# Patient Record
Sex: Female | Born: 1965 | Race: Black or African American | Hispanic: No | Marital: Single | Smoking: Never smoker
Health system: Southern US, Community
[De-identification: ages and names within clinical notes are randomized; demographics above are authoritative.]

## PROBLEM LIST (undated history)

## (undated) DIAGNOSIS — IMO0001 Reserved for inherently not codable concepts without codable children: Secondary | ICD-10-CM

## (undated) DIAGNOSIS — G9332 Myalgic encephalomyelitis/chronic fatigue syndrome: Secondary | ICD-10-CM

## (undated) DIAGNOSIS — H539 Unspecified visual disturbance: Secondary | ICD-10-CM

## (undated) DIAGNOSIS — M7989 Other specified soft tissue disorders: Secondary | ICD-10-CM

## (undated) DIAGNOSIS — M2550 Pain in unspecified joint: Secondary | ICD-10-CM

## (undated) DIAGNOSIS — M79606 Pain in leg, unspecified: Secondary | ICD-10-CM

## (undated) DIAGNOSIS — M549 Dorsalgia, unspecified: Secondary | ICD-10-CM

## (undated) DIAGNOSIS — I10 Essential (primary) hypertension: Secondary | ICD-10-CM

## (undated) DIAGNOSIS — E7800 Pure hypercholesterolemia, unspecified: Secondary | ICD-10-CM

## (undated) DIAGNOSIS — K769 Liver disease, unspecified: Secondary | ICD-10-CM

## (undated) DIAGNOSIS — F32A Depression, unspecified: Secondary | ICD-10-CM

## (undated) DIAGNOSIS — G4730 Sleep apnea, unspecified: Secondary | ICD-10-CM

## (undated) HISTORY — DX: Sleep apnea, unspecified: G47.30

## (undated) HISTORY — PX: KNEE SURGERY: SHX244

## (undated) HISTORY — DX: Myalgic encephalomyelitis/chronic fatigue syndrome: G93.32

## (undated) HISTORY — DX: Unspecified visual disturbance: H53.9

## (undated) HISTORY — DX: Other specified soft tissue disorders: M79.89

## (undated) HISTORY — DX: Depression, unspecified: F32.A

## (undated) HISTORY — DX: Dorsalgia, unspecified: M54.9

## (undated) HISTORY — DX: Pain in unspecified joint: M25.50

## (undated) HISTORY — DX: Pure hypercholesterolemia, unspecified: E78.00

## (undated) HISTORY — PX: BREAST SURGERY: SHX581

## (undated) HISTORY — DX: Liver disease, unspecified: K76.9

## (undated) HISTORY — PX: INCONTINENCE SURGERY: SHX676

---

## 2009-09-08 ENCOUNTER — Emergency Department (HOSPITAL_BASED_OUTPATIENT_CLINIC_OR_DEPARTMENT_OTHER): Admission: EM | Admit: 2009-09-08 | Discharge: 2009-09-08 | Payer: Self-pay | Admitting: Emergency Medicine

## 2009-09-09 ENCOUNTER — Inpatient Hospital Stay (HOSPITAL_COMMUNITY): Admission: AD | Admit: 2009-09-09 | Discharge: 2009-09-09 | Payer: Self-pay | Admitting: Obstetrics & Gynecology

## 2009-09-09 ENCOUNTER — Ambulatory Visit: Payer: Self-pay | Admitting: Physician Assistant

## 2009-12-06 ENCOUNTER — Emergency Department (HOSPITAL_BASED_OUTPATIENT_CLINIC_OR_DEPARTMENT_OTHER): Admission: EM | Admit: 2009-12-06 | Discharge: 2009-12-06 | Payer: Self-pay | Admitting: Emergency Medicine

## 2009-12-06 ENCOUNTER — Ambulatory Visit: Payer: Self-pay | Admitting: Diagnostic Radiology

## 2010-07-05 LAB — BASIC METABOLIC PANEL
BUN: 15 mg/dL (ref 6–23)
Calcium: 8.7 mg/dL (ref 8.4–10.5)
Chloride: 107 mEq/L (ref 96–112)
Creatinine, Ser: 1.4 mg/dL — ABNORMAL HIGH (ref 0.4–1.2)
Sodium: 141 mEq/L (ref 135–145)

## 2010-07-08 LAB — URINALYSIS, ROUTINE W REFLEX MICROSCOPIC
Glucose, UA: NEGATIVE mg/dL
Ketones, ur: NEGATIVE mg/dL
Protein, ur: NEGATIVE mg/dL
Urobilinogen, UA: 0.2 mg/dL (ref 0.0–1.0)

## 2010-07-08 LAB — URINE MICROSCOPIC-ADD ON

## 2010-07-08 LAB — WET PREP, GENITAL: Yeast Wet Prep HPF POC: NONE SEEN

## 2010-07-08 LAB — PREGNANCY, URINE: Preg Test, Ur: NEGATIVE

## 2010-07-08 LAB — GC/CHLAMYDIA PROBE AMP, GENITAL: Chlamydia, DNA Probe: NEGATIVE

## 2011-07-31 ENCOUNTER — Other Ambulatory Visit: Payer: Self-pay | Admitting: Family Medicine

## 2011-07-31 DIAGNOSIS — Z1231 Encounter for screening mammogram for malignant neoplasm of breast: Secondary | ICD-10-CM

## 2011-08-19 ENCOUNTER — Ambulatory Visit
Admission: RE | Admit: 2011-08-19 | Discharge: 2011-08-19 | Disposition: A | Discharge status: Home / Self Care | Payer: PRIVATE HEALTH INSURANCE | Source: Ambulatory Visit | Attending: Family Medicine | Admitting: Family Medicine

## 2011-08-19 DIAGNOSIS — Z1231 Encounter for screening mammogram for malignant neoplasm of breast: Secondary | ICD-10-CM

## 2011-10-11 ENCOUNTER — Encounter (HOSPITAL_BASED_OUTPATIENT_CLINIC_OR_DEPARTMENT_OTHER): Payer: Self-pay | Admitting: *Deleted

## 2011-10-11 ENCOUNTER — Emergency Department (HOSPITAL_BASED_OUTPATIENT_CLINIC_OR_DEPARTMENT_OTHER)
Admission: EM | Admit: 2011-10-11 | Discharge: 2011-10-11 | Disposition: A | Discharge status: Home / Self Care | Payer: Worker's Compensation | Attending: Emergency Medicine | Admitting: Emergency Medicine

## 2011-10-11 ENCOUNTER — Emergency Department (HOSPITAL_BASED_OUTPATIENT_CLINIC_OR_DEPARTMENT_OTHER): Payer: Worker's Compensation

## 2011-10-11 DIAGNOSIS — IMO0002 Reserved for concepts with insufficient information to code with codable children: Secondary | ICD-10-CM | POA: Insufficient documentation

## 2011-10-11 DIAGNOSIS — W108XXA Fall (on) (from) other stairs and steps, initial encounter: Secondary | ICD-10-CM | POA: Insufficient documentation

## 2011-10-11 DIAGNOSIS — S8390XA Sprain of unspecified site of unspecified knee, initial encounter: Secondary | ICD-10-CM

## 2011-10-11 DIAGNOSIS — I10 Essential (primary) hypertension: Secondary | ICD-10-CM | POA: Insufficient documentation

## 2011-10-11 DIAGNOSIS — Y990 Civilian activity done for income or pay: Secondary | ICD-10-CM | POA: Insufficient documentation

## 2011-10-11 HISTORY — DX: Essential (primary) hypertension: I10

## 2011-10-11 HISTORY — DX: Reserved for inherently not codable concepts without codable children: IMO0001

## 2011-10-11 HISTORY — DX: Pain in leg, unspecified: M79.606

## 2011-10-11 MED ORDER — OXYCODONE-ACETAMINOPHEN 5-325 MG PO TABS
1.0000 | ORAL_TABLET | Freq: Once | ORAL | Status: AC
  Administered 2011-10-11: 1 via ORAL
  Filled 2011-10-11: qty 1

## 2011-10-11 MED ORDER — IBUPROFEN 800 MG PO TABS: 800.0000 mg | ORAL_TABLET | Freq: Three times a day (TID) | ORAL | Status: AC | PRN

## 2011-10-11 MED ORDER — IBUPROFEN 800 MG PO TABS
800.0000 mg | ORAL_TABLET | Freq: Once | ORAL | Status: AC
  Administered 2011-10-11: 800 mg via ORAL
  Filled 2011-10-11: qty 1

## 2011-10-11 MED ORDER — OXYCODONE-ACETAMINOPHEN 5-325 MG PO TABS: 1.0000 | ORAL_TABLET | Freq: Once | ORAL | Status: DC

## 2011-10-11 MED ORDER — IBUPROFEN 800 MG PO TABS: 800.0000 mg | ORAL_TABLET | Freq: Once | ORAL | Status: DC

## 2011-10-11 MED ORDER — OXYCODONE-ACETAMINOPHEN 5-325 MG PO TABS: 1.0000 | ORAL_TABLET | Freq: Four times a day (QID) | ORAL | Status: AC | PRN

## 2011-10-11 NOTE — Discharge Instructions (Signed)
Knee Sprain  You have a knee sprain. Sprains are painful injuries to the joints. A sprain is a partial or complete tearing of ligaments. Ligaments are tough, fibrous tissues that hold bones together at the joints. A strain (sprain) has occurred when a ligament is stretched or damaged. This injury may take several weeks to heal. This is often the same length of time as a bone fracture (break in bone) takes to heal. Even though a fracture (bone break) may not have occurred, the recovery times may be similar.  HOME CARE INSTRUCTIONS   · Rest the injured area for as long as directed by your caregiver. Then slowly start using the joint as directed by your caregiver and as the pain allows. Use crutches as directed. If the knee was splinted or casted, continue use and care as directed. If an ace bandage has been applied today, it should be removed and reapplied every 3 to 4 hours. It should not be applied tightly, but firmly enough to keep swelling down. Watch toes and feet for swelling, bluish discoloration, coldness, numbness or excessive pain. If any of these symptoms occur, remove the ace bandage and reapply more loosely.If these symptoms persist, seek medical attention.  · For the first 24 hours, lie down. Keep the injured extremity elevated on two pillows.  · Apply ice to the injured area for 15 to 20 minutes every couple hours. Repeat this 3 to 4 times per day for the first 48 hours. Put the ice in a plastic bag and place a towel between the bag of ice and your skin.  · Wear any splinting, casting, or elastic bandage applications as instructed.  · Only take over-the-counter or prescription medicines for pain, discomfort, or fever as directed by your caregiver. Do not use aspirin immediately after the injury unless instructed by your caregiver. Aspirin can cause increased bleeding and bruising of the tissues.  · If you were given crutches, continue to use them as instructed. Do not resume weight bearing on the  affected extremity until instructed.  Persistent pain and inability to use the injured area as directed for more than 2 to 3 days are warning signs. If this happens you should see a caregiver for a follow-up visit as soon as possible. Initially, a hairline fracture (this is the same as a broken bone) may not be evident on x-rays. Persistent pain and swelling indicate that further evaluation, non-weight bearing (use of crutches as instructed), and/or further x-rays are indicated. X-rays may sometimes not show a small fracture until a week or ten days later. Make a follow-up appointment with your own caregiver or one to whom we have referred you. A radiologist (specialist in reading x-rays) may re-read your X-rays. Make sure you know how you are to get your x-ray results. Do not assume everything is normal if you do not hear from us.  SEEK MEDICAL CARE IF:   · Bruising, swelling, or pain increases.  · You have cold or numb toes  · You have continuing difficulty or pain with walking.  SEEK IMMEDIATE MEDICAL CARE IF:   · Your toes are cold, numb or blue.  · The pain is not responding to medications and continues to stay the same or get worse.  MAKE SURE YOU:   · Understand these instructions.  · Will watch your condition.  · Will get help right away if you are not doing well or get worse.  Document Released: 04/07/2005 Document Revised: 03/27/2011 Document Reviewed: 03/22/2007    [employer]® Patient Information ©2012 [employer].

## 2011-10-11 NOTE — ED Provider Notes (Signed)
History     CSN: [id number]  Arrival date & time 10/11/11  1318   First MD Initiated Contact with Patient 10/11/11 1341      Chief Complaint  Patient presents with  . Fall    (Consider location/radiation/quality/duration/timing/severity/associated sxs/prior treatment) HPI Pt reports she fell at work early this morning, slipped on some water and fell down 3 steps. She denies head injury or LOC, but states she injured her L arm and leg. She went to sleep when she got home from work and woke up with moderate aching pain in L knee and L arm, worse with movement. No neck or back pain.   Past Medical History  Diagnosis Date  . Hypertension   . Leg pain   . Nerves     Past Surgical History  Procedure Date  . Breast surgery   . Knee surgery     History reviewed. No pertinent family history.  History  Substance Use Topics  . Smoking status: Never Smoker   . Smokeless tobacco: Not on file  . Alcohol Use: No    OB History    Grav Para Term Preterm Abortions TAB SAB Ect Mult Living                  Review of Systems All other systems reviewed and are negative except as noted in HPI.   Allergies  Review of patient's allergies indicates no known allergies.  Home Medications   Current Outpatient Rx  Name Route Sig Dispense Refill  . GABAPENTIN 300 MG PO CAPS Oral Take 300 mg by mouth 3 (three) times daily.    Marland Kitchen LISINOPRIL 10 MG PO TABS Oral Take 10 mg by mouth daily.      BP 120/70  Pulse 68  Temp 98.1 F (36.7 C) (Oral)  Resp 20  Ht 5\' 6"  (1.676 m)  Wt 283 lb (128.368 kg)  BMI 45.68 kg/m2  SpO2 100%  LMP 10/08/2011  Physical Exam  Nursing note and vitals reviewed. Constitutional: She is oriented to person, place, and time. She appears well-developed and well-nourished.  HENT:  Head: Normocephalic and atraumatic.  Eyes: EOM are normal. Pupils are equal, round, and reactive to light.  Neck: Normal range of motion. Neck supple.       nontender to midline    Cardiovascular: Normal rate, normal heart sounds and intact distal pulses.   Pulmonary/Chest: Effort normal and breath sounds normal.  Abdominal: Bowel sounds are normal. She exhibits no distension. There is no tenderness.  Musculoskeletal: She exhibits tenderness. She exhibits no edema.       LUE with no evidence of bony injury, FROM, no swelling or bruising; L knee is tender to palpation with decreased ROM.   Neurological: She is alert and oriented to person, place, and time. She has normal strength. No cranial nerve deficit or sensory deficit.  Skin: Skin is warm and dry. No rash noted.  Psychiatric: She has a normal mood and affect.    ED Course  Procedures (including critical care time)  Labs Reviewed - No data to display Dg Knee Complete 4 Views Left  10/11/2011  *RADIOLOGY REPORT*  Clinical Data: Fall with left knee pain.  LEFT KNEE - COMPLETE 4+ VIEW  Comparison: None  Findings: No evidence of acute fracture, subluxation or dislocation identified.  No joint effusion noted.  No radio-opaque foreign bodies are present.  No focal bony lesions are noted.  The joint spaces are unremarkable.  IMPRESSION: No acute abnormality.  Original Report Authenticated By: Rosendo Gros, M.D.     No diagnosis found.    MDM  Xray as above, neg for fracture. Will attempt to fit knee immobilizer and if not will ACE wrap. Advised pain meds, rest PCP followup.         Maelyn Berrey B. Bernette Mayers, MD 10/11/11 1459

## 2011-10-11 NOTE — ED Notes (Signed)
Pt states she fell down 3 steps at work. C/O pain on left side.

## 2011-11-07 ENCOUNTER — Encounter (HOSPITAL_BASED_OUTPATIENT_CLINIC_OR_DEPARTMENT_OTHER): Payer: Self-pay | Admitting: *Deleted

## 2011-11-07 ENCOUNTER — Emergency Department (HOSPITAL_BASED_OUTPATIENT_CLINIC_OR_DEPARTMENT_OTHER): Payer: Worker's Compensation

## 2011-11-07 ENCOUNTER — Emergency Department (HOSPITAL_BASED_OUTPATIENT_CLINIC_OR_DEPARTMENT_OTHER)
Admission: EM | Admit: 2011-11-07 | Discharge: 2011-11-07 | Disposition: A | Discharge status: Home / Self Care | Payer: Worker's Compensation | Attending: Emergency Medicine | Admitting: Emergency Medicine

## 2011-11-07 DIAGNOSIS — I10 Essential (primary) hypertension: Secondary | ICD-10-CM | POA: Insufficient documentation

## 2011-11-07 DIAGNOSIS — M25559 Pain in unspecified hip: Secondary | ICD-10-CM | POA: Insufficient documentation

## 2011-11-07 MED ORDER — IBUPROFEN 800 MG PO TABS
800.0000 mg | ORAL_TABLET | Freq: Once | ORAL | Status: AC
  Administered 2011-11-07: 800 mg via ORAL
  Filled 2011-11-07: qty 1

## 2011-11-07 NOTE — ED Notes (Signed)
Left hip pain and swelling in her left foot.

## 2011-11-07 NOTE — ED Provider Notes (Signed)
History     CSN: [id number]  Arrival date & time 11/07/11  1706   First MD Initiated Contact with Patient 11/07/11 1816      Chief Complaint  Patient presents with  . Hip Pain    (Consider location/radiation/quality/duration/timing/severity/associated sxs/prior treatment) HPI Patient with left hip pain. She has had noted left hip pain after an injury to her left knee several weeks ago. She had an immobilizer on her knee and has been walking with a limp and has now noted left hip pain over the past week and a half. It occurs daily. It is painful with certain movements. It is not swollen and there is no erythema or other injury. She states that she has had some swelling of her left foot over the past 24 hours per Past Medical History  Diagnosis Date  . Hypertension   . Leg pain   . Nerves     Past Surgical History  Procedure Date  . Breast surgery   . Knee surgery     No family history on file.  History  Substance Use Topics  . Smoking status: Never Smoker   . Smokeless tobacco: Not on file  . Alcohol Use: No    OB History    Grav Para Term Preterm Abortions TAB SAB Ect Mult Living                  Review of Systems  All other systems reviewed and are negative.    Allergies  Review of patient's allergies indicates no known allergies.  Home Medications   Current Outpatient Rx  Name Route Sig Dispense Refill  . ASCORBIC ACID 500 MG PO TABS Oral Take 500 mg by mouth daily.    Marland Kitchen BIOTIN 10 MG PO CAPS Oral Take 1 tablet by mouth daily.    Marland Kitchen GABAPENTIN 300 MG PO CAPS Oral Take 300 mg by mouth 3 (three) times daily.    . IBUPROFEN 200 MG PO TABS Oral Take 400 mg by mouth every 6 (six) hours as needed. For pain.    Marland Kitchen LISINOPRIL 10 MG PO TABS Oral Take 10 mg by mouth daily.      BP 135/79  Pulse 78  Temp 98.4 F (36.9 C) (Oral)  Resp 20  SpO2 100%  LMP 10/31/2011  Physical Exam  Nursing note and vitals reviewed. Constitutional: She appears well-developed  and well-nourished.  HENT:  Head: Normocephalic and atraumatic.  Eyes: Conjunctivae and EOM are normal. Pupils are equal, round, and reactive to light.  Neck: Normal range of motion. Neck supple.  Cardiovascular: Normal rate, regular rhythm, normal heart sounds and intact distal pulses.   Pulmonary/Chest: Effort normal and breath sounds normal.  Abdominal: Soft. Bowel sounds are normal.  Musculoskeletal: Normal range of motion. She exhibits no edema.       Some tenderness over left hip with palpation but full active range of motion of left hip joint. There is no erythema or induration noted. Left lower extremity and foot without any signs of edema equal to the right lower extremity. There is no tenderness or cords medially in the lower extremity posterior to the knee or in the medial aspect of the left side.  Neurological: She is alert.  Skin: Skin is warm and dry.  Psychiatric: She has a normal mood and affect. Thought content normal.    ED Course  Procedures (including critical care time)  Labs Reviewed - No data to display Dg Hip Complete Left  11/07/2011  *  RADIOLOGY REPORT*  Clinical Data: Left hip pain since a fall 1 month ago.  LEFT HIP - COMPLETE 2+ VIEW  Comparison: None.  Findings: Osseous structures of the left hip are normal.  No joint space narrowing or osteophyte formation.  The patient has sclerosis at the sacroiliac joints and symphysis pubis, probably related childbirth.  IMPRESSION: Normal left hip.  Original Report Authenticated By: Gwynn Burly, M.D.     No diagnosis found.    MDM   Left hip pain appears musculoskeletal in nature. There is no history of direct trauma to this area. There is no apparent swelling of the left foot no clinical findings consistent with DVT. Patient is advised anti-inflammatory medicine and is given referral to follow with Dr. Pearletha Forge.        Hilario Quarry, MD 11/07/11 2020

## 2011-11-14 ENCOUNTER — Ambulatory Visit (INDEPENDENT_AMBULATORY_CARE_PROVIDER_SITE_OTHER): Payer: Worker's Compensation | Admitting: Family Medicine

## 2011-11-14 VITALS — BP 112/71 | HR 68 | Temp 97.9°F | Ht 66.0 in | Wt 283.0 lb

## 2011-11-14 DIAGNOSIS — M545 Low back pain: Secondary | ICD-10-CM

## 2011-11-14 DIAGNOSIS — M79609 Pain in unspecified limb: Secondary | ICD-10-CM

## 2011-11-14 DIAGNOSIS — M79605 Pain in left leg: Secondary | ICD-10-CM

## 2011-11-14 MED ORDER — CYCLOBENZAPRINE HCL 5 MG PO TABS: 5.0000 mg | ORAL_TABLET | Freq: Three times a day (TID) | ORAL | Status: AC | PRN

## 2011-11-14 MED ORDER — MELOXICAM 15 MG PO TABS: 15.0000 mg | ORAL_TABLET | Freq: Every day | ORAL | Status: DC

## 2011-11-14 MED ORDER — PREDNISONE (PAK) 10 MG PO TABS: ORAL_TABLET | ORAL | Status: DC

## 2011-11-14 NOTE — Patient Instructions (Addendum)
You have lumbar radiculopathy (a pinched nerve in your low back). A prednisone dose pack is the best option for immediate relief and may be prescribed with transition to an anti-inflammatory like meloxicam (if you do not have stomach or kidney issues). Start meloxicam day AFTER finishing prednisone. Vicodin as needed for severe pain (no driving on this medicine). Flexeril as needed for muscle spasms (no driving on this medicine if it makes you sleepy). Continue neurontin as you have been - there's room to go up on this medicine also. Stay as active as possible. Start home stretches (pick 3-5 to do every day for next 6 weeks). Physical therapy has been shown to be helpful as well. Strengthening of low back muscles, abdominal musculature are key for long term pain relief. If not improving, will consider further imaging (x-rays and MRI). Follow up with me in 1 month.

## 2011-11-17 ENCOUNTER — Encounter: Payer: Self-pay | Admitting: Family Medicine

## 2011-11-17 DIAGNOSIS — M79605 Pain in left leg: Secondary | ICD-10-CM | POA: Insufficient documentation

## 2011-11-17 NOTE — Progress Notes (Signed)
Subjective:    Patient ID: Kristen Woods, female    DOB: 23-Mar-1966, 46 y.o.   MRN: [id number]  PCP: [facility]  HPI 46 yo F here for left leg pain.  Patient reports she has a history of left side problems in the past. Then on October 11, 2011 she fell down stairs at work and landed on her left side. This worsened pain in left side, leg, knee. Pain radiates from foot throughout leg mostly posteriorly Associated with some left sided low back pain. No bowel/bladder dysfunction. Does have numbness/tingling with this. Had MRI about 6 years ago of left knee - we do not have access to these results however. Left leg gives out at times. Taking neurontin 300 tid. Feels like left hip laterally and posteriorly 'locks up.' No MRI or injections of back - did have injection of left knee though.  Past Medical History  Diagnosis Date  . Hypertension   . Leg pain   . Nerves     Current Outpatient Prescriptions on File Prior to Visit  Medication Sig Dispense Refill  . ascorbic acid (VITAMIN C) 500 MG tablet Take 500 mg by mouth daily.      . Biotin 10 MG CAPS Take 1 tablet by mouth daily.      Marland Kitchen gabapentin (NEURONTIN) 300 MG capsule Take 300 mg by mouth 3 (three) times daily.      Marland Kitchen lisinopril (PRINIVIL,ZESTRIL) 10 MG tablet Take 10 mg by mouth daily.        Past Surgical History  Procedure Date  . Breast surgery   . Knee surgery     No Known Allergies  History   Social History  . Marital Status: Single    Spouse Name: N/A    Number of Children: N/A  . Years of Education: N/A   Occupational History  . Not on file.   Social History Main Topics  . Smoking status: Never Smoker   . Smokeless tobacco: Not on file  . Alcohol Use: No  . Drug Use: No  . Sexually Active:    Other Topics Concern  . Not on file   Social History Narrative  . No narrative on file    Family History  Problem Relation Age of Onset  . Hypertension Mother   . Diabetes Father   . Heart  attack Neg Hx   . Sudden death Neg Hx   . Hyperlipidemia Other     BP 112/71  Pulse 68  Temp 97.9 F (36.6 C) (Oral)  Ht 5\' 6"  (1.676 m)  Wt 283 lb (128.368 kg)  BMI 45.68 kg/m2  LMP 10/31/2011  Review of Systems See HPI above.    Objective:   Physical Exam Gen: NAD  Back: No gross deformity, scoliosis. TTP mild left paraspinal > right paraspinal lumbar region and left buttock.  No midline or bony TTP.  Some pain with light touch LLE. FROM with pain on flexion and extension (mild). Strength LLE 4/5 with hip flexion, knee flexion/extension, ankle dorsiflexion and plantarflexion.  5/5 all muscle groups RLE.   2+ MSRs in patellar and achilles tendons, equal bilaterally. Negative SLRs - more stretch on left side. Sensation diminished to light touch on left compared to right. Negative logroll bilateral hips Negative fabers and piriformis stretches.  L knee: No gross deformity, ecchymoses, effusion. Diffuse TTP about knee, lower leg. FROM. Negative ant/post drawers. Negative valgus/varus testing. Negative lachmanns. Negative mcmurrays, apleys (both cause pain anterior knee but no click), patellar apprehension.  NV intact distally.     Assessment & Plan:  1. Left leg pain - History and exam most consistent with acute on chronic lumbar radiculopathy.  Distribution of pain throughout entire left lower extremity and pain with light touch may indicate some non-anatomic pain.  Will start with prednisone dose pack and transition to meloxicam. Vicodin as needed severe pain (#60 prescribed q6h prn), flexeril as needed for spasms.  Continue neurontin - keep f/u with PCP for this to continue titrating.  Consider further imaging, PT in future.  Start HEP.  F/u in 1 month for reevaluation.

## 2011-11-17 NOTE — Assessment & Plan Note (Signed)
History and exam most consistent with acute on chronic lumbar radiculopathy.  Distribution of pain throughout entire left lower extremity and pain with light touch may indicate some non-anatomic pain.  Will start with prednisone dose pack and transition to meloxicam. Vicodin as needed severe pain (#60 prescribed q6h prn), flexeril as needed for spasms.  Continue neurontin - keep f/u with PCP for this to continue titrating.  Consider further imaging, PT in future.  Start HEP.  F/u in 1 month for reevaluation.

## 2011-12-15 ENCOUNTER — Ambulatory Visit: Payer: Self-pay | Admitting: Family Medicine

## 2011-12-19 ENCOUNTER — Ambulatory Visit: Payer: Self-pay | Admitting: Family Medicine

## 2012-02-05 ENCOUNTER — Encounter: Payer: Self-pay | Admitting: Family Medicine

## 2012-02-05 ENCOUNTER — Ambulatory Visit (INDEPENDENT_AMBULATORY_CARE_PROVIDER_SITE_OTHER): Payer: Worker's Compensation | Admitting: Family Medicine

## 2012-02-05 VITALS — BP 103/73 | HR 75 | Ht 66.0 in | Wt 280.0 lb

## 2012-02-05 DIAGNOSIS — M25539 Pain in unspecified wrist: Secondary | ICD-10-CM

## 2012-02-05 DIAGNOSIS — M79605 Pain in left leg: Secondary | ICD-10-CM

## 2012-02-05 DIAGNOSIS — M25531 Pain in right wrist: Secondary | ICD-10-CM

## 2012-02-05 DIAGNOSIS — M79609 Pain in unspecified limb: Secondary | ICD-10-CM

## 2012-02-05 NOTE — Patient Instructions (Addendum)
You have lumbar radiculopathy (a pinched nerve in your low back). Meloxicam 15mg  daily with food for pain and inflammation. Percocet as needed for severe pain - we will taper down from this if you need pain medicine when this runs out. Flexeril as needed for muscle spasms. Start formal physical therapy for this and your knee - we will have to run this through your worker's comp. Consider wrist brace for right wrist - wear at nighttime and as much as possible during the day. Follow up with me in 1 month to 6 weeks for reevaluation.

## 2012-02-09 ENCOUNTER — Encounter: Payer: Self-pay | Admitting: Family Medicine

## 2012-02-09 DIAGNOSIS — M25531 Pain in right wrist: Secondary | ICD-10-CM | POA: Insufficient documentation

## 2012-02-09 NOTE — Assessment & Plan Note (Signed)
History and exam most consistent with acute on chronic lumbar radiculopathy.  Distribution of pain throughout entire left lower extremity and pain with light touch (some abnormal withdrawal) may indicate some non-anatomic pain as well.  Not much help with prednisone - will not repeat dose pack.  Continue meloxicam, percocet (#60 prescribed, no refills - will have to taper from this if refill needed), flexeril for spasms.  Start formal physical therapy as this is most likely to help her - wrist for radiculopathy and left knee pain though feel left knee pain is most likely accounted for by radiculopathy.  Continue neurontin - keep f/u with PCP for this to continue titrating.  F/u in 1 month to 6 weeks.  IF not improving consider Lumbar spine MRI.

## 2012-02-09 NOTE — Progress Notes (Addendum)
Subjective:    Patient ID: Kristen Woods, female    DOB: Jul 04, 1965, 46 y.o.   MRN: [id number]  PCP: [facility]  Knee Pain    46 yo F here for f/u left leg pain.  7/26: Patient reports she has a history of left side problems in the past. Then on October 11, 2011 she fell down stairs at work and landed on her left side. This worsened pain in left side, leg, knee. Pain radiates from foot throughout leg mostly posteriorly Associated with some left sided low back pain. No bowel/bladder dysfunction. Does have numbness/tingling with this. Had MRI about 6 years ago of left knee - we do not have access to these results however. Left leg gives out at times. Taking neurontin 300 tid. Feels like left hip laterally and posteriorly 'locks up.' No MRI or injections of back - did have injection of left knee though.  10/17: Patient returns stating about the same but past 4 days really bad muscle spasms in back and left leg. Left knee feels tight as well. No new injuries since the one in June noted above. 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 States is taking gabapentin, mobic.  Did not notice much benefit with prednisone. A pins sensation occasionally in left leg. Also reports past week started having numbness and pain in fingers of right hand not related to work.  Past Medical History  Diagnosis Date  . Hypertension   . Leg pain   . Nerves     Current Outpatient Prescriptions on File Prior to Visit  Medication Sig Dispense Refill  . ascorbic acid (VITAMIN C) 500 MG tablet Take 500 mg by mouth daily.      . Biotin 10 MG CAPS Take 1 tablet by mouth daily.      Marland Kitchen gabapentin (NEURONTIN) 300 MG capsule Take 300 mg by mouth 3 (three) times daily.      Marland Kitchen lisinopril (PRINIVIL,ZESTRIL) 10 MG tablet Take 10 mg by mouth daily.      . meloxicam (MOBIC) 15 MG tablet Take 1 tablet (15 mg total) by mouth daily. With food.  Start AFTER finishing prednisone.  30 tablet  1  .  predniSONE (STERAPRED UNI-PAK) 10 MG tablet 6 tabs po day 1, 5 tabs po day 2, 4 tabs po day 3, 3 tabs po day 4, 2 tabs po day 5, 1 tab po day 6  21 tablet  0    Past Surgical History  Procedure Date  . Breast surgery   . Knee surgery     No Known Allergies  History   Social History  . Marital Status: Single    Spouse Name: N/A    Number of Children: N/A  . Years of Education: N/A   Occupational History  . Not on file.   Social History Main Topics  . Smoking status: Never Smoker   . Smokeless tobacco: Not on file  . Alcohol Use: No  . Drug Use: No  . Sexually Active: Not on file   Other Topics Concern  . Not on file   Social History Narrative  . No narrative on file    Family History  Problem Relation Age of Onset  . Hypertension Mother   . Diabetes Father   . Heart attack Neg Hx   . Sudden death Neg Hx   . Hyperlipidemia Other     BP 103/73  Pulse 75  Ht 5\' 6"  (1.676 m)  Wt 280 lb (127.007 kg)  BMI 45.19 kg/m2  Review of Systems  See HPI above.    Objective:   Physical Exam  Gen: NAD  Back: No gross deformity, scoliosis. TTP mild left paraspinal > right paraspinal lumbar region and left buttock.  No midline or bony TTP.  Some pain with light touch LLE - some abnormal withdrawal. FROM with pain on flexion and extension (mild). Strength LLE 5/5 all muscle groups, improved compared to last visit.  5/5 all muscle groups RLE.   2+ MSRs in patellar and achilles tendons, equal bilaterally. Negative SLRs - more stretch on left side. Sensation normal bilaterally. Negative logroll bilateral hips Negative fabers and piriformis stretches.  L knee: No gross deformity, ecchymoses, effusion. Diffuse TTP about knee, lower leg. FROM. Negative ant/post drawers. Negative valgus/varus testing. Negative lachmanns. Negative mcmurrays, apleys (both cause pain anterior knee but no click), patellar apprehension. NV intact distally.  R hand/wrist: No gross  deformity, swelling, bruising, atrophy. No focal TTP. FROM all digits, wrist. Decreased sensation throughout hand and fingers. Negative tinels at carpal tunnel and guyons canal.    Assessment & Plan:  1. Left leg pain - History and exam most consistent with acute on chronic lumbar radiculopathy.  Distribution of pain throughout entire left lower extremity and pain with light touch (some abnormal withdrawal) may indicate some non-anatomic pain as well.  Not much help with prednisone - will not repeat dose pack.  Continue meloxicam, percocet (#60 prescribed, no refills - will have to taper from this if refill needed), flexeril for spasms.  Start formal physical therapy as this is most likely to help her - wrist for radiculopathy and left knee pain though feel left knee pain is most likely accounted for by radiculopathy.  Continue neurontin - keep f/u with PCP for this to continue titrating.  F/u in 1 month to 6 weeks.  IF not improving consider Lumbar spine MRI.  2. Right wrist/hand pain - does not fit with a specific nerve distribution.  Will treat conservatively with cockup wrist splint as would treat carpal tunnel or guyon canal compression.  Addendum 11/18:  Patient called requesting pain medication - sent in one time script of tramadol.  She is also due for follow-up appointment.

## 2012-02-09 NOTE — Assessment & Plan Note (Signed)
does not fit with a specific nerve distribution.  Will treat conservatively with cockup wrist splint as would treat carpal tunnel or guyon canal compression.

## 2012-03-08 MED ORDER — TRAMADOL HCL 50 MG PO TABS: 50.0000 mg | ORAL_TABLET | Freq: Three times a day (TID) | ORAL | Status: DC | PRN

## 2012-03-08 NOTE — Addendum Note (Signed)
Addended by: Lenda Kelp on: 03/08/2012 03:50 PM   Modules accepted: Orders

## 2012-03-12 ENCOUNTER — Ambulatory Visit (INDEPENDENT_AMBULATORY_CARE_PROVIDER_SITE_OTHER): Payer: Worker's Compensation | Admitting: Family Medicine

## 2012-03-12 VITALS — BP 135/84 | Ht 66.0 in | Wt 280.0 lb

## 2012-03-12 DIAGNOSIS — M79609 Pain in unspecified limb: Secondary | ICD-10-CM

## 2012-03-12 DIAGNOSIS — M545 Low back pain: Secondary | ICD-10-CM

## 2012-03-12 DIAGNOSIS — M79605 Pain in left leg: Secondary | ICD-10-CM

## 2012-03-12 MED ORDER — GABAPENTIN 100 MG PO CAPS: 300.0000 mg | ORAL_CAPSULE | Freq: Three times a day (TID) | ORAL | Status: DC

## 2012-03-12 NOTE — Patient Instructions (Addendum)
We will increase your gabapentin to 300mg  three times a day - start by taking 300mg  twice a day for a few days then increase to 3x/day. Take tramadol as needed for pain, flexeril as needed for spasms. If they call you for physical therapy you can start this. We will order an MRI on your lumbar spine but this also has to go through worker's comp. If you don't hear from me within a couple days of you having your MRI, call me - sometimes I won't get the report when it's done this way and we can then track it down.

## 2012-03-22 ENCOUNTER — Encounter: Payer: Self-pay | Admitting: Family Medicine

## 2012-03-22 NOTE — Progress Notes (Addendum)
Subjective:    Patient ID: Kristen Woods, female    DOB: [birth year], 46 y.o.   MRN: [id number]  PCP: [facility]  Leg Pain   Knee Pain    46 yo F here for f/u left leg pain.  7/26: Patient reports she has a history of left side problems in the past. Then on October 11, 2011 she fell down stairs at work and landed on her left side. This worsened pain in left side, leg, knee. Pain radiates from foot throughout leg mostly posteriorly Associated with some left sided low back pain. No bowel/bladder dysfunction. Does have numbness/tingling with this. Had MRI about 6 years ago of left knee - we do not have access to these results however. Left leg gives out at times. Taking neurontin 300 tid. Feels like left hip laterally and posteriorly 'locks up.' No MRI or injections of back - did have injection of left knee though.  10/17: Patient returns stating about the same but past 4 days really bad muscle spasms in back and left leg. Left knee feels tight as well. No new injuries since the one in June noted above. 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 States is taking gabapentin, mobic.  Did not notice much benefit with prednisone. A pins sensation occasionally in left leg. Also reports past week started having numbness and pain in fingers of right hand not related to work.  11/22: Patient currently is taking gabapentin 300mg  a day, mobic as needed. As noted previously, not much benefit with prednisone. Has still not heard from workers comp on physical therapy. Taking tramadol as needed. Severe spasms left side of low back down left leg. Feels like low back locking up at times. Pins sensation noted previously also.  Past Medical History  Diagnosis Date  . Hypertension   . Leg pain   . Nerves     Current Outpatient Prescriptions on File Prior to Visit  Medication Sig Dispense Refill  . ascorbic acid (VITAMIN C) 500 MG tablet Take 500 mg by mouth daily.       . Biotin 10 MG CAPS Take 1 tablet by mouth daily.      Marland Kitchen gabapentin (NEURONTIN) 100 MG capsule Take 3 capsules (300 mg total) by mouth 3 (three) times daily.  810 capsule  1  . lisinopril (PRINIVIL,ZESTRIL) 10 MG tablet Take 10 mg by mouth daily.      . meloxicam (MOBIC) 15 MG tablet Take 1 tablet (15 mg total) by mouth daily. With food.  Start AFTER finishing prednisone.  30 tablet  1  . predniSONE (STERAPRED UNI-PAK) 10 MG tablet 6 tabs po day 1, 5 tabs po day 2, 4 tabs po day 3, 3 tabs po day 4, 2 tabs po day 5, 1 tab po day 6  21 tablet  0  . traMADol (ULTRAM) 50 MG tablet Take 1 tablet (50 mg total) by mouth every 8 (eight) hours as needed for pain.  90 tablet  0    Past Surgical History  Procedure Date  . Breast surgery   . Knee surgery     No Known Allergies  History   Social History  . Marital Status: Single    Spouse Name: N/A    Number of Children: N/A  . Years of Education: N/A   Occupational History  . Not on file.   Social History Main Topics  . Smoking status: Never Smoker   . Smokeless tobacco: Not  on file  . Alcohol Use: No  . Drug Use: No  . Sexually Active: Not on file   Other Topics Concern  . Not on file   Social History Narrative  . No narrative on file    Family History  Problem Relation Age of Onset  . Hypertension Mother   . Diabetes Father   . Heart attack Neg Hx   . Sudden death Neg Hx   . Hyperlipidemia Other     BP 135/84  Ht 5\' 6"  (1.676 m)  Wt 280 lb (127.007 kg)  BMI 45.19 kg/m2  Review of Systems  See HPI above.    Objective:   Physical Exam  Gen: NAD  Back: No gross deformity, scoliosis. TTP mild left paraspinal > right paraspinal lumbar region and left buttock.  No midline or bony TTP.   FROM with pain on flexion and extension (mild). Strength LLE 5/5 all muscle groups.  5/5 all muscle groups RLE.   2+ MSRs in patellar and achilles tendons, equal bilaterally. Mild positive SLR on left, negative  right. Sensation normal bilaterally. Negative logroll bilateral hips Negative fabers and piriformis stretches.  L knee: No gross deformity, ecchymoses, effusion. Diffuse TTP about knee, lower leg. FROM. Negative ant/post drawers. Negative valgus/varus testing. Negative lachmanns. Negative mcmurrays, apleys, patellar apprehension. NV intact distally.     Assessment & Plan:  1. Back/Left leg pain - Still believe history and exam most consistent with acute on chronic lumbar radiculopathy.  Has persisted despite HEP, prednisone, meloxicam, pain medication, flexeril.  Worsening recently as well.  Advised we go ahead with MRI lumbar spine to further assess, consider ESIs, PT, surgical intervention depending on results.  Increase neurontin to 300 tid.  Addendum 1/9:  Spoke with patient to see how she's doing.  She reports some improvement since our last discussion.  MRI was normal of her lumbar spine suggesting more piriformis, sciatica as cause of her pain.  Has not heard yet about physical therapy from worker's comp so will check on this.  Also advised to continue working with her PCP on titrating up neurontin.

## 2012-03-22 NOTE — Assessment & Plan Note (Signed)
Still believe history and exam most consistent with acute on chronic lumbar radiculopathy.  Has persisted despite HEP, prednisone, meloxicam, pain medication, flexeril.  Worsening recently as well.  Advised we go ahead with MRI lumbar spine to further assess, consider ESIs, PT, surgical intervention depending on results.  Increase neurontin to 300 tid.

## 2012-03-23 ENCOUNTER — Encounter: Payer: Self-pay | Admitting: *Deleted

## 2012-03-23 NOTE — Patient Instructions (Signed)
MRI REQUEST FOR WORKERS COMP WAS FAXED TO [phone]. PHONE NUMBER [phone]. THEY WILL CONTACT PT WITH APPT TIME AND DATE

## 2012-04-03 ENCOUNTER — Ambulatory Visit (HOSPITAL_BASED_OUTPATIENT_CLINIC_OR_DEPARTMENT_OTHER)
Admission: RE | Admit: 2012-04-03 | Discharge: 2012-04-03 | Disposition: A | Discharge status: Home / Self Care | Payer: Worker's Compensation | Source: Ambulatory Visit | Attending: Family Medicine | Admitting: Family Medicine

## 2012-04-03 DIAGNOSIS — M79609 Pain in unspecified limb: Secondary | ICD-10-CM | POA: Insufficient documentation

## 2012-04-03 DIAGNOSIS — R209 Unspecified disturbances of skin sensation: Secondary | ICD-10-CM | POA: Insufficient documentation

## 2012-04-03 DIAGNOSIS — M545 Low back pain, unspecified: Secondary | ICD-10-CM | POA: Insufficient documentation

## 2012-04-03 DIAGNOSIS — M25559 Pain in unspecified hip: Secondary | ICD-10-CM | POA: Insufficient documentation

## 2012-04-03 DIAGNOSIS — M79605 Pain in left leg: Secondary | ICD-10-CM

## 2012-04-19 ENCOUNTER — Encounter (HOSPITAL_BASED_OUTPATIENT_CLINIC_OR_DEPARTMENT_OTHER): Payer: Self-pay

## 2012-04-19 ENCOUNTER — Emergency Department (HOSPITAL_BASED_OUTPATIENT_CLINIC_OR_DEPARTMENT_OTHER)
Admission: EM | Admit: 2012-04-19 | Discharge: 2012-04-19 | Disposition: A | Discharge status: Home / Self Care | Payer: Self-pay | Attending: Emergency Medicine | Admitting: Emergency Medicine

## 2012-04-19 DIAGNOSIS — I10 Essential (primary) hypertension: Secondary | ICD-10-CM | POA: Insufficient documentation

## 2012-04-19 DIAGNOSIS — Z79899 Other long term (current) drug therapy: Secondary | ICD-10-CM | POA: Insufficient documentation

## 2012-04-19 DIAGNOSIS — L539 Erythematous condition, unspecified: Secondary | ICD-10-CM | POA: Insufficient documentation

## 2012-04-19 DIAGNOSIS — E669 Obesity, unspecified: Secondary | ICD-10-CM | POA: Insufficient documentation

## 2012-04-19 DIAGNOSIS — N61 Mastitis without abscess: Secondary | ICD-10-CM | POA: Insufficient documentation

## 2012-04-19 DIAGNOSIS — N611 Abscess of the breast and nipple: Secondary | ICD-10-CM

## 2012-04-19 DIAGNOSIS — Z8669 Personal history of other diseases of the nervous system and sense organs: Secondary | ICD-10-CM | POA: Insufficient documentation

## 2012-04-19 DIAGNOSIS — Z8739 Personal history of other diseases of the musculoskeletal system and connective tissue: Secondary | ICD-10-CM | POA: Insufficient documentation

## 2012-04-19 MED ORDER — LIDOCAINE 4 % EX CREA
TOPICAL_CREAM | CUTANEOUS | Status: AC
  Administered 2012-04-19: 1
  Filled 2012-04-19: qty 5

## 2012-04-19 MED ORDER — MORPHINE SULFATE 4 MG/ML IJ SOLN
4.0000 mg | Freq: Once | INTRAMUSCULAR | Status: AC
  Administered 2012-04-19: 4 mg via INTRAVENOUS
  Filled 2012-04-19: qty 1

## 2012-04-19 MED ORDER — OXYCODONE-ACETAMINOPHEN 5-325 MG PO TABS: 1.0000 | ORAL_TABLET | Freq: Four times a day (QID) | ORAL | Status: DC | PRN

## 2012-04-19 MED ORDER — LIDOCAINE-PRILOCAINE 2.5-2.5 % EX CREA
TOPICAL_CREAM | Freq: Once | CUTANEOUS | Status: AC
  Administered 2012-04-19: 21:00:00 via TOPICAL
  Filled 2012-04-19: qty 5

## 2012-04-19 MED ORDER — LIDOCAINE HCL 2 % IJ SOLN
INTRAMUSCULAR | Status: AC
  Administered 2012-04-19: 400 mg
  Filled 2012-04-19: qty 20

## 2012-04-19 MED ORDER — SULFAMETHOXAZOLE-TRIMETHOPRIM 800-160 MG PO TABS: 1.0000 | ORAL_TABLET | Freq: Two times a day (BID) | ORAL | Status: AC

## 2012-04-19 MED ORDER — LIDOCAINE-EPINEPHRINE 2 %-1:100000 IJ SOLN
20.0000 mL | Freq: Once | INTRAMUSCULAR | Status: AC
  Administered 2012-04-19: 20 mL via INTRADERMAL

## 2012-04-19 NOTE — ED Notes (Signed)
Swelling, erythema and pain noted to lateral aspect of left breast x 4 days.

## 2012-04-19 NOTE — ED Notes (Signed)
Left axilla abscess x 4 days

## 2012-04-19 NOTE — ED Notes (Signed)
MD at bedside for I and D

## 2012-04-19 NOTE — ED Provider Notes (Signed)
History     CSN: [id number]  Arrival date & time 04/19/12  1658   First MD Initiated Contact with Patient 04/19/12 1826      Chief Complaint  Patient presents with  . Abscess    (Consider location/radiation/quality/duration/timing/severity/associated sxs/prior treatment) HPI Comments: Pt presents with 4 days of painful left breast redness and swelling.  Patient is a 46 y.o. female presenting with abscess. The history is provided by the patient. No language interpreter was used.  Abscess  This is a new problem. The current episode started less than one week ago (4 days). The onset was gradual. The problem occurs continuously. The problem has been gradually worsening. The abscess is present on the torso (left breast). The problem is severe. The abscess is characterized by painfulness, redness and swelling. The abscess first occurred at home. Pertinent negatives include no anorexia, no decrease in physical activity, not sleeping less, not drinking less, no fever, no diarrhea, no congestion, no rhinorrhea, no decreased responsiveness and no cough. Her past medical history does not include skin abscesses in family. There were no sick contacts.    Past Medical History  Diagnosis Date  . Hypertension   . Leg pain   . Nerves     Past Surgical History  Procedure Date  . Breast surgery   . Knee surgery     Family History  Problem Relation Age of Onset  . Hypertension Mother   . Diabetes Father   . Heart attack Neg Hx   . Sudden death Neg Hx   . Hyperlipidemia Other     History  Substance Use Topics  . Smoking status: Never Smoker   . Smokeless tobacco: Not on file  . Alcohol Use: No    OB History    Grav Para Term Preterm Abortions TAB SAB Ect Mult Living                  Review of Systems  Constitutional: Negative for fever and decreased responsiveness.  HENT: Negative for congestion and rhinorrhea.   Respiratory: Negative for cough.   Gastrointestinal: Negative  for diarrhea and anorexia.  All other systems reviewed and are negative.    Allergies  Review of patient's allergies indicates no known allergies.  Home Medications   Current Outpatient Rx  Name  Route  Sig  Dispense  Refill  . ASCORBIC ACID 500 MG PO TABS   Oral   Take 500 mg by mouth daily.         Marland Kitchen BIOTIN 10 MG PO CAPS   Oral   Take 1 tablet by mouth daily.         Marland Kitchen GABAPENTIN 100 MG PO CAPS   Oral   Take 3 capsules (300 mg total) by mouth 3 (three) times daily.   810 capsule   1     If cost is the same, ok to substitute 300mg  capsul ...   . LISINOPRIL 10 MG PO TABS   Oral   Take 10 mg by mouth daily.         . MELOXICAM 15 MG PO TABS   Oral   Take 1 tablet (15 mg total) by mouth daily. With food.  Start AFTER finishing prednisone.   30 tablet   1   . PREDNISONE (PAK) 10 MG PO TABS      6 tabs po day 1, 5 tabs po day 2, 4 tabs po day 3, 3 tabs po day 4, 2 tabs po day  5, 1 tab po day 6   21 tablet   0   . TRAMADOL HCL 50 MG PO TABS   Oral   Take 1 tablet (50 mg total) by mouth every 8 (eight) hours as needed for pain.   90 tablet   0     BP 150/88  Pulse 91  Temp 98.4 F (36.9 C) (Oral)  Resp 16  Ht 5\' 6"  (1.676 m)  Wt 276 lb (125.193 kg)  BMI 44.55 kg/m2  SpO2 100%  LMP 04/09/2012  Physical Exam  Vitals reviewed. Constitutional: She is oriented to person, place, and time. She appears well-developed and well-nourished. No distress.       Pt is obese   HENT:  Head: Normocephalic and atraumatic.  Right Ear: External ear normal.  Left Ear: External ear normal.  Nose: Nose normal.  Mouth/Throat: Oropharynx is clear and moist. No oropharyngeal exudate.  Eyes: Conjunctivae normal are normal. Pupils are equal, round, and reactive to light. Right eye exhibits no discharge. Left eye exhibits no discharge. No scleral icterus.  Neck: Normal range of motion. Neck supple. No JVD present. No tracheal deviation present.  Cardiovascular: Normal  rate, regular rhythm, normal heart sounds and intact distal pulses.  Exam reveals no gallop and no friction rub.   No murmur heard. Pulmonary/Chest: Effort normal and breath sounds normal. No stridor. No respiratory distress. She has no wheezes. She has no rales. She exhibits no tenderness.  Abdominal: Soft. Bowel sounds are normal. There is no tenderness. There is no guarding.  Genitourinary: There is breast swelling and tenderness. No breast discharge or bleeding.       Note and area of erythema along the lateral aspect of left breast with central pointing and fluctuance.  There is no axillary adenopathy.  No nipple discharge.  Musculoskeletal: Normal range of motion. She exhibits no edema.  Lymphadenopathy:    She has no cervical adenopathy.  Neurological: She is alert and oriented to person, place, and time.  Skin: Skin is warm and dry. Lesion noted. No rash noted. She is not diaphoretic. There is erythema. No cyanosis. No pallor. Nails show no clubbing.          See breast exam  Psychiatric: She has a normal mood and affect. Her behavior is normal.    ED Course  INCISION AND DRAINAGE Date/Time: 04/19/2012 8:49 PM Performed by: Dana Allan T Authorized by: Dana Allan T Consent: Verbal consent obtained. Risks and benefits: risks, benefits and alternatives were discussed Consent given by: patient Patient understanding: patient states understanding of the procedure being performed Relevant documents: relevant documents present and verified Site marked: the operative site was marked Required items: required blood products, implants, devices, and special equipment available Patient identity confirmed: verbally with patient Time out: Immediately prior to procedure a "time out" was called to verify the correct patient, procedure, equipment, support staff and site/side marked as required. Type: abscess Body area: trunk Location details: left breast Anesthesia: local infiltration  (field block.) Local anesthetic: lidocaine 1% without epinephrine Anesthetic total: 6 ml Patient sedated: no Scalpel size: 11 Needle gauge: 22 Incision type: elliptical Drainage: purulent and bloody Drainage amount: copious Packing material: 1/4 in iodoform gauze Patient tolerance: Patient tolerated the procedure well with no immediate complications. Comments: After incising abscess, removed lage amount of purulent, foul-smelling material, probed abscess cavity and took down loculations.   (including critical care time)  Labs Reviewed - No data to display No results found.   No  diagnosis found.    MDM  Pt presents for evaluation of a tender swollen area on her left lateral breast consistent with an abscess with surrounding cellulitis.  Will apply EMLA over the ara of induration and erythema and return to perform an I&D.  Note stable VS, NAD  2045.  Pt tolerated procedure without significant discomfort or distress.  Secondary to location and size of the abscess cavity, packing material was placed.  She will return to the ER for removal in 48 hours.  Will start a course of bactrim also and refer for long term follow-up with a local surgeon.       Tobin Chad, MD 04/19/12 2056

## 2012-04-21 ENCOUNTER — Encounter (HOSPITAL_BASED_OUTPATIENT_CLINIC_OR_DEPARTMENT_OTHER): Payer: Self-pay | Admitting: *Deleted

## 2012-04-21 ENCOUNTER — Emergency Department (HOSPITAL_BASED_OUTPATIENT_CLINIC_OR_DEPARTMENT_OTHER)
Admission: EM | Admit: 2012-04-21 | Discharge: 2012-04-21 | Disposition: A | Discharge status: Home / Self Care | Payer: PRIVATE HEALTH INSURANCE | Attending: Emergency Medicine | Admitting: Emergency Medicine

## 2012-04-21 DIAGNOSIS — L089 Local infection of the skin and subcutaneous tissue, unspecified: Secondary | ICD-10-CM | POA: Insufficient documentation

## 2012-04-21 DIAGNOSIS — I10 Essential (primary) hypertension: Secondary | ICD-10-CM | POA: Insufficient documentation

## 2012-04-21 DIAGNOSIS — Z8739 Personal history of other diseases of the musculoskeletal system and connective tissue: Secondary | ICD-10-CM | POA: Insufficient documentation

## 2012-04-21 DIAGNOSIS — Z79899 Other long term (current) drug therapy: Secondary | ICD-10-CM | POA: Insufficient documentation

## 2012-04-21 NOTE — ED Notes (Signed)
Pt seen here Monday and area to left breast was packed. Here to have packing removed. Still having pain

## 2012-04-21 NOTE — ED Provider Notes (Signed)
History/physical exam/procedure(s) were performed by non-physician practitioner and as supervising physician I was immediately available for consultation/collaboration. I have reviewed all notes and am in agreement with care and plan.   Hilario Quarry, MD 04/21/12 1600

## 2012-04-21 NOTE — ED Provider Notes (Signed)
History     CSN: [id number]  Arrival date & time 04/21/12  1459   First MD Initiated Contact with Patient 04/21/12 1514      Chief Complaint  Patient presents with  . Follow-up    (Consider location/radiation/quality/duration/timing/severity/associated sxs/prior treatment) Patient is a 47 y.o. female presenting with rash. The history is provided by the patient. No language interpreter was used.  Rash  This is a new problem.  Pt is here for recheck of abscess.   Pt reports area is still sore    Past Medical History  Diagnosis Date  . Hypertension   . Leg pain   . Nerves     Past Surgical History  Procedure Date  . Breast surgery   . Knee surgery     Family History  Problem Relation Age of Onset  . Hypertension Mother   . Diabetes Father   . Heart attack Neg Hx   . Sudden death Neg Hx   . Hyperlipidemia Other     History  Substance Use Topics  . Smoking status: Never Smoker   . Smokeless tobacco: Not on file  . Alcohol Use: No    OB History    Grav Para Term Preterm Abortions TAB SAB Ect Mult Living                  Review of Systems  Skin: Positive for wound.  All other systems reviewed and are negative.    Allergies  Review of patient's allergies indicates no known allergies.  Home Medications   Current Outpatient Rx  Name  Route  Sig  Dispense  Refill  . ASCORBIC ACID 500 MG PO TABS   Oral   Take 500 mg by mouth daily.         Marland Kitchen BIOTIN 10 MG PO CAPS   Oral   Take 1 tablet by mouth daily.         Marland Kitchen GABAPENTIN 100 MG PO CAPS   Oral   Take 3 capsules (300 mg total) by mouth 3 (three) times daily.   810 capsule   1     If cost is the same, ok to substitute 300mg  capsul ...   . LISINOPRIL 10 MG PO TABS   Oral   Take 10 mg by mouth daily.         . MELOXICAM 15 MG PO TABS   Oral   Take 1 tablet (15 mg total) by mouth daily. With food.  Start AFTER finishing prednisone.   30 tablet   1   . OXYCODONE-ACETAMINOPHEN 5-325 MG PO  TABS   Oral   Take 1 tablet by mouth every 6 (six) hours as needed for pain.   16 tablet   0   . PREDNISONE (PAK) 10 MG PO TABS      6 tabs po day 1, 5 tabs po day 2, 4 tabs po day 3, 3 tabs po day 4, 2 tabs po day 5, 1 tab po day 6   21 tablet   0   . SULFAMETHOXAZOLE-TRIMETHOPRIM 800-160 MG PO TABS   Oral   Take 1 tablet by mouth 2 (two) times daily.   14 tablet   0   . TRAMADOL HCL 50 MG PO TABS   Oral   Take 1 tablet (50 mg total) by mouth every 8 (eight) hours as needed for pain.   90 tablet   0     BP 152/97  Pulse 94  Temp 98.6 F (37 C) (Oral)  Resp 20  Ht 5\' 6"  (1.676 m)  Wt 276 lb (125.193 kg)  BMI 44.55 kg/m2  SpO2 100%  LMP 04/09/2012  Physical Exam  Vitals reviewed. Constitutional: She is oriented to person, place, and time. She appears well-developed and well-nourished.  HENT:  Head: Normocephalic and atraumatic.  Musculoskeletal: She exhibits tenderness.  Neurological: She is alert and oriented to person, place, and time. She has normal reflexes.  Skin: There is erythema.  Psychiatric: She has a normal mood and affect.    ED Course  Procedures (including critical care time)  Labs Reviewed - No data to display No results found.   1. Skin infection       MDM  Pt advised to follow up with general surgeon as advised.        Lonia Skinner [place], [place] 04/21/12 1528  Lonia Skinner [place], [place] 04/21/12 [phone]

## 2012-04-28 ENCOUNTER — Ambulatory Visit (INDEPENDENT_AMBULATORY_CARE_PROVIDER_SITE_OTHER): Payer: Self-pay | Admitting: Surgery

## 2012-05-06 ENCOUNTER — Encounter (INDEPENDENT_AMBULATORY_CARE_PROVIDER_SITE_OTHER): Payer: Self-pay | Admitting: General Surgery

## 2012-05-06 ENCOUNTER — Ambulatory Visit (INDEPENDENT_AMBULATORY_CARE_PROVIDER_SITE_OTHER): Payer: PRIVATE HEALTH INSURANCE | Admitting: General Surgery

## 2012-05-06 VITALS — BP 124/68 | HR 68 | Temp 97.5°F | Resp 18 | Ht 66.0 in | Wt 281.0 lb

## 2012-05-06 DIAGNOSIS — N6089 Other benign mammary dysplasias of unspecified breast: Secondary | ICD-10-CM

## 2012-05-06 NOTE — Patient Instructions (Addendum)
If you decide you would like to have the cyst removed please call the office and we will get this scheduled for you

## 2012-05-06 NOTE — Progress Notes (Signed)
Chief complaint: Cyst, infection left breast  History: Patient is a pleasant 47 year old female who approximately 3 weeks ago developed a painful area of redness and swelling in her lateral left breast. She had noticed a small "white bump" on the skin in that area for some time prior to this. This required incision and drainage in the emergency room. She was placed on antibiotics. She is now back to baseline with just a small skin abnormality. She was referred by Dr. Rosalia Hammers at the emergency room for consideration for excision. She has a history of bilateral breast reduction surgery.  Past Medical History  Diagnosis Date  . Hypertension   . Leg pain   . Nerves    Past Surgical History  Procedure Date  . Breast surgery   . Knee surgery   . Incontinence surgery    Current Outpatient Prescriptions  Medication Sig Dispense Refill  . Biotin 10 MG CAPS Take 1 tablet by mouth daily.      Marland Kitchen gabapentin (NEURONTIN) 100 MG capsule Take 3 capsules (300 mg total) by mouth 3 (three) times daily.  810 capsule  1  . lisinopril (PRINIVIL,ZESTRIL) 10 MG tablet Take 10 mg by mouth daily.      . traMADol (ULTRAM) 50 MG tablet Take 1 tablet (50 mg total) by mouth every 8 (eight) hours as needed for pain.  90 tablet  0  . ascorbic acid (VITAMIN C) 500 MG tablet Take 500 mg by mouth daily.      . meloxicam (MOBIC) 15 MG tablet Take 1 tablet (15 mg total) by mouth daily. With food.  Start AFTER finishing prednisone.  30 tablet  1  . oxyCODONE-acetaminophen (PERCOCET) 5-325 MG per tablet Take 1 tablet by mouth every 6 (six) hours as needed for pain.  16 tablet  0  . predniSONE (STERAPRED UNI-PAK) 10 MG tablet 6 tabs po day 1, 5 tabs po day 2, 4 tabs po day 3, 3 tabs po day 4, 2 tabs po day 5, 1 tab po day 6  21 tablet  0   No Known Allergies Exam: BP 124/68  Pulse 68  Temp 97.5 F (36.4 C)  Resp 18  Ht 5\' 6"  (1.676 m)  Wt 281 lb (127.461 kg)  BMI 45.35 kg/m2  LMP 04/09/2012 General: Obese African American  female alert and comfortable Breasts: Pertinent exam limited to skin of the left breast. She is status post reduction. In the lateral left breast near the axilla is an approximately 1 cm area of subcutaneous skin thickening and an overlying small skin opening without purulent drainage or erythema. This would be consistent with a sebaceous cyst or area of hidradenitis. It is away from her previous incisions.  Assessment and plan: Probable sebaceous cyst versus hidradenitis left breast with recent abscess. I discussed with her options of observation  only in which case she would be at some risk for recurrent abscess versus excision of the area to try to prevent future infections.  She would like to think this over but is leaning toward having area excised. We could do this under local anesthesia only as an outpatient. I discussed risks of bleeding and infection and wound healing problems. She will call us for scheduling if she decides to proceed

## 2012-07-29 ENCOUNTER — Encounter: Payer: Self-pay | Admitting: Family Medicine

## 2012-07-29 ENCOUNTER — Ambulatory Visit (INDEPENDENT_AMBULATORY_CARE_PROVIDER_SITE_OTHER): Payer: Worker's Compensation | Admitting: Family Medicine

## 2012-07-29 VITALS — BP 113/63 | HR 85 | Ht 66.0 in | Wt 263.0 lb

## 2012-07-29 DIAGNOSIS — M545 Low back pain, unspecified: Secondary | ICD-10-CM

## 2012-07-29 DIAGNOSIS — M25569 Pain in unspecified knee: Secondary | ICD-10-CM

## 2012-07-29 DIAGNOSIS — M25562 Pain in left knee: Secondary | ICD-10-CM

## 2012-07-29 MED ORDER — METHYLPREDNISOLONE ACETATE 40 MG/ML IJ SUSP: 40.0000 mg | Freq: Once | INTRAMUSCULAR | Status: DC

## 2012-07-29 MED ORDER — GABAPENTIN 300 MG PO CAPS: 600.0000 mg | ORAL_CAPSULE | Freq: Three times a day (TID) | ORAL | Status: DC

## 2012-07-29 NOTE — Patient Instructions (Addendum)
Your history and exam are consistent with a medial meniscus tear. Trial a cortisone injection today. Do straight leg raises, straight leg raises with foot turned outward, and knee extensions - 3 sets of 10, add ankle weight. Ice 15 minutes at a time 3-4 times a day. Follow up with me in 1 month for reevaluation. Will consider MRI if not improving as expected. Increase your neurontin to 600 mg three times a day - start by increasing the nighttime dose only for 3 days; then increase the midday dose in addition to this for 3 days then finally take 600 mg three times a day.

## 2012-08-02 ENCOUNTER — Encounter: Payer: Self-pay | Admitting: Family Medicine

## 2012-08-02 DIAGNOSIS — M25562 Pain in left knee: Secondary | ICD-10-CM | POA: Insufficient documentation

## 2012-08-02 NOTE — Progress Notes (Signed)
  Subjective:    Patient ID: Kristen Woods, female    DOB: 01-11-1966, 47 y.o.   MRN: [id number]  PCP: [facility]  HPI 47 yo F here for left knee pain.  Patient reports several months of pain on inside of left knee. Knee bothers her more when sitting for prolonged time. Feels like it locks when she goes to stand up. If walking a lot leg feels like it goes outward. Some swelling. Has been icing, taking advil or aleve. More bruising but not around knee joint. No new injuries. Would like to go up on her neurontin (see prior visits). Had x-rays in June 2013 that showed no evidence of arthritis.  Past Medical History  Diagnosis Date  . Hypertension   . Leg pain   . Nerves     Current Outpatient Prescriptions on File Prior to Visit  Medication Sig Dispense Refill  . ascorbic acid (VITAMIN C) 500 MG tablet Take 500 mg by mouth daily.      . Biotin 10 MG CAPS Take 1 tablet by mouth daily.      Marland Kitchen lisinopril (PRINIVIL,ZESTRIL) 10 MG tablet Take 10 mg by mouth daily.       No current facility-administered medications on file prior to visit.    Past Surgical History  Procedure Laterality Date  . Breast surgery    . Knee surgery    . Incontinence surgery      No Known Allergies  History   Social History  . Marital Status: Single    Spouse Name: N/A    Number of Children: N/A  . Years of Education: N/A   Occupational History  . Not on file.   Social History Main Topics  . Smoking status: Never Smoker   . Smokeless tobacco: Not on file  . Alcohol Use: No  . Drug Use: No  . Sexually Active: Not on file   Other Topics Concern  . Not on file   Social History Narrative  . No narrative on file    Family History  Problem Relation Age of Onset  . Hypertension Mother   . Diabetes Father   . Heart attack Neg Hx   . Sudden death Neg Hx   . Hyperlipidemia Other     BP 113/63  Pulse 85  Ht 5\' 6"  (1.676 m)  Wt 263 lb (119.296 kg)  BMI 42.47 kg/m2  Review  of Systems See HPI above.    Objective:   Physical Exam Gen: NAD  L knee: No gross deformity, ecchymoses, effusion. TTP medial joint line.  No post patellar facet, lateral joint line TTP. FROM. Negative ant/post drawers. Negative valgus/varus testing. Negative lachmanns. Pain medially with mcmurrays, apleys.  Negative patellar apprehension, clarkes. NV intact distally.    Assessment & Plan:  1. Left knee pain - normal x-rays a year ago.  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  Trial cortisone shot.  Shown home exercise program.  Icing, tylenol, nsaids as needed.  Consider MRI if not improving.  We will increase neurontin to 600mg  three times a day from 300mg  three times a day.  After informed written consent, patient was seated on exam table. Left knee was prepped with alcohol swab and utilizing anterolateral approach, patient's left knee was injected intraarticularly with 3:1 marcaine: depomedrol. Patient tolerated the procedure well without immediate complications.

## 2012-08-02 NOTE — Assessment & Plan Note (Signed)
normal x-rays a year ago.  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  Trial cortisone shot.  Shown home exercise program.  Icing, tylenol, nsaids as needed.  Consider MRI if not improving.  We will increase neurontin to 600mg  three times a day from 300mg  three times a day.  After informed written consent, patient was seated on exam table. Left knee was prepped with alcohol swab and utilizing anterolateral approach, patient's left knee was injected intraarticularly with 3:1 marcaine: depomedrol. Patient tolerated the procedure well without immediate complications.

## 2012-08-26 ENCOUNTER — Ambulatory Visit (INDEPENDENT_AMBULATORY_CARE_PROVIDER_SITE_OTHER): Payer: Worker's Compensation | Admitting: Family Medicine

## 2012-08-26 ENCOUNTER — Encounter: Payer: Self-pay | Admitting: Family Medicine

## 2012-08-26 VITALS — BP 131/90 | HR 76 | Ht 66.0 in | Wt 284.2 lb

## 2012-08-26 DIAGNOSIS — M25562 Pain in left knee: Secondary | ICD-10-CM

## 2012-08-26 DIAGNOSIS — M25569 Pain in unspecified knee: Secondary | ICD-10-CM

## 2012-08-27 ENCOUNTER — Encounter: Payer: Self-pay | Admitting: Family Medicine

## 2012-08-27 NOTE — Assessment & Plan Note (Signed)
normal x-rays a year ago.  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  Injection did not help give lasting relief though some improvement over a few days confirming intraarticular pathology.  Concerned she may need arthroscopy for a large meniscal tear.  Will move forward with MRI to further assess. Icing, tylenol, etodolac as needed.

## 2012-08-27 NOTE — Patient Instructions (Addendum)
We will move ahead with an mri and call you with results and how to proceed.

## 2012-08-27 NOTE — Progress Notes (Addendum)
Subjective:    Patient ID: Kristen Woods, female    DOB: Dec 25, 1965, 47 y.o.   MRN: [id number]  PCP: [facility]  Knee Pain    47 yo F here for f/u left knee pain.  4/10: Patient reports several months of pain on inside of left knee. Knee bothers her more when sitting for prolonged time. Feels like it locks when she goes to stand up. If walking a lot leg feels like it goes outward. Some swelling. Has been icing, taking advil or aleve. More bruising but not around knee joint. No new injuries. Would like to go up on her neurontin (see prior visits). Had x-rays in June 2013 that showed no evidence of arthritis.  5/8: Patient reports she only had a few days relief with intraarticular left knee injection. Almost gave out once. No catching or locking. Some swelling. Taking etodolac 500mg  daily. No issues with back now.  Past Medical History  Diagnosis Date  . Hypertension   . Leg pain   . Nerves     Current Outpatient Prescriptions on File Prior to Visit  Medication Sig Dispense Refill  . ascorbic acid (VITAMIN C) 500 MG tablet Take 500 mg by mouth daily.      . Biotin 10 MG CAPS Take 1 tablet by mouth daily.      Marland Kitchen gabapentin (NEURONTIN) 300 MG capsule Take 2 capsules (600 mg total) by mouth 3 (three) times daily.  180 capsule  3  . lisinopril (PRINIVIL,ZESTRIL) 10 MG tablet Take 10 mg by mouth daily.      Marland Kitchen lisinopril-hydrochlorothiazide (PRINZIDE,ZESTORETIC) 20-25 MG per tablet       . valACYclovir (VALTREX) 500 MG tablet        No current facility-administered medications on file prior to visit.    Past Surgical History  Procedure Laterality Date  . Breast surgery    . Knee surgery    . Incontinence surgery      No Known Allergies  History   Social History  . Marital Status: Single    Spouse Name: N/A    Number of Children: N/A  . Years of Education: N/A   Occupational History  . Not on file.   Social History Main Topics  . Smoking status:  Never Smoker   . Smokeless tobacco: Not on file  . Alcohol Use: No  . Drug Use: No  . Sexually Active: Not on file   Other Topics Concern  . Not on file   Social History Narrative  . No narrative on file    Family History  Problem Relation Age of Onset  . Hypertension Mother   . Diabetes Father   . Heart attack Neg Hx   . Sudden death Neg Hx   . Hyperlipidemia Other     BP 131/90  Pulse 76  Ht 5\' 6"  (1.676 m)  Wt 284 lb 3.2 oz (128.912 kg)  BMI 45.89 kg/m2  Review of Systems  See HPI above.    Objective:   Physical Exam  Gen: NAD  L knee: No gross deformity, ecchymoses, effusion. TTP medial joint line, less lateral joint line.  No post patellar facet TTP. FROM. Negative ant/post drawers. Negative valgus/varus testing. Negative lachmanns. Pain medially with mcmurrays, apleys.  Negative patellar apprehension, clarkes. NV intact distally.    Assessment & Plan:  1. Left knee pain - normal x-rays a year ago.  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  Injection did not help give lasting  relief though some improvement over a few days confirming intraarticular pathology.  Concerned she may need arthroscopy for a large meniscal tear.  Will move forward with MRI to further assess. Icing, tylenol, etodolac as needed.  Addendum 7/16:  Patient's MRI reviewed and discussed with patient.  As expected she has a large complex medial meniscal tear within left knee.  Also has a small tear lateral meniscus.  She has failed conservative management and at this point will need to move forward with probable arthroscopy and meniscal debridement.  As noted previously I do believe this is related to the fall she had at work in June 2013.

## 2012-10-20 ENCOUNTER — Ambulatory Visit: Payer: Worker's Compensation | Admitting: Family Medicine

## 2012-10-26 ENCOUNTER — Ambulatory Visit: Payer: Self-pay | Admitting: Family Medicine

## 2012-11-03 NOTE — Addendum Note (Signed)
Addended by: Kathi Simpers F on: 11/03/2012 04:32 PM   Modules accepted: Orders

## 2013-10-31 ENCOUNTER — Emergency Department (HOSPITAL_BASED_OUTPATIENT_CLINIC_OR_DEPARTMENT_OTHER)
Admission: EM | Admit: 2013-10-31 | Discharge: 2013-10-31 | Disposition: A | Discharge status: Home / Self Care | Payer: 59 | Attending: Emergency Medicine | Admitting: Emergency Medicine

## 2013-10-31 ENCOUNTER — Emergency Department (HOSPITAL_BASED_OUTPATIENT_CLINIC_OR_DEPARTMENT_OTHER): Payer: 59

## 2013-10-31 ENCOUNTER — Encounter (HOSPITAL_BASED_OUTPATIENT_CLINIC_OR_DEPARTMENT_OTHER): Payer: Self-pay | Admitting: Emergency Medicine

## 2013-10-31 DIAGNOSIS — Z79899 Other long term (current) drug therapy: Secondary | ICD-10-CM | POA: Diagnosis not present

## 2013-10-31 DIAGNOSIS — A599 Trichomoniasis, unspecified: Secondary | ICD-10-CM

## 2013-10-31 DIAGNOSIS — R1032 Left lower quadrant pain: Secondary | ICD-10-CM | POA: Diagnosis present

## 2013-10-31 DIAGNOSIS — A5901 Trichomonal vulvovaginitis: Secondary | ICD-10-CM | POA: Insufficient documentation

## 2013-10-31 DIAGNOSIS — Z8669 Personal history of other diseases of the nervous system and sense organs: Secondary | ICD-10-CM | POA: Insufficient documentation

## 2013-10-31 DIAGNOSIS — I10 Essential (primary) hypertension: Secondary | ICD-10-CM | POA: Insufficient documentation

## 2013-10-31 DIAGNOSIS — Z3202 Encounter for pregnancy test, result negative: Secondary | ICD-10-CM | POA: Insufficient documentation

## 2013-10-31 DIAGNOSIS — D259 Leiomyoma of uterus, unspecified: Secondary | ICD-10-CM | POA: Diagnosis not present

## 2013-10-31 DIAGNOSIS — N390 Urinary tract infection, site not specified: Secondary | ICD-10-CM

## 2013-10-31 DIAGNOSIS — D250 Submucous leiomyoma of uterus: Secondary | ICD-10-CM

## 2013-10-31 LAB — COMPREHENSIVE METABOLIC PANEL
ALBUMIN: 3.5 g/dL (ref 3.5–5.2)
ALT: 8 U/L (ref 0–35)
ANION GAP: 13 (ref 5–15)
AST: 11 U/L (ref 0–37)
Alkaline Phosphatase: 84 U/L (ref 39–117)
BILIRUBIN TOTAL: 0.4 mg/dL (ref 0.3–1.2)
BUN: 18 mg/dL (ref 6–23)
CALCIUM: 9.7 mg/dL (ref 8.4–10.5)
CHLORIDE: 100 meq/L (ref 96–112)
CO2: 25 mEq/L (ref 19–32)
CREATININE: 1.2 mg/dL — AB (ref 0.50–1.10)
GFR calc Af Amer: 61 mL/min — ABNORMAL LOW (ref >90)
GFR calc non Af Amer: 53 mL/min — ABNORMAL LOW (ref >90)
Glucose, Bld: 100 mg/dL — ABNORMAL HIGH (ref 70–99)
Potassium: 3.8 mEq/L (ref 3.7–5.3)
Sodium: 138 mEq/L (ref 137–147)
TOTAL PROTEIN: 7.2 g/dL (ref 6.0–8.3)

## 2013-10-31 LAB — URINALYSIS, ROUTINE W REFLEX MICROSCOPIC
Bilirubin Urine: NEGATIVE
GLUCOSE, UA: NEGATIVE mg/dL
HGB URINE DIPSTICK: NEGATIVE
KETONES UR: NEGATIVE mg/dL
Nitrite: NEGATIVE
PROTEIN: NEGATIVE mg/dL
Specific Gravity, Urine: 1.012 (ref 1.005–1.030)
UROBILINOGEN UA: 0.2 mg/dL (ref 0.0–1.0)
pH: 5.5 (ref 5.0–8.0)

## 2013-10-31 LAB — CBC WITH DIFFERENTIAL/PLATELET
BASOS ABS: 0 10*3/uL (ref 0.0–0.1)
BASOS PCT: 0 % (ref 0–1)
EOS ABS: 0.4 10*3/uL (ref 0.0–0.7)
EOS PCT: 4 % (ref 0–5)
HEMATOCRIT: 32.7 % — AB (ref 36.0–46.0)
HEMOGLOBIN: 10.7 g/dL — AB (ref 12.0–15.0)
Lymphocytes Relative: 19 % (ref 12–46)
Lymphs Abs: 2 10*3/uL (ref 0.7–4.0)
MCH: 28 pg (ref 26.0–34.0)
MCHC: 32.7 g/dL (ref 30.0–36.0)
MCV: 85.6 fL (ref 78.0–100.0)
MONO ABS: 1.3 10*3/uL — AB (ref 0.1–1.0)
MONOS PCT: 13 % — AB (ref 3–12)
Neutro Abs: 6.8 10*3/uL (ref 1.7–7.7)
Neutrophils Relative %: 64 % (ref 43–77)
Platelets: 418 10*3/uL — ABNORMAL HIGH (ref 150–400)
RBC: 3.82 MIL/uL — ABNORMAL LOW (ref 3.87–5.11)
RDW: 14 % (ref 11.5–15.5)
WBC: 10.6 10*3/uL — ABNORMAL HIGH (ref 4.0–10.5)

## 2013-10-31 LAB — URINE MICROSCOPIC-ADD ON

## 2013-10-31 LAB — WET PREP, GENITAL: YEAST WET PREP: NONE SEEN

## 2013-10-31 LAB — PREGNANCY, URINE: Preg Test, Ur: NEGATIVE

## 2013-10-31 LAB — LIPASE, BLOOD: LIPASE: 34 U/L (ref 11–59)

## 2013-10-31 MED ORDER — HYDROCODONE-ACETAMINOPHEN 5-325 MG PO TABS: 1.0000 | ORAL_TABLET | Freq: Four times a day (QID) | ORAL | Status: DC | PRN

## 2013-10-31 MED ORDER — MORPHINE SULFATE 4 MG/ML IJ SOLN
4.0000 mg | Freq: Once | INTRAMUSCULAR | Status: AC
  Administered 2013-10-31: 4 mg via INTRAVENOUS
  Filled 2013-10-31: qty 1

## 2013-10-31 MED ORDER — METRONIDAZOLE 500 MG PO TABS: 500.0000 mg | ORAL_TABLET | Freq: Two times a day (BID) | ORAL | Status: DC

## 2013-10-31 MED ORDER — CEPHALEXIN 500 MG PO CAPS: 500.0000 mg | ORAL_CAPSULE | Freq: Two times a day (BID) | ORAL | Status: DC

## 2013-10-31 MED ORDER — ONDANSETRON HCL 4 MG/2ML IJ SOLN
4.0000 mg | Freq: Once | INTRAMUSCULAR | Status: AC
  Administered 2013-10-31: 4 mg via INTRAVENOUS
  Filled 2013-10-31: qty 2

## 2013-10-31 NOTE — ED Notes (Signed)
Pt reports severe pain to left flank and lower abd, denies urinary symptoms, bm wnl, denies n/v or diarrhea

## 2013-10-31 NOTE — ED Provider Notes (Signed)
CSN: [id number]     Arrival date & time 10/31/13  1912 History   First MD Initiated Contact with Patient 10/31/13 2029     Chief Complaint  Patient presents with  . Abdominal Pain     (Consider location/radiation/quality/duration/timing/severity/associated sxs/prior Treatment) Patient is a 48 y.o. female presenting with abdominal pain. The history is provided by the patient. No language interpreter was used.  Abdominal Pain Pain location:  LLQ Pain quality: aching   Pain radiates to:  L flank Pain severity:  Moderate Onset quality:  Gradual Timing:  Constant Progression:  Unchanged Chronicity:  New Context: not alcohol use and not retching   Relieved by:  Nothing Worsened by:  Nothing tried Ineffective treatments:  None tried Associated symptoms: vaginal discharge   Associated symptoms: no fever and no nausea     Past Medical History  Diagnosis Date  . Hypertension   . Leg pain   . Nerves    Past Surgical History  Procedure Laterality Date  . Breast surgery    . Knee surgery    . Incontinence surgery     Family History  Problem Relation Age of Onset  . Hypertension Mother   . Diabetes Father   . Heart attack Neg Hx   . Sudden death Neg Hx   . Hyperlipidemia Other    History  Substance Use Topics  . Smoking status: Never Smoker   . Smokeless tobacco: Not on file  . Alcohol Use: No   OB History   Grav Para Term Preterm Abortions TAB SAB Ect Mult Living                 Review of Systems  Constitutional: Negative for fever.  Respiratory: Negative.   Cardiovascular: Negative.   Gastrointestinal: Positive for abdominal pain. Negative for nausea.  Genitourinary: Positive for vaginal discharge.      Allergies  Review of patient's allergies indicates no known allergies.  Home Medications   Prior to Admission medications   Medication Sig Start Date End Date Taking? Authorizing Provider  ascorbic acid (VITAMIN C) 500 MG tablet Take 500 mg by mouth  daily.    Historical Provider, MD  Biotin 10 MG CAPS Take 1 tablet by mouth daily.    Historical Provider, MD  gabapentin (NEURONTIN) 300 MG capsule Take 2 capsules (600 mg total) by mouth 3 (three) times daily. 07/29/12   Dene Gentry, MD  lisinopril (PRINIVIL,ZESTRIL) 10 MG tablet Take 10 mg by mouth daily.    Historical Provider, MD  lisinopril-hydrochlorothiazide (PRINZIDE,ZESTORETIC) 20-25 MG per tablet  07/09/12   Historical Provider, MD  valACYclovir (VALTREX) 500 MG tablet  07/19/12   Historical Provider, MD   BP 123/80  Pulse 116  Temp(Src) 99.5 F (37.5 C) (Oral)  Resp 20  Ht 5\' 6"  (1.676 m)  Wt 267 lb (121.11 kg)  BMI 43.12 kg/m2  SpO2 100%  LMP 10/16/2013 Physical Exam  Nursing note and vitals reviewed. Constitutional: She is oriented to person, place, and time. She appears well-developed and well-nourished.  Cardiovascular: Normal rate and regular rhythm.   Pulmonary/Chest: Effort normal and breath sounds normal.  Abdominal: Soft. Bowel sounds are normal. There is no tenderness.  Genitourinary: Vaginal discharge found.  Yellow discharge.-cmt. Left adnexal tenderness  Musculoskeletal: Normal range of motion.  Neurological: She is alert and oriented to person, place, and time.  Skin: Skin is warm and dry.  Psychiatric: She has a normal mood and affect.    ED Course  Procedures (  including critical care time) Labs Review Labs Reviewed  WET PREP, GENITAL - Abnormal; Notable for the following:    Trich, Wet Prep MANY (*)    Clue Cells Wet Prep HPF POC FEW (*)    WBC, Wet Prep HPF POC MODERATE (*)    All other components within normal limits  URINALYSIS, ROUTINE W REFLEX MICROSCOPIC - Abnormal; Notable for the following:    APPearance CLOUDY (*)    Leukocytes, UA MODERATE (*)    All other components within normal limits  CBC WITH DIFFERENTIAL - Abnormal; Notable for the following:    WBC 10.6 (*)    RBC 3.82 (*)    Hemoglobin 10.7 (*)    HCT 32.7 (*)     Platelets 418 (*)    Monocytes Relative 13 (*)    Monocytes Absolute 1.3 (*)    All other components within normal limits  COMPREHENSIVE METABOLIC PANEL - Abnormal; Notable for the following:    Glucose, Bld 100 (*)    Creatinine, Ser 1.20 (*)    GFR calc non Af Amer 53 (*)    GFR calc Af Amer 61 (*)    All other components within normal limits  URINE MICROSCOPIC-ADD ON - Abnormal; Notable for the following:    Squamous Epithelial / LPF FEW (*)    Bacteria, UA FEW (*)    All other components within normal limits  GC/CHLAMYDIA PROBE AMP  PREGNANCY, URINE  LIPASE, BLOOD    Imaging Review US Transvaginal Non-ob  10/31/2013   CLINICAL DATA:  Left lower quadrant pain for 3 days  EXAM: TRANSABDOMINAL AND TRANSVAGINAL ULTRASOUND OF PELVIS  TECHNIQUE: Both transabdominal and transvaginal ultrasound examinations of the pelvis were performed. Transabdominal technique was performed for global imaging of the pelvis including uterus, ovaries, adnexal regions, and pelvic cul-de-sac. It was necessary to proceed with endovaginal exam following the transabdominal exam to visualize the adnexa and endometrium the.  COMPARISON:  None  FINDINGS: Uterus  Measurements: 10 x 5 x 6 cm. There is a indistinct masslike thickening in the anterior intramural uterine body measuring 3 cm in diameter. There is deformation at the anterior endometrium by a 1 cm hypoechoic lesion, suggesting submucosal fibroid.  Endometrium  Thickness: 8 mm.  No focal abnormality visualized.  Right ovary  Not confidently identified.  No adnexal mass.  Left ovary  Not confidently identified.  No adnexal mass.  Other findings  Small volume, simple appearing free pelvic fluid.  IMPRESSION: 1. The ovaries were not confidently identified.  No adnexal mass. 2. Two uterine fibroids, measuring up to 3 cm.   Electronically Signed   By: Jorje Guild M.D.   On: 10/31/2013 22:25   US Pelvis Complete  10/31/2013   CLINICAL DATA:  Left lower quadrant pain  for 3 days  EXAM: TRANSABDOMINAL AND TRANSVAGINAL ULTRASOUND OF PELVIS  TECHNIQUE: Both transabdominal and transvaginal ultrasound examinations of the pelvis were performed. Transabdominal technique was performed for global imaging of the pelvis including uterus, ovaries, adnexal regions, and pelvic cul-de-sac. It was necessary to proceed with endovaginal exam following the transabdominal exam to visualize the adnexa and endometrium the.  COMPARISON:  None  FINDINGS: Uterus  Measurements: 10 x 5 x 6 cm. There is a indistinct masslike thickening in the anterior intramural uterine body measuring 3 cm in diameter. There is deformation at the anterior endometrium by a 1 cm hypoechoic lesion, suggesting submucosal fibroid.  Endometrium  Thickness: 8 mm.  No focal abnormality visualized.  Right  ovary  Not confidently identified.  No adnexal mass.  Left ovary  Not confidently identified.  No adnexal mass.  Other findings  Small volume, simple appearing free pelvic fluid.  IMPRESSION: 1. The ovaries were not confidently identified.  No adnexal mass. 2. Two uterine fibroids, measuring up to 3 cm.   Electronically Signed   By: Jorje Guild M.D.   On: 10/31/2013 22:25     EKG Interpretation None      MDM   Final diagnoses:  UTI (lower urinary tract infection)  Trichimoniasis  Submucous uterine fibroid    Pt is treated for trich with flagyl. Pain is likely related to fibroids and std. Will treat for uti as swell. Pt educated on std prevention    Glendell Docker, NP 10/31/13 2236

## 2013-10-31 NOTE — ED Provider Notes (Signed)
Medical screening examination/treatment/procedure(s) were performed by non-physician practitioner and as supervising physician I was immediately available for consultation/collaboration.  Neta Ehlers, MD 10/31/13 [phone]

## 2013-10-31 NOTE — Discharge Instructions (Signed)
Fibroids Fibroids are lumps (tumors) that can occur any place in a woman's body. These lumps are not cancerous. Fibroids vary in size, weight, and where they grow. HOME CARE  Do not take aspirin.  Write down the number of pads or tampons you use during your period. Tell your doctor. This can help determine the best treatment for you. GET HELP RIGHT AWAY IF:  You have pain in your lower belly (abdomen) that is not helped with medicine.  You have cramps that are not helped with medicine.  You have more bleeding between or during your period.  You feel lightheaded or pass out (faint).  Your lower belly pain gets worse. MAKE SURE YOU:  Understand these instructions.  Will watch your condition.  Will get help right away if you are not doing well or get worse. Document Released: 05/10/2010 Document Revised: 06/30/2011 Document Reviewed: 05/10/2010 [facility] Patient Information 2015 [place], [place]. This information is not intended to replace advice given to you by your health care provider. Make sure you discuss any questions you have with your health care provider.  Urinary Tract Infection Urinary tract infections (UTIs) can develop anywhere along your urinary tract. Your urinary tract is your body's drainage system for removing wastes and extra water. Your urinary tract includes two kidneys, two ureters, a bladder, and a urethra. Your kidneys are a pair of bean-shaped organs. Each kidney is about the size of your fist. They are located below your ribs, one on each side of your spine. CAUSES Infections are caused by microbes, which are microscopic organisms, including fungi, viruses, and bacteria. These organisms are so small that they can only be seen through a microscope. Bacteria are the microbes that most commonly cause UTIs. SYMPTOMS  Symptoms of UTIs may vary by age and gender of the patient and by the location of the infection. Symptoms in young women typically include a frequent  and intense urge to urinate and a painful, burning feeling in the bladder or urethra during urination. Older women and men are more likely to be tired, shaky, and weak and have muscle aches and abdominal pain. A fever may mean the infection is in your kidneys. Other symptoms of a kidney infection include pain in your back or sides below the ribs, nausea, and vomiting. DIAGNOSIS To diagnose a UTI, your caregiver will ask you about your symptoms. Your caregiver also will ask to provide a urine sample. The urine sample will be tested for bacteria and white blood cells. White blood cells are made by your body to help fight infection. TREATMENT  Typically, UTIs can be treated with medication. Because most UTIs are caused by a bacterial infection, they usually can be treated with the use of antibiotics. The choice of antibiotic and length of treatment depend on your symptoms and the type of bacteria causing your infection. HOME CARE INSTRUCTIONS  If you were prescribed antibiotics, take them exactly as your caregiver instructs you. Finish the medication even if you feel better after you have only taken some of the medication.  Drink enough water and fluids to keep your urine clear or pale yellow.  Avoid caffeine, tea, and carbonated beverages. They tend to irritate your bladder.  Empty your bladder often. Avoid holding urine for long periods of time.  Empty your bladder before and after sexual intercourse.  After a bowel movement, women should cleanse from front to back. Use each tissue only once. SEEK MEDICAL CARE IF:   You have back pain.  You  develop a fever.  Your symptoms do not begin to resolve within 3 days. SEEK IMMEDIATE MEDICAL CARE IF:   You have severe back pain or lower abdominal pain.  You develop chills.  You have nausea or vomiting.  You have continued burning or discomfort with urination. MAKE SURE YOU:   Understand these instructions.  Will watch your  condition.  Will get help right away if you are not doing well or get worse. Document Released: 01/15/2005 Document Revised: 10/07/2011 Document Reviewed: 05/16/2011 [facility] Patient Information 2015 [place], [place]. This information is not intended to replace advice given to you by your health care provider. Make sure you discuss any questions you have with your health care provider.

## 2013-11-01 LAB — GC/CHLAMYDIA PROBE AMP
CT PROBE, AMP APTIMA: NEGATIVE
GC Probe RNA: NEGATIVE

## 2014-04-16 ENCOUNTER — Encounter (HOSPITAL_BASED_OUTPATIENT_CLINIC_OR_DEPARTMENT_OTHER): Payer: Self-pay

## 2014-04-16 ENCOUNTER — Emergency Department (HOSPITAL_BASED_OUTPATIENT_CLINIC_OR_DEPARTMENT_OTHER)
Admission: EM | Admit: 2014-04-16 | Discharge: 2014-04-16 | Disposition: A | Discharge status: Home / Self Care | Payer: 59 | Attending: Emergency Medicine | Admitting: Emergency Medicine

## 2014-04-16 DIAGNOSIS — Z79899 Other long term (current) drug therapy: Secondary | ICD-10-CM | POA: Insufficient documentation

## 2014-04-16 DIAGNOSIS — Z791 Long term (current) use of non-steroidal anti-inflammatories (NSAID): Secondary | ICD-10-CM | POA: Diagnosis not present

## 2014-04-16 DIAGNOSIS — M25512 Pain in left shoulder: Secondary | ICD-10-CM | POA: Insufficient documentation

## 2014-04-16 DIAGNOSIS — Z87828 Personal history of other (healed) physical injury and trauma: Secondary | ICD-10-CM | POA: Insufficient documentation

## 2014-04-16 DIAGNOSIS — I10 Essential (primary) hypertension: Secondary | ICD-10-CM | POA: Diagnosis not present

## 2014-04-16 MED ORDER — DEXAMETHASONE SODIUM PHOSPHATE 10 MG/ML IJ SOLN
10.0000 mg | Freq: Once | INTRAMUSCULAR | Status: AC
  Administered 2014-04-16: 10 mg via INTRAMUSCULAR
  Filled 2014-04-16: qty 1

## 2014-04-16 MED ORDER — KETOROLAC TROMETHAMINE 60 MG/2ML IM SOLN
60.0000 mg | Freq: Once | INTRAMUSCULAR | Status: AC
  Administered 2014-04-16: 60 mg via INTRAMUSCULAR
  Filled 2014-04-16: qty 2

## 2014-04-16 NOTE — Discharge Instructions (Signed)
Rotator Cuff Injury °Rotator cuff injury is any type of injury to the set of muscles and tendons that make up the stabilizing unit of your shoulder. This unit holds the ball of your upper arm bone (humerus) in the socket of your shoulder blade (scapula).  °CAUSES °Injuries to your rotator cuff most commonly come from sports or activities that cause your arm to be moved repeatedly over your head. Examples of this include throwing, weight lifting, swimming, or racquet sports. Long lasting (chronic) irritation of your rotator cuff can cause soreness and swelling (inflammation), bursitis, and eventual damage to your tendons, such as a tear (rupture). °SIGNS AND SYMPTOMS °Acute rotator cuff tear: °· Sudden tearing sensation followed by severe pain shooting from your upper shoulder down your arm toward your elbow. °· Decreased range of motion of your shoulder because of pain and muscle spasm. °· Severe pain. °· Inability to raise your arm out to the side because of pain and loss of muscle power (large tears). °Chronic rotator cuff tear: °· Pain that usually is worse at night and may interfere with sleep. °· Gradual weakness and decreased shoulder motion as the pain worsens. °· Decreased range of motion. °Rotator cuff tendinitis:  °· Deep ache in your shoulder and the outside upper arm over your shoulder. °· Pain that comes on gradually and becomes worse when lifting your arm to the side or turning it inward. °DIAGNOSIS °Rotator cuff injury is diagnosed through a medical history, physical exam, and imaging exam. The medical history helps determine the type of rotator cuff injury. Your health care provider will look at your injured shoulder, feel the injured area, and ask you to move your shoulder in different positions. X-ray exams typically are done to rule out other causes of shoulder pain, such as fractures. MRI is the exam of choice for the most severe shoulder injuries because the images show muscles and tendons.    °TREATMENT  °Chronic tear: °· Medicine for pain, such as acetaminophen or ibuprofen. °· Physical therapy and range-of-motion exercises may be helpful in maintaining shoulder function and strength. °· Steroid injections into your shoulder joint. °· Surgical repair of the rotator cuff if the injury does not heal with noninvasive treatment. °Acute tear: °· Anti-inflammatory medicines such as ibuprofen and naproxen to help reduce pain and swelling. °· A sling to help support your arm and rest your rotator cuff muscles. Long-term use of a sling is not advised. It may cause significant stiffening of the shoulder joint. °· Surgery may be considered within a few weeks, especially in younger, active people, to return the shoulder to full function. °· Indications for surgical treatment include the following: °¨ Age younger than 60 years. °¨ Rotator cuff tears that are complete. °¨ Physical therapy, rest, and anti-inflammatory medicines have been used for 6-8 weeks, with no improvement. °¨ Employment or sporting activity that requires constant shoulder use. °Tendinitis: °· Anti-inflammatory medicines such as ibuprofen and naproxen to help reduce pain and swelling. °· A sling to help support your arm and rest your rotator cuff muscles. Long-term use of a sling is not advised. It may cause significant stiffening of the shoulder joint. °· Severe tendinitis may require: °¨ Steroid injections into your shoulder joint. °¨ Physical therapy. °¨ Surgery. °HOME CARE INSTRUCTIONS  °· Apply ice to your injury: °¨ Put ice in a plastic bag. °¨ Place a towel between your skin and the bag. °¨ Leave the ice on for 20 minutes, 2-3 times a day. °· If you   have a shoulder immobilizer (sling and straps), wear it until told otherwise by your health care provider.  You may want to sleep on several pillows or in a recliner at night to lessen swelling and pain.  Only take over-the-counter or prescription medicines for pain, discomfort, or fever as  directed by your health care provider.  Do simple hand squeezing exercises with a soft rubber ball to decrease hand swelling. SEEK MEDICAL CARE IF:   Your shoulder pain increases, or new pain or numbness develops in your arm, hand, or fingers.  Your hand or fingers are colder than your other hand. SEEK IMMEDIATE MEDICAL CARE IF:   Your arm, hand, or fingers are numb or tingling.  Your arm, hand, or fingers are increasingly swollen and painful, or they turn white or blue. MAKE SURE YOU:  Understand these instructions.  Will watch your condition.  Will get help right away if you are not doing well or get worse. Document Released: 04/04/2000 Document Revised: 04/12/2013 Document Reviewed: 11/17/2012 [facility] Patient Information 2015 [place], [place]. This information is not intended to replace advice given to you by your health care provider. Make sure you discuss any questions you have with your health care provider. Sling Use After Injury or Surgery You have been put in a sling today because of an injury or following surgery. If you have a tendon or bone injury it may take up to 6 weeks to heal. Use the sling as directed until your caregiver says it is no longer needed. The sling protects and keeps you from using the injured part. Hanging your arm in a sling will give rest and support to the injured part. This also helps with comfort and healing. Slings are used for injuries made worse or more painful by movement. Examples include:  Broken arms.  Broken collarbones.  Shoulder injuries.  Following surgery. The sling should fit comfortably, with your elbow at one end of the sling and your hand at the other end. Your elbow is bent 90 degrees lying across your waist and rests in the sling with your thumb pointing up. Make sure that the hand of the injured arm does not droop down. That could stretch some nerves in the wrist. Your hand should be slightly higher than your elbow. You may also  pad the sling behind your neck with some cloth or foam rubber.  A swathe may also be used if it is necessary to keep you from lifting your injured arm. A swathe is a wrap or ace bandage that goes around your chest over your injured arm.  To take the weight off your neck, some slings have a strap that goes around your neck and down your back. One strap is connected to the closed elbow side of the sling with the other end of the strap attached to the wrist side. With a sling like this, your injured shoulder, arm, wrist, or hand is in the sling, the weight is more on your shoulder and back. This is different from the illustration where the sling is supported only by the neck.  In an emergency, a sling can be as simple as a belt or towel tied around your neck to hold your forearm.  HOME CARE INSTRUCTIONS   Do not use your shoulder until instructed to by your caregiver.  If you have been prescribed physical therapy, keep appointments as directed.  For the first couple days following your injury and during times when you are sore, you may use  ice on the injured area for 15-20 minutes 03-04 times per day while awake. Put the ice in a plastic bag and place a towel between the bag of ice and your skin. This will help keep the swelling down.  If there is numbness in the fifth finger and ring fingers you may need to pad the elbow to relieve pressure on the ulnar nerve (the crazy bone).  Keep your arm on your chest when lying down.  If a plaster splint was applied, wear the splint until you are seen for a follow-up examination. Rest it on nothing harder than a pillow the first 24 hours. Do not get it wet. You may take it off to take a shower or bath unless instructed otherwise by your caregiver.  You may have been given an elastic bandage to use with the plaster splint or alone. The splint is too tight if you have numbness, tingling, or if your hand becomes cold and blue. Adjust or reapply the bandage to make  it comfortable.  Only take over-the-counter or prescription medicines for pain, discomfort, or fever as directed by your caregiver.  If range of motion exercises are permitted by your caregiver, do not go over the limits suggested. If you have increased pain from doing gentle exercises, stop the exercises until you see your caregiver again.  The length of time needed for healing depends on what your injury or surgery was. SEEK IMMEDIATE MEDICAL CARE IF:   You have an increase in bruising, swelling or pain in the area of your injury or surgery.  You notice a blue color of or coldness in your fingers.  Pain relief is not obtained with medications or any of your problems are getting worse. Document Released: 11/20/2003 Document Revised: 03/24/2012 Document Reviewed: 02/20/2007 [facility] Patient Information 2015 [place], [place]. This information is not intended to replace advice given to you by your health care provider. Make sure you discuss any questions you have with your health care provider.

## 2014-04-16 NOTE — ED Provider Notes (Signed)
CSN: [id number]     Arrival date & time 04/16/14  7253 History   First MD Initiated Contact with Patient 04/16/14 [phone]     Chief Complaint  Patient presents with  . Shoulder Injury     (Consider location/radiation/quality/duration/timing/severity/associated sxs/prior Treatment) HPI Comments: Patient presents to the ER for evaluation of left shoulder pain. Patient reports that she has been having problems with her shoulders and she fell in 2013. Patient reports at that time she had a significant knee injury required surgeries. She was focusing more on the knee, and has not had the shoulder addressed. The last 3 days she has had increased pain in the left shoulder that has been constant and worsens with movement. No new injury.  Patient is a 48 y.o. female presenting with shoulder injury.  Shoulder Injury    Past Medical History  Diagnosis Date  . Hypertension   . Leg pain   . Nerves    Past Surgical History  Procedure Laterality Date  . Breast surgery    . Knee surgery    . Incontinence surgery     Family History  Problem Relation Age of Onset  . Hypertension Mother   . Diabetes Father   . Heart attack Neg Hx   . Sudden death Neg Hx   . Hyperlipidemia Other    History  Substance Use Topics  . Smoking status: Never Smoker   . Smokeless tobacco: Not on file  . Alcohol Use: No   OB History    No data available     Review of Systems  Musculoskeletal: Positive for arthralgias.  All other systems reviewed and are negative.     Allergies  Review of patient's allergies indicates no known allergies.  Home Medications   Prior to Admission medications   Medication Sig Start Date End Date Taking? Authorizing Provider  diclofenac (VOLTAREN) 75 MG EC tablet Take 75 mg by mouth 2 (two) times daily.   Yes Historical Provider, MD  nabumetone (RELAFEN) 750 MG tablet Take 750 mg by mouth 2 (two) times daily as needed.   Yes Historical Provider, MD  ascorbic acid (VITAMIN  C) 500 MG tablet Take 500 mg by mouth daily.    Historical Provider, MD  Biotin 10 MG CAPS Take 1 tablet by mouth daily.    Historical Provider, MD  lisinopril (PRINIVIL,ZESTRIL) 10 MG tablet Take 10 mg by mouth daily.    Historical Provider, MD  lisinopril-hydrochlorothiazide (PRINZIDE,ZESTORETIC) 20-25 MG per tablet  07/09/12   Historical Provider, MD  valACYclovir (VALTREX) 500 MG tablet  07/19/12   Historical Provider, MD   BP 120/93 mmHg  Pulse 78  Temp(Src) 98.5 F (36.9 C) (Oral)  Resp 18  Ht 5\' 6"  (1.676 m)  Wt 265 lb (120.203 kg)  BMI 42.79 kg/m2  SpO2 98% Physical Exam  Constitutional: She is oriented to person, place, and time. She appears well-developed and well-nourished. No distress.  HENT:  Head: Normocephalic and atraumatic.  Right Ear: Hearing normal.  Left Ear: Hearing normal.  Nose: Nose normal.  Mouth/Throat: Oropharynx is clear and moist and mucous membranes are normal.  Eyes: Conjunctivae and EOM are normal. Pupils are equal, round, and reactive to light.  Neck: Normal range of motion. Neck supple.  Cardiovascular: Regular rhythm, S1 normal and S2 normal.  Exam reveals no gallop and no friction rub.   No murmur heard. Pulmonary/Chest: Effort normal and breath sounds normal. No respiratory distress. She exhibits no tenderness.  Abdominal: Soft. Normal appearance  and bowel sounds are normal. There is no hepatosplenomegaly. There is no tenderness. There is no rebound, no guarding, no tenderness at McBurney's point and negative Murphy's sign. No hernia.  Musculoskeletal:       Left shoulder: She exhibits decreased range of motion (Painful inhibition) and tenderness (diffuse, but mainly posterior). She exhibits no deformity.  Neurological: She is alert and oriented to person, place, and time. She has normal strength. No cranial nerve deficit or sensory deficit. Coordination normal. GCS eye subscore is 4. GCS verbal subscore is 5. GCS motor subscore is 6.  Skin: Skin is  warm, dry and intact. No rash noted. No cyanosis.  Psychiatric: She has a normal mood and affect. Her speech is normal and behavior is normal. Thought content normal.  Nursing note and vitals reviewed.   ED Course  Procedures (including critical care time) Labs Review Labs Reviewed - No data to display  Imaging Review No results found.   EKG Interpretation None      MDM   Final diagnoses:  None   left shoulder pain, possible rotator cuff injury  Presents to the ER for evaluation of worsening left shoulder pain has been somewhat chronic in nature after a fall. I do suspect possible rotator cuff injury. She did not have significant tenderness in the subacromial or biceps tendon area. I cannot, however, rule out tendinitis and bursitis based on examination. Patient does not want any new pain medications. She was therefore admitted to Toradol and Decadron. She will follow-up with her orthopedic doctor or Dr. Barbaraann Barthel, who she has seen before as well. She was given a sling for comfort, instructed that she cannot wear it continuously.    Orpah Greek, MD 04/16/14 [phone]

## 2014-04-16 NOTE — ED Notes (Signed)
Patient reports intermittent left shoulder pain after fall in 2013, has never had xray on shoulder. Denies any new injury

## 2014-05-15 ENCOUNTER — Emergency Department (HOSPITAL_BASED_OUTPATIENT_CLINIC_OR_DEPARTMENT_OTHER): Payer: Worker's Compensation

## 2014-05-15 ENCOUNTER — Emergency Department (HOSPITAL_BASED_OUTPATIENT_CLINIC_OR_DEPARTMENT_OTHER)
Admission: EM | Admit: 2014-05-15 | Discharge: 2014-05-15 | Disposition: A | Discharge status: Home / Self Care | Payer: Worker's Compensation | Attending: Emergency Medicine | Admitting: Emergency Medicine

## 2014-05-15 ENCOUNTER — Encounter (HOSPITAL_BASED_OUTPATIENT_CLINIC_OR_DEPARTMENT_OTHER): Payer: Self-pay | Admitting: *Deleted

## 2014-05-15 DIAGNOSIS — Z23 Encounter for immunization: Secondary | ICD-10-CM | POA: Insufficient documentation

## 2014-05-15 DIAGNOSIS — L03116 Cellulitis of left lower limb: Secondary | ICD-10-CM | POA: Diagnosis not present

## 2014-05-15 DIAGNOSIS — W1839XA Other fall on same level, initial encounter: Secondary | ICD-10-CM | POA: Diagnosis not present

## 2014-05-15 DIAGNOSIS — Y9289 Other specified places as the place of occurrence of the external cause: Secondary | ICD-10-CM | POA: Diagnosis not present

## 2014-05-15 DIAGNOSIS — I10 Essential (primary) hypertension: Secondary | ICD-10-CM | POA: Insufficient documentation

## 2014-05-15 DIAGNOSIS — Y998 Other external cause status: Secondary | ICD-10-CM | POA: Diagnosis not present

## 2014-05-15 DIAGNOSIS — Z791 Long term (current) use of non-steroidal anti-inflammatories (NSAID): Secondary | ICD-10-CM | POA: Insufficient documentation

## 2014-05-15 DIAGNOSIS — S99922A Unspecified injury of left foot, initial encounter: Secondary | ICD-10-CM | POA: Insufficient documentation

## 2014-05-15 DIAGNOSIS — Y9389 Activity, other specified: Secondary | ICD-10-CM | POA: Insufficient documentation

## 2014-05-15 DIAGNOSIS — L03032 Cellulitis of left toe: Secondary | ICD-10-CM

## 2014-05-15 MED ORDER — AMOXICILLIN-POT CLAVULANATE 875-125 MG PO TABS: 1.0000 | ORAL_TABLET | Freq: Two times a day (BID) | ORAL | Status: DC

## 2014-05-15 MED ORDER — HYDROCODONE-ACETAMINOPHEN 5-325 MG PO TABS: 1.0000 | ORAL_TABLET | Freq: Four times a day (QID) | ORAL | Status: DC | PRN

## 2014-05-15 MED ORDER — TETANUS-DIPHTH-ACELL PERTUSSIS 5-2.5-18.5 LF-MCG/0.5 IM SUSP
0.5000 mL | Freq: Once | INTRAMUSCULAR | Status: AC
  Administered 2014-05-15: 0.5 mL via INTRAMUSCULAR
  Filled 2014-05-15: qty 0.5

## 2014-05-15 NOTE — ED Notes (Signed)
Rx x 1 given for norco-

## 2014-05-15 NOTE — ED Notes (Signed)
Pt c/o left 2nd toe injury x 3 days ago

## 2014-05-15 NOTE — ED Provider Notes (Addendum)
CSN: [id number]     Arrival date & time 05/15/14  1831 History  This chart was scribed for Wandra Arthurs, MD by Stephania Fragmin, ED Scribe. This patient was seen in room MH04/MH04 and the patient's care was started at 7:01 PM.    Chief Complaint  Patient presents with  . Toe Injury   The history is provided by the patient. A language interpreter was used.     HPI Comments: Kristen Woods is a 49 y.o. female who presents to the Emergency Department complaining of a left second toe injury that occurred 3 days ago, when she slipped in the bathtub and got her second toe caught in the drain. Patient denies a history of DM. She last had a physical one year ago.   Past Medical History  Diagnosis Date  . Hypertension   . Leg pain   . Nerves    Past Surgical History  Procedure Laterality Date  . Breast surgery    . Knee surgery    . Incontinence surgery     Family History  Problem Relation Age of Onset  . Hypertension Mother   . Diabetes Father   . Heart attack Neg Hx   . Sudden death Neg Hx   . Hyperlipidemia Other    History  Substance Use Topics  . Smoking status: Never Smoker   . Smokeless tobacco: Not on file  . Alcohol Use: No   OB History    No data available     Review of Systems  Skin: Positive for wound.  All other systems reviewed and are negative.  Allergies  Review of patient's allergies indicates no known allergies.  Home Medications   Prior to Admission medications   Medication Sig Start Date End Date Taking? Authorizing Provider  ascorbic acid (VITAMIN C) 500 MG tablet Take 500 mg by mouth daily.    Historical Provider, MD  Biotin 10 MG CAPS Take 1 tablet by mouth daily.    Historical Provider, MD  diclofenac (VOLTAREN) 75 MG EC tablet Take 75 mg by mouth 2 (two) times daily.    Historical Provider, MD  lisinopril (PRINIVIL,ZESTRIL) 10 MG tablet Take 10 mg by mouth daily.    Historical Provider, MD  lisinopril-hydrochlorothiazide (PRINZIDE,ZESTORETIC) 20-25  MG per tablet  07/09/12   Historical Provider, MD  nabumetone (RELAFEN) 750 MG tablet Take 750 mg by mouth 2 (two) times daily as needed.    Historical Provider, MD  valACYclovir (VALTREX) 500 MG tablet  07/19/12   Historical Provider, MD   BP 111/70 mmHg  Pulse 85  Temp(Src) 98.8 F (37.1 C)  Resp 16  Ht 5\' 6"  (1.676 m)  Wt 280 lb (127.007 kg)  BMI 45.21 kg/m2  SpO2 97%  LMP 04/10/2014 Physical Exam  Constitutional: She is oriented to person, place, and time. She appears well-developed and well-nourished. No distress.  HENT:  Head: Normocephalic and atraumatic.  Eyes: Conjunctivae and EOM are normal.  Neck: Neck supple. No tracheal deviation present.  Cardiovascular: Normal rate.   2+ pedal pulses.  Pulmonary/Chest: Effort normal. No respiratory distress.  Musculoskeletal: Normal range of motion.  Neurological: She is alert and oriented to person, place, and time.  Skin: Skin is warm and dry.  Left second toe with a 2 cm laceration with surrounding erythema, but no active bleeding or purulent discharge.  Psychiatric: She has a normal mood and affect. Her behavior is normal.  Nursing note and vitals reviewed.   ED Course  Procedures (including  critical care time)  DIAGNOSTIC STUDIES: Oxygen Saturation is 97% on room air, normal by my interpretation.    COORDINATION OF CARE: 7:00 PM - Discussed treatment plan with pt at bedside which includes left foot XR, and antibiotic and pain medication, and pt agreed to plan.  Imaging Review Dg Toe 2nd Left  05/15/2014   CLINICAL DATA:  LEFT second toe injury 3 days ago. Initial encounter.  EXAM: LEFT SECOND TOE  COMPARISON:  None.  FINDINGS: There is no evidence of fracture or dislocation. There is no evidence of arthropathy or other focal bone abnormality. Soft tissues are unremarkable.  IMPRESSION: Negative.   Electronically Signed   By: Dereck Ligas M.D.   On: 05/15/2014 19:38     MDM   Final diagnoses:  None   Kristen Woods is a 49 y.o. female here with R second toe infection after injury. Has laceration but its been 9 days old and no bleeding. I don't think she will benefit for delayed closure. There is evidence of cellulitis. Xray showed no osteo. She is not diabetic. Vitals stable, not septic appearing. Tdap updated in the ED. Will dc home with augmentin, prn vicodin.   I personally performed the services described in this documentation, which was scribed in my presence. The recorded information has been reviewed and is accurate.   Wandra Arthurs, MD 05/15/14 [place], MD 05/15/14 [phone]

## 2014-05-15 NOTE — ED Notes (Signed)
MD at bedside. 

## 2014-05-15 NOTE — Discharge Instructions (Signed)
Take motrin for pain.   Take vicodin for severe pain. Do NOT drive with it.   Take augmentin for a week.   Follow up with your doctor.   Return to ER if you have fever, worse redness, purulent drainage from the wound.

## 2014-05-31 ENCOUNTER — Emergency Department (HOSPITAL_BASED_OUTPATIENT_CLINIC_OR_DEPARTMENT_OTHER)
Admission: EM | Admit: 2014-05-31 | Discharge: 2014-06-01 | Disposition: A | Discharge status: Home / Self Care | Payer: 59 | Attending: Emergency Medicine | Admitting: Emergency Medicine

## 2014-05-31 ENCOUNTER — Emergency Department (HOSPITAL_BASED_OUTPATIENT_CLINIC_OR_DEPARTMENT_OTHER): Payer: 59

## 2014-05-31 ENCOUNTER — Encounter (HOSPITAL_BASED_OUTPATIENT_CLINIC_OR_DEPARTMENT_OTHER): Payer: Self-pay

## 2014-05-31 ENCOUNTER — Emergency Department (HOSPITAL_COMMUNITY): Payer: 59

## 2014-05-31 DIAGNOSIS — M25552 Pain in left hip: Secondary | ICD-10-CM | POA: Diagnosis present

## 2014-05-31 DIAGNOSIS — M461 Sacroiliitis, not elsewhere classified: Secondary | ICD-10-CM

## 2014-05-31 DIAGNOSIS — R29898 Other symptoms and signs involving the musculoskeletal system: Secondary | ICD-10-CM

## 2014-05-31 DIAGNOSIS — I10 Essential (primary) hypertension: Secondary | ICD-10-CM | POA: Insufficient documentation

## 2014-05-31 MED ORDER — SODIUM CHLORIDE 0.9 % IV BOLUS (SEPSIS)
1000.0000 mL | Freq: Once | INTRAVENOUS | Status: AC
  Administered 2014-05-31: 1000 mL via INTRAVENOUS

## 2014-05-31 MED ORDER — DICLOFENAC SODIUM 75 MG PO TBEC: 75.0000 mg | DELAYED_RELEASE_TABLET | Freq: Two times a day (BID) | ORAL | Status: DC | PRN

## 2014-05-31 MED ORDER — PREDNISONE 20 MG PO TABS: ORAL_TABLET | ORAL | Status: DC

## 2014-05-31 MED ORDER — MORPHINE SULFATE 4 MG/ML IJ SOLN
4.0000 mg | Freq: Once | INTRAMUSCULAR | Status: AC
  Administered 2014-05-31: 4 mg via INTRAVENOUS
  Filled 2014-05-31: qty 1

## 2014-05-31 MED ORDER — ONDANSETRON 8 MG PO TBDP
8.0000 mg | ORAL_TABLET | Freq: Once | ORAL | Status: AC
  Administered 2014-05-31: 8 mg via ORAL
  Filled 2014-05-31: qty 1

## 2014-05-31 MED ORDER — DIAZEPAM 5 MG/ML IJ SOLN
5.0000 mg | Freq: Once | INTRAMUSCULAR | Status: AC
  Administered 2014-05-31: 5 mg via INTRAMUSCULAR
  Filled 2014-05-31: qty 2

## 2014-05-31 MED ORDER — HYDROCODONE-ACETAMINOPHEN 5-325 MG PO TABS: 1.0000 | ORAL_TABLET | Freq: Four times a day (QID) | ORAL | Status: DC | PRN

## 2014-05-31 MED ORDER — HYDROMORPHONE HCL 1 MG/ML IJ SOLN
1.0000 mg | Freq: Once | INTRAMUSCULAR | Status: AC
  Administered 2014-05-31: 1 mg via INTRAMUSCULAR
  Filled 2014-05-31: qty 1

## 2014-05-31 NOTE — ED Provider Notes (Signed)
CSN: [id number]     Arrival date & time 05/31/14  1626 History   First MD Initiated Contact with Patient 05/31/14 1640     Chief Complaint  Patient presents with  . Leg Pain     (Consider location/radiation/quality/duration/timing/severity/associated sxs/prior Treatment) The history is provided by the patient and medical records. No language interpreter was used.     Kristen Woods is a 49 y.o. female  with a hx of HTN, left knee pain (from fall in 2013, Followed by [facility] until Nov 2015 - Dr. Paulo Fruit) presents to the Emergency Department complaining of acute, persistent, left leg pain onset 3PM.  Pt reports that she was walking to the time clock at work today when she developed left hip and leg pain, worse in the left buttock. She reports associated subjective weakness of the left leg stating that he feels very heavy.  Pt denies low back pain.  Pt reports pain begins in the left groin, radiates to the lateral side of the hip and down the leg.  Pt reports pain extends below her knee.  Pt reports associated tingling of the leg and then it went "numb."  Pt reports taking diclofenac and hydrocodone without relief. Nothing seems to make her symptoms better or worse. Patient denies fever, chills, headache, neck pain, chest pain, shortness of breath, abdominal pain, nausea, vomiting, diarrhea, back pain, syncope, dysuria.  Denies loss of bowel or bladder control. Denies saddle anesthesia.   Past Medical History  Diagnosis Date  . Hypertension   . Leg pain   . Nerves    Past Surgical History  Procedure Laterality Date  . Breast surgery    . Knee surgery    . Incontinence surgery     Family History  Problem Relation Age of Onset  . Hypertension Mother   . Diabetes Father   . Heart attack Neg Hx   . Sudden death Neg Hx   . Hyperlipidemia Other    History  Substance Use Topics  . Smoking status: Never Smoker   . Smokeless tobacco: Not on file  . Alcohol Use: No   OB History    No  data available     Review of Systems  Constitutional: Negative for fever and fatigue.  Respiratory: Negative for chest tightness and shortness of breath.   Cardiovascular: Negative for chest pain.  Gastrointestinal: Negative for nausea, vomiting, abdominal pain and diarrhea.  Genitourinary: Negative for dysuria, urgency, frequency and hematuria.  Musculoskeletal: Positive for arthralgias and gait problem ( 2/2 pain). Negative for back pain, joint swelling, neck pain and neck stiffness.  Skin: Negative for rash.  Neurological: Negative for weakness, light-headedness, numbness and headaches.  All other systems reviewed and are negative.     Allergies  Review of patient's allergies indicates no known allergies.  Home Medications   Prior to Admission medications   Medication Sig Start Date End Date Taking? Authorizing Provider  amoxicillin-clavulanate (AUGMENTIN) 875-125 MG per tablet Take 1 tablet by mouth 2 (two) times daily. One po bid x 7 days 05/15/14   Wandra Arthurs, MD  ascorbic acid (VITAMIN C) 500 MG tablet Take 500 mg by mouth daily.    Historical Provider, MD  Biotin 10 MG CAPS Take 1 tablet by mouth daily.    Historical Provider, MD  diclofenac (VOLTAREN) 75 MG EC tablet Take 75 mg by mouth 2 (two) times daily.    Historical Provider, MD  HYDROcodone-acetaminophen (NORCO/VICODIN) 5-325 MG per tablet Take 1 tablet by  mouth every 6 (six) hours as needed for moderate pain or severe pain. 05/15/14   Wandra Arthurs, MD  lisinopril (PRINIVIL,ZESTRIL) 10 MG tablet Take 10 mg by mouth daily.    Historical Provider, MD  lisinopril-hydrochlorothiazide (PRINZIDE,ZESTORETIC) 20-25 MG per tablet  07/09/12   Historical Provider, MD  nabumetone (RELAFEN) 750 MG tablet Take 750 mg by mouth 2 (two) times daily as needed.    Historical Provider, MD  valACYclovir (VALTREX) 500 MG tablet  07/19/12   Historical Provider, MD   BP 131/79 mmHg  Pulse 74  Temp(Src) 99 F (37.2 C) (Oral)  Resp 16  Ht 5'  6" (1.676 m)  Wt 265 lb (120.203 kg)  BMI 42.79 kg/m2  SpO2 97%  LMP 03/27/2014 Physical Exam  Constitutional: She appears well-developed and well-nourished. No distress.  HENT:  Head: Normocephalic and atraumatic.  Mouth/Throat: Oropharynx is clear and moist. No oropharyngeal exudate.  Eyes: Conjunctivae are normal.  Neck: Normal range of motion. Neck supple.  Full ROM without pain  Cardiovascular: Normal rate, regular rhythm, normal heart sounds and intact distal pulses.   No murmur heard. Pulmonary/Chest: Effort normal and breath sounds normal. No respiratory distress. She has no wheezes.  Abdominal: Soft. She exhibits no distension. There is no tenderness.  Genitourinary:  No saddle anesthesia  Musculoskeletal:  Full range of motion of the T-spine and L-spine No tenderness to palpation of the spinous processes of the T-spine or L-spine No tenderness to palpation of the paraspinous muscles of the L-spine Significant tenderness with reproduction of symptoms to the left buttock  Lymphadenopathy:    She has no cervical adenopathy.  Neurological: She is alert. A sensory deficit (subjective decrease in the LLE, able to feel sharp sensation) is present. No cranial nerve deficit. GCS eye subscore is 4. GCS verbal subscore is 5. GCS motor subscore is 6.  Speech is clear and goal oriented, follows commands Normal 5/5 strength in bilateral upper and right lower extremities including dorsiflexion and plantar flexion, strong and equal grip strength Decreased 3/5 strength in left lower extremity including dorsiflexion and plantar flexion Sensation subjectively decreased in the left lower extremity Moves extremities without ataxia, coordination intact Gait testing deferred No Clonus Unable to elicit patellar DTRs bilaterally due to patient weight.     Skin: Skin is warm and dry. No rash noted. She is not diaphoretic. No erythema.  Psychiatric: She has a normal mood and affect. Her behavior  is normal.  Nursing note and vitals reviewed.   ED Course  Procedures (including critical care time) Labs Review Labs Reviewed - No data to display  Imaging Review Dg Lumbar Spine Complete  05/31/2014   CLINICAL DATA:  Left leg pain.  EXAM: LUMBAR SPINE - COMPLETE 4+ VIEW  COMPARISON:  None.  FINDINGS: There is no evidence of lumbar spine fracture. Alignment is normal. Intervertebral disc spaces are maintained.  IMPRESSION: Negative.   Electronically Signed   By: Lucienne Capers M.D.   On: 05/31/2014 18:12   Dg Hip Unilat With Pelvis 2-3 Views Left  05/31/2014   CLINICAL DATA:  Left hip pain  EXAM: LEFT HIP (WITH PELVIS) 2-3 VIEWS  COMPARISON:  None.  FINDINGS: Hips are located. No evidence of left hip fracture or dislocation. There is sclerosis of the pubic symphysis.  IMPRESSION: No fracture dislocation of left hip.  No acute osseous abnormality.   Electronically Signed   By: Suzy Bouchard M.D.   On: 05/31/2014 18:11     EKG Interpretation  None      MDM   Final diagnoses:  Left hip pain  Left leg weakness   Kristen Woods presents with physical exam consistent with sciatica. Pain and radiating paresthesias consistently reproducible with palpation to the left buttock.  Pt with weakness in the left leg with c/o significant pain, likely sciatica. Will treat pain and attempt to ambulate patient.  6:45PM X-rays without evidence of acute abnormality. No fractures or dislocations. Specifically no compression fractures. Patient reports pain is not significantly improved. Will attempt ambulation.  Patient ambulated in the room. She is able to weight-bear on the left leg with significant pain in the left buttock and radiating down the left leg. She however has altered gait, dragging the left leg.  Continue to be unable to elicit patellar reflexes due to patient's weight.  She will need an MRI to rule out cord compression.  7:00PM Patient now with nausea and vomiting.  Zofran given.   Pt is to be transferred to [facility] for MRI L-spine.  Discussed with Dr. Dorie Rank who will accept patient in the ED at [facility].  The patient was discussed with and seen by Dr. Regenia Skeeter who agrees with the treatment plan.   Kristen Soho Labarron Durnin, PA-C 05/31/14 1949  Ephraim Hamburger, MD 06/01/14 0002

## 2014-05-31 NOTE — ED Provider Notes (Signed)
Patient has been evaluated by Abigail Butts, PA-C and Sherwood Gambler, MD at [facility] today for new onset of left leg pain with radiculopathy. She was having difficulty supporting her weight on her left leg and complaining of subjective paresthesia. No associated back pain, bowel bladder incontinence or saddle anesthesia. No fever or chills, no history of IV drug use or active cancer. Patient was sent here to [facility] ER for L-spine MRI to rule out cord compression. Patient is afebrile, no source of infection noted.  On examination this is a moderately obese African American female appears to be in no acute distress, laying in the bed. She has no significant midline spine tenderness, does have tenderness to left sacroiliac region on palpation without any overlying skin changes. Negative straight leg raise. She has normal left hip flexion-extension abduction and abduction. Difficult to elicit patellar deep tendon reflex likely due to positional change and body habitus. No foot drops. No palpable cords, erythema, or edema. Normal dorsiflexion of the great toe with intact distal pulse and brisk cap refill, sensations intact, no foot drop, normal  Babinski sign. doubt septic joint.     11:23 PM MRI of L-spine shows no acute fractures or malalignment. Degenerative change of the lumbar spine is demonstrated without canal stenosis. Evidence of herniated disc without cord compression. Left greater than right sacroiliitis noted. These finding was discussed with patient. Patient will be treated symptomatically and will follow-up probably with her PCP for further management. Work note provided. She is able to ambulate. Return precautions discussed.  BP 108/72 mmHg  Pulse 77  Temp(Src) 98.5 F (36.9 C) (Oral)  Resp 16  Ht 5\' 6"  (1.676 m)  Wt 265 lb (120.203 kg)  BMI 42.79 kg/m2  SpO2 98%  LMP 03/27/2014  I have reviewed nursing notes and vital signs. I personally reviewed the imaging tests  through PACS system  I reviewed available ER/hospitalization records thought the EMR  Results for orders placed or performed during the hospital encounter of 10/31/13  GC/Chlamydia Probe Amp  Result Value Ref Range   CT Probe RNA NEGATIVE NEGATIVE   GC Probe RNA NEGATIVE NEGATIVE  Wet prep, genital  Result Value Ref Range   Yeast Wet Prep HPF POC NONE SEEN NONE SEEN   Trich, Wet Prep MANY (A) NONE SEEN   Clue Cells Wet Prep HPF POC FEW (A) NONE SEEN   WBC, Wet Prep HPF POC MODERATE (A) NONE SEEN  Urinalysis, Routine w reflex microscopic  Result Value Ref Range   Color, Urine YELLOW YELLOW   APPearance CLOUDY (A) CLEAR   Specific Gravity, Urine 1.012 1.005 - 1.030   pH 5.5 5.0 - 8.0   Glucose, UA NEGATIVE NEGATIVE mg/dL   Hgb urine dipstick NEGATIVE NEGATIVE   Bilirubin Urine NEGATIVE NEGATIVE   Ketones, ur NEGATIVE NEGATIVE mg/dL   Protein, ur NEGATIVE NEGATIVE mg/dL   Urobilinogen, UA 0.2 0.0 - 1.0 mg/dL   Nitrite NEGATIVE NEGATIVE   Leukocytes, UA MODERATE (A) NEGATIVE  Pregnancy, urine  Result Value Ref Range   Preg Test, Ur NEGATIVE NEGATIVE  CBC with Differential  Result Value Ref Range   WBC 10.6 (H) 4.0 - 10.5 K/uL   RBC 3.82 (L) 3.87 - 5.11 MIL/uL   Hemoglobin 10.7 (L) 12.0 - 15.0 g/dL   HCT 32.7 (L) 36.0 - 46.0 %   MCV 85.6 78.0 - 100.0 fL   MCH 28.0 26.0 - 34.0 pg   MCHC 32.7 30.0 - 36.0 g/dL  RDW 14.0 11.5 - 15.5 %   Platelets 418 (H) 150 - 400 K/uL   Neutrophils Relative % 64 43 - 77 %   Neutro Abs 6.8 1.7 - 7.7 K/uL   Lymphocytes Relative 19 12 - 46 %   Lymphs Abs 2.0 0.7 - 4.0 K/uL   Monocytes Relative 13 (H) 3 - 12 %   Monocytes Absolute 1.3 (H) 0.1 - 1.0 K/uL   Eosinophils Relative 4 0 - 5 %   Eosinophils Absolute 0.4 0.0 - 0.7 K/uL   Basophils Relative 0 0 - 1 %   Basophils Absolute 0.0 0.0 - 0.1 K/uL  Comprehensive metabolic panel  Result Value Ref Range   Sodium 138 137 - 147 mEq/L   Potassium 3.8 3.7 - 5.3 mEq/L   Chloride 100 96 - 112  mEq/L   CO2 25 19 - 32 mEq/L   Glucose, Bld 100 (H) 70 - 99 mg/dL   BUN 18 6 - 23 mg/dL   Creatinine, Ser 1.20 (H) 0.50 - 1.10 mg/dL   Calcium 9.7 8.4 - 10.5 mg/dL   Total Protein 7.2 6.0 - 8.3 g/dL   Albumin 3.5 3.5 - 5.2 g/dL   AST 11 0 - 37 U/L   ALT 8 0 - 35 U/L   Alkaline Phosphatase 84 39 - 117 U/L   Total Bilirubin 0.4 0.3 - 1.2 mg/dL   GFR calc non Af Amer 53 (L) >90 mL/min   GFR calc Af Amer 61 (L) >90 mL/min   Anion gap 13 5 - 15  Lipase, blood  Result Value Ref Range   Lipase 34 11 - 59 U/L  Urine microscopic-add on  Result Value Ref Range   Squamous Epithelial / LPF FEW (A) RARE   WBC, UA 7-10 <3 WBC/hpf   Bacteria, UA FEW (A) RARE   Dg Lumbar Spine Complete  05/31/2014   CLINICAL DATA:  Left leg pain.  EXAM: LUMBAR SPINE - COMPLETE 4+ VIEW  COMPARISON:  None.  FINDINGS: There is no evidence of lumbar spine fracture. Alignment is normal. Intervertebral disc spaces are maintained.  IMPRESSION: Negative.   Electronically Signed   By: Lucienne Capers M.D.   On: 05/31/2014 18:12   Mr Lumbar Spine Wo Contrast  05/31/2014   CLINICAL DATA:  Acute persistent LEFT leg pain beginning at 3 p.m. today. Acute onset while walking at work. Subjective LEFT leg weakness and heaviness, numbness. History of LEFT knee pain after fall 2013.  EXAM: MRI LUMBAR SPINE WITHOUT CONTRAST  TECHNIQUE: Multiplanar, multisequence MR imaging of the lumbar spine was performed. No intravenous contrast was administered.  COMPARISON:  Lumbar spine radiographs May 31, 2014 at 1731 hours  FINDINGS: Lumbar vertebral bodies and posterior elements appear intact and aligned with maintenance of lumbar lordosis. Intervertebral discs demonstrate normal morphology, slight desiccation the lower lumbar discs. Minimal subacute to chronic discogenic endplate change at K8-1, L5-S1 (using the reference level of the last well-formed intervertebral disc as L5-S1). No STIR signal abnormality to suggest acute osseous process.  Scattered hemangiomata. Partially imaged LEFT greater than RIGHT low T1 and low T2 signal about the sacroiliac joints.  Conus medullaris terminates at L1-2 and appears normal in morphology and signal characteristics. Cauda equina is unremarkable. Included prevertebral and paraspinal soft tissues are nonsuspicious.  Level by level evaluation:  L1-2, L2-3, L3-4: No disc bulge, canal stenosis nor neural foraminal narrowing.  L4-5: 2 mm broad-based disc bulge, mild facet arthropathy and ligamentum flavum redundancy without canal stenosis. Mild  bilateral neural foraminal narrowing.  L5-S1: 2 mm broad-based disc bulge. Moderate facet arthropathy without canal stenosis. Minimal bilateral neural foraminal narrowing.  IMPRESSION: No acute fracture or malalignment.  Mild degenerative change of lumbar spine without canal stenosis. Minimal to mild L4-5 and L5-S1 neural foraminal narrowing.  LEFT greater than RIGHT partially imaged low signal about this sacroiliac joints which could reflect degenerative change or chronic sacroiliitis.   Electronically Signed   By: Elon Alas   On: 05/31/2014 22:46   Dg Toe 2nd Left  05/15/2014   CLINICAL DATA:  LEFT second toe injury 3 days ago. Initial encounter.  EXAM: LEFT SECOND TOE  COMPARISON:  None.  FINDINGS: There is no evidence of fracture or dislocation. There is no evidence of arthropathy or other focal bone abnormality. Soft tissues are unremarkable.  IMPRESSION: Negative.   Electronically Signed   By: Dereck Ligas M.D.   On: 05/15/2014 19:38   Dg Hip Unilat With Pelvis 2-3 Views Left  05/31/2014   CLINICAL DATA:  Left hip pain  EXAM: LEFT HIP (WITH PELVIS) 2-3 VIEWS  COMPARISON:  None.  FINDINGS: Hips are located. No evidence of left hip fracture or dislocation. There is sclerosis of the pubic symphysis.  IMPRESSION: No fracture dislocation of left hip.  No acute osseous abnormality.   Electronically Signed   By: Suzy Bouchard M.D.   On: 05/31/2014 18:11       Domenic Moras, PA-C 05/31/14 [place], MD 06/01/14 [phone]

## 2014-05-31 NOTE — ED Notes (Signed)
Pt reports left hip pain radiating down left leg x 1 day. Sts "it doesn't work."

## 2014-05-31 NOTE — Discharge Instructions (Signed)
You have been evaluated for your left hip pain and left leg weakness.  There are evidence of herniated disc in your back and inflammation of your joint in your left hip.  Take medication as prescribed.  Follow up with your doctor for further care.  Return if your condition worsen or if you have other concerns.     Sacroiliac Joint Dysfunction The sacroiliac joint connects the lower part of the spine (the sacrum) with the bones of the pelvis. CAUSES  Sometimes, there is no obvious reason for sacroiliac joint dysfunction. Other times, it may occur   During pregnancy.  After injury, such as:  Car accidents.  Sport-related injuries.  Work-related injuries.  Due to one leg being shorter than the other.  Due to other conditions that affect the joints, such as:  Rheumatoid arthritis.  Gout.  Psoriasis.  Joint infection (septic arthritis). SYMPTOMS  Symptoms may include:  Pain in the:  Lower back.  Buttocks.  Groin.  Thighs and legs.  Difficult sitting, standing, walking, lying, bending or lifting. DIAGNOSIS  A number of tests may be used to help diagnose the cause of sacroiliac joint dysfunction, including:  Imaging tests to look for other causes of pain, including:  MRI.  CT scan.  Bone scan.  Diagnostic injection: During a special x-ray (called fluoroscopy), a needle is put into the sacroiliac joint. A numbing medicine is injected into the joint. If the pain is improved or stopped, the diagnosis of sacroiliac joint dysfunction is more likely. TREATMENT  There are a number of types of treatment used for sacroiliac joint dysfunction, including:  Only take over-the-counter or prescription medicines for pain, discomfort, or fever as directed by your caregiver.  Medications to relax muscles.  Rest. Decreasing activity can help cut down on painful muscle spasms and allow the back to heal.  Application of heat or ice to the lower back may improve muscle spasms and  soothe pain.  Brace. A special back brace, called a sacroiliac belt, can help support the joint while your back is healing.  Physical therapy can help teach comfortable positions and exercises to strengthen muscles that support the sacroiliac joint.  Cortisone injections. Injections of steroid medicine into the joint can help decrease swelling and improve pain.  Hyaluronic acid injections. This chemical improves lubrication within the sacroiliac joint, thereby decreasing pain.  Radiofrequency ablation. A special needle is placed into the joint, where it burns away nerves that are carrying pain messages from the joint.  Surgery. Because pain occurs during movement of the joint, screws and plates may be installed in order to limit or prevent joint motion. HOME CARE INSTRUCTIONS   Take all medications exactly as directed.  Follow instructions regarding both rest and physical activity, to avoid worsening the pain.  Do physical therapy exercises exactly as prescribed. SEEK IMMEDIATE MEDICAL CARE IF:  You experience increasingly severe pain.  You develop new symptoms, such as numbness or tingling in your legs or feet.  You lose bladder or bowel control. Document Released: 07/04/2008 Document Revised: 06/30/2011 Document Reviewed: 07/04/2008 [facility] Patient Information 2015 [place], [place]. This information is not intended to replace advice given to you by your health care provider. Make sure you discuss any questions you have with your health care provider.

## 2014-05-31 NOTE — ED Notes (Signed)
Pt ambulated in hallway with minimal assistance. Pt does complain of pain to left leg however no unsteady gait and patient denies any lightheadedness or dizziness.

## 2014-10-05 ENCOUNTER — Ambulatory Visit (INDEPENDENT_AMBULATORY_CARE_PROVIDER_SITE_OTHER): Payer: 59 | Admitting: Neurology

## 2014-10-05 ENCOUNTER — Encounter: Payer: Self-pay | Admitting: Neurology

## 2014-10-05 VITALS — BP 118/78 | HR 76 | Resp 16 | Ht 66.0 in | Wt 277.4 lb

## 2014-10-05 DIAGNOSIS — M25512 Pain in left shoulder: Secondary | ICD-10-CM

## 2014-10-05 DIAGNOSIS — M542 Cervicalgia: Secondary | ICD-10-CM

## 2014-10-05 DIAGNOSIS — M79602 Pain in left arm: Secondary | ICD-10-CM | POA: Diagnosis not present

## 2014-10-05 DIAGNOSIS — R208 Other disturbances of skin sensation: Secondary | ICD-10-CM

## 2014-10-05 MED ORDER — GABAPENTIN 300 MG PO CAPS: ORAL_CAPSULE | ORAL | Status: DC

## 2014-10-05 NOTE — Progress Notes (Signed)
[facility]  PATIENT: Kristen Woods DOB: 1965-11-10  REFERRING DOCTOR OR PCP:  Annetta Maw, MD Romelle Starcher, fax:  [phone]) SOURCE: patient and records form [facility]  _________________________________   HISTORICAL  CHIEF COMPLAINT:  Chief Complaint  Patient presents with  . Neck Pain    Sts. she has had neck pain for years.  Sts. onset onde month ago of numbness/tingling both hands, all fingers, left side of face.  One side is not worse than the other.  Sts. nothing improves or worsens sx.  Denies imaging studies of neck/back.  Sts. she believes sx. are related to a fall she sustained at work in 2013.  Sts. she fell down 4 steps onto a concrete floor.  Sts. that fall also resulted in left knee injuries that required surgery, and left shoulder injury./fim    HISTORY OF PRESENT ILLNESS:  I had the pleasure of seeing your patient, Kristen Woods, at [facility] for neurologic consultation regarding her neck pain and lower back pain.  In 2013, she reports falling down stairs and she states pain started at that time.   She notes she had left knee pain and left neck/shoulder pain.  She also states she has had left lateral thigh numbness since that time.    Current pain is radiating into the left arm into the fingers (all of them) and she also noted left facial tingling at times.    She notes mild weakness in the left arm.   She feels she needs to sometimes lift items with both arms that she used to be able to lift by the left arm alone.   She feels the entire left side (arm and leg) are more sensitive to pain and touch.     No cervical spine imaging is available  In February, the reports that the left leg gave out and seemed to stop working.  She reported numbness as well She went to [facility] ED and was evaluated.    She was given pain medications and had an MRI.   I personally reviewed the MRI images for the lumbar spine dated  05/31/2014 performed at [facility]. There are minimal degenerative changes at L4-L5 that did not lead to nerve root impingement.    Currently, she reports numbness in the hands (bilateral but left > right), lower left face, left knee pain, numbness/soreness in the outer left thigh, left shoulder pain and neck pain.    She reports that the shoulder pain increases with her arm over her head or externally rotated.     She notes bladder frequency and had a sling procedure in the past. She does not think the bladder symptoms have changed much this year.  In the past, she was on gabapentin with some benefit at first.   She has not received any benefit from NSAID.   She also states Percocet helped some at first but then stopped helping so she stopped.      REVIEW OF SYSTEMS: Constitutional: No fevers, chills, sweats, or change in appetite Eyes: No visual changes, double vision, eye pain Ear, nose and throat: No hearing loss, ear pain, nasal congestion, sore throat Cardiovascular: No chest pain, palpitations Respiratory: No shortness of breath at rest or with exertion.   No wheezes GastrointestinaI: No nausea, vomiting, diarrhea, abdominal pain, fecal incontinence Genitourinary: No dysuria, urinary retention.   Notes frequency. Musculoskeletal: as above Integumentary: No rash, pruritus, skin lesions Neurological: as above Psychiatric: No depression at this time.  No anxiety Endocrine: No palpitations, diaphoresis, change in appetite, change in weigh or increased thirst Hematologic/Lymphatic: No anemia, purpura, petechiae. Allergic/Immunologic: No itchy/runny eyes, nasal congestion, recent allergic reactions, rashes  ALLERGIES: No Known Allergies  HOME MEDICATIONS:  Current outpatient prescriptions:  .  ascorbic acid (VITAMIN C) 500 MG tablet, Take 500 mg by mouth daily., Disp: , Rfl:  .  atorvastatin (LIPITOR) 20 MG tablet, , Disp: , Rfl:  .  lisinopril-hydrochlorothiazide  (PRINZIDE,ZESTORETIC) 20-25 MG per tablet, Take 1 tablet by mouth daily. , Disp: , Rfl:  .  Multiple Vitamins-Minerals (ALIVE WOMENS 50+ PO), Take 1 tablet by mouth 2 (two) times daily., Disp: , Rfl:  .  Biotin 10 MG CAPS, Take 1 tablet by mouth daily., Disp: , Rfl:  .  diclofenac (VOLTAREN) 75 MG EC tablet, Take 1 tablet (75 mg total) by mouth 2 (two) times daily as needed for moderate pain. (Patient not taking: Reported on 10/05/2014), Disp: 30 tablet, Rfl: 0 .  HYDROcodone-acetaminophen (NORCO/VICODIN) 5-325 MG per tablet, Take 1 tablet by mouth every 6 (six) hours as needed for moderate pain or severe pain. (Patient not taking: Reported on 10/05/2014), Disp: 10 tablet, Rfl: 0  PAST MEDICAL HISTORY: Past Medical History  Diagnosis Date  . Hypertension   . Leg pain   . Nerves   . Liver disease   . Vision abnormalities     PAST SURGICAL HISTORY: Past Surgical History  Procedure Laterality Date  . Breast surgery    . Knee surgery    . Incontinence surgery      FAMILY HISTORY: Family History  Problem Relation Age of Onset  . Hypertension Mother   . Diabetes Father   . Heart attack Neg Hx   . Sudden death Neg Hx   . Hyperlipidemia Other     SOCIAL HISTORY:  History   Social History  . Marital Status: Single    Spouse Name: N/A  . Number of Children: N/A  . Years of Education: N/A   Occupational History  . Not on file.   Social History Main Topics  . Smoking status: Never Smoker   . Smokeless tobacco: Not on file  . Alcohol Use: No  . Drug Use: No  . Sexual Activity: Not on file   Other Topics Concern  . Not on file   Social History Narrative     PHYSICAL EXAM  Filed Vitals:   10/05/14 0837  BP: 118/78  Pulse: 76  Resp: 16  Height: 5\' 6"  (1.676 m)  Weight: 277 lb 6.4 oz (125.828 kg)    Body mass index is 44.79 kg/(m^2).   General: The patient is well-developed and well-nourished and in no acute distress  Eyes:  Funduscopic exam shows normal  optic discs and retinal vessels.  Neck: The neck is supple, no carotid bruits are noted.  The neck is nontender.  Cardiovascular: The heart has a regular rate and rhythm with a normal S1 and S2. There were no murmurs, gallops or rubs. Lungs are clear to auscultation.  Skin: Extremities are without significant edema.  Musculoskeletal:  Back is nontender  Neurologic Exam  Mental status: The patient is alert and oriented x 3 at the time of the examination. The patient has apparent normal recent and remote memory, with an apparently normal attention span and concentration ability.   Speech is normal.  Cranial nerves: Extraocular movements are full. Pupils are equal, round, and reactive to light and accomodation.  Visual fields are full.  Facial  symmetry is present. There is reduced facial sensation to temperature and touch over left V2 and V3 distribution.  She also reports present vibration over the right forehead and absent sensation over the left forehead.  .Facial strength is normal.  Trapezius and sternocleidomastoid strength is normal. No dysarthria is noted.  The tongue is midline, and the patient has symmetric elevation of the soft palate. No obvious hearing deficits are noted.  Motor:  Muscle bulk is normal.   Tone is normal. Strength is  5 / 5 in all 4 extremities.   Sensory: Sensory testing she reports that a cold tuning fork feels painful on the left arm.   Vibration feels stronger in left hand She repots absent touch sensation in both thighs (outer and inner).  She reports that touch is painful in left lower leg and that the left touch feel stronger and painful on the toes.   She felt vibratory sensation was much higher on the left side  Coordination: Cerebellar testing reveals good finger-nose-finger and heel-to-shin bilaterally.  Gait and station: Station is normal.   Gait is normal. Tandem gait is mildly wide. Romberg is negative.   Reflexes: Deep tendon reflexes are symmetric and  normal bilaterally.   Plantar responses are flexor.    DIAGNOSTIC DATA (LABS, IMAGING, TESTING) - I reviewed patient records, labs, notes, testing and imaging myself where available.  Lab Results  Component Value Date   WBC 10.6* 10/31/2013   HGB 10.7* 10/31/2013   HCT 32.7* 10/31/2013   MCV 85.6 10/31/2013   PLT 418* 10/31/2013      Component Value Date/Time   NA 138 10/31/2013 2045   K 3.8 10/31/2013 2045   CL 100 10/31/2013 2045   CO2 25 10/31/2013 2045   GLUCOSE 100* 10/31/2013 2045   BUN 18 10/31/2013 2045   CREATININE 1.20* 10/31/2013 2045   CALCIUM 9.7 10/31/2013 2045   PROT 7.2 10/31/2013 2045   ALBUMIN 3.5 10/31/2013 2045   AST 11 10/31/2013 2045   ALT 8 10/31/2013 2045   ALKPHOS 84 10/31/2013 2045   BILITOT 0.4 10/31/2013 2045   GFRNONAA 53* 10/31/2013 2045   GFRAA 61* 10/31/2013 2045       ASSESSMENT AND PLAN  Neck pain - Plan: MR Cervical Spine Wo Contrast  Left arm pain - Plan: MR Cervical Spine Wo Contrast  Dysesthesia - Plan: MR Cervical Spine Wo Contrast  Shoulder pain, left   In summary, Kristen Woods is a 49 year old woman who reports neck pain, left arm pain and left shoulder pain that she believes began after a fall in 2013. On examination, she had normal strength and reflexes but reported some asymmetry in sensation.  These changes are subjective and there could be some functional overlay based on some of the results (reporting asymmetry of vibration in the forehead).   As she reported symptoms in the left arm as well as the legs,  we need to get an MRI of the cervical spine to make sure that there is not a myelopathy or other cord process.  I started gabapentin 300 mg by mouth every morning, 300 mg by mouth every afternoon and 600 mg by mouth daily at bedtime. Hopefully, this will help some of the dysesthetic tingling. I let her know that it is impossible for me to know whether there is any relationship at all between her current symptoms and  events of 2013.    She will return to see me in 2 months or sooner if she  has new or worsening neurologic symptoms.   Laurey Salser A. Felecia Shelling, MD, PhD 3/87/5643, 3:29 AM Certified in Neurology, Clinical Neurophysiology, Sleep Medicine, Pain Medicine and Neuroimaging  [facility] [address], [place], [place] [address] [phone]

## 2014-10-11 ENCOUNTER — Ambulatory Visit (INDEPENDENT_AMBULATORY_CARE_PROVIDER_SITE_OTHER): Payer: 59

## 2014-10-11 DIAGNOSIS — M542 Cervicalgia: Secondary | ICD-10-CM

## 2014-10-11 DIAGNOSIS — M79602 Pain in left arm: Secondary | ICD-10-CM | POA: Diagnosis not present

## 2014-10-11 DIAGNOSIS — R208 Other disturbances of skin sensation: Secondary | ICD-10-CM | POA: Diagnosis not present

## 2014-10-13 ENCOUNTER — Telehealth: Payer: Self-pay | Admitting: *Deleted

## 2014-10-13 MED ORDER — METHYLPREDNISOLONE 4 MG PO TBPK: ORAL_TABLET | ORAL | Status: DC

## 2014-10-13 NOTE — Telephone Encounter (Signed)
I have spoken with Kristen Woods and per RAS, advised that mri shows degen changes, no def. nerve root compression, although there may be some compression that is not seen on mri.  She sts. she is still having arm numbness/pain--I have offered Medrol dose pk. and she is agreeable to trying this.  Rx. called to [facility] per her request/fim

## 2014-10-13 NOTE — Telephone Encounter (Signed)
-----   Message from Britt Bottom, MD sent at 10/13/2014 12:33 PM EDT ----- MRI shows degnerative changes more to the left --- no definite nerve root compression though one of the left nerve roots has less room than it should  If pain in arm not better, try steroid pack

## 2014-11-07 ENCOUNTER — Encounter (HOSPITAL_BASED_OUTPATIENT_CLINIC_OR_DEPARTMENT_OTHER): Payer: Self-pay | Admitting: Emergency Medicine

## 2014-11-07 ENCOUNTER — Emergency Department (HOSPITAL_BASED_OUTPATIENT_CLINIC_OR_DEPARTMENT_OTHER): Payer: 59

## 2014-11-07 ENCOUNTER — Emergency Department (HOSPITAL_BASED_OUTPATIENT_CLINIC_OR_DEPARTMENT_OTHER)
Admission: EM | Admit: 2014-11-07 | Discharge: 2014-11-07 | Disposition: A | Discharge status: Home / Self Care | Payer: 59 | Attending: Emergency Medicine | Admitting: Emergency Medicine

## 2014-11-07 DIAGNOSIS — Z7952 Long term (current) use of systemic steroids: Secondary | ICD-10-CM | POA: Insufficient documentation

## 2014-11-07 DIAGNOSIS — Z8669 Personal history of other diseases of the nervous system and sense organs: Secondary | ICD-10-CM | POA: Insufficient documentation

## 2014-11-07 DIAGNOSIS — E669 Obesity, unspecified: Secondary | ICD-10-CM | POA: Insufficient documentation

## 2014-11-07 DIAGNOSIS — R079 Chest pain, unspecified: Secondary | ICD-10-CM | POA: Insufficient documentation

## 2014-11-07 DIAGNOSIS — Z8719 Personal history of other diseases of the digestive system: Secondary | ICD-10-CM | POA: Insufficient documentation

## 2014-11-07 DIAGNOSIS — I10 Essential (primary) hypertension: Secondary | ICD-10-CM | POA: Diagnosis not present

## 2014-11-07 DIAGNOSIS — Z79899 Other long term (current) drug therapy: Secondary | ICD-10-CM | POA: Insufficient documentation

## 2014-11-07 LAB — BASIC METABOLIC PANEL
Anion gap: 7 (ref 5–15)
BUN: 16 mg/dL (ref 6–20)
CHLORIDE: 103 mmol/L (ref 101–111)
CO2: 28 mmol/L (ref 22–32)
CREATININE: 1.18 mg/dL — AB (ref 0.44–1.00)
Calcium: 9 mg/dL (ref 8.9–10.3)
GFR calc Af Amer: >60 mL/min (ref >60)
GFR, EST NON AFRICAN AMERICAN: 53 mL/min — AB (ref >60)
GLUCOSE: 97 mg/dL (ref 65–99)
POTASSIUM: 3.3 mmol/L — AB (ref 3.5–5.1)
Sodium: 138 mmol/L (ref 135–145)

## 2014-11-07 LAB — URINE MICROSCOPIC-ADD ON

## 2014-11-07 LAB — CBC WITH DIFFERENTIAL/PLATELET
BASOS ABS: 0 10*3/uL (ref 0.0–0.1)
Basophils Relative: 0 % (ref 0–1)
Eosinophils Absolute: 0.6 10*3/uL (ref 0.0–0.7)
Eosinophils Relative: 7 % — ABNORMAL HIGH (ref 0–5)
HCT: 36.5 % (ref 36.0–46.0)
Hemoglobin: 11.8 g/dL — ABNORMAL LOW (ref 12.0–15.0)
LYMPHS ABS: 3.2 10*3/uL (ref 0.7–4.0)
Lymphocytes Relative: 34 % (ref 12–46)
MCH: 28 pg (ref 26.0–34.0)
MCHC: 32.3 g/dL (ref 30.0–36.0)
MCV: 86.7 fL (ref 78.0–100.0)
MONOS PCT: 12 % (ref 3–12)
Monocytes Absolute: 1.1 10*3/uL — ABNORMAL HIGH (ref 0.1–1.0)
Neutro Abs: 4.3 10*3/uL (ref 1.7–7.7)
Neutrophils Relative %: 47 % (ref 43–77)
Platelets: 417 10*3/uL — ABNORMAL HIGH (ref 150–400)
RBC: 4.21 MIL/uL (ref 3.87–5.11)
RDW: 13.9 % (ref 11.5–15.5)
WBC: 9.2 10*3/uL (ref 4.0–10.5)

## 2014-11-07 LAB — URINALYSIS, ROUTINE W REFLEX MICROSCOPIC
Bilirubin Urine: NEGATIVE
Glucose, UA: NEGATIVE mg/dL
Hgb urine dipstick: NEGATIVE
Ketones, ur: NEGATIVE mg/dL
NITRITE: NEGATIVE
Protein, ur: NEGATIVE mg/dL
SPECIFIC GRAVITY, URINE: 1.019 (ref 1.005–1.030)
UROBILINOGEN UA: 0.2 mg/dL (ref 0.0–1.0)
pH: 6.5 (ref 5.0–8.0)

## 2014-11-07 LAB — TROPONIN I: Troponin I: <0.03 ng/mL (ref <0.031)

## 2014-11-07 NOTE — ED Notes (Signed)
Patient states that she started to have pain to her left chest with radiation to her left upper back. The patient denies any SOB but does report Nausea when she takes a deep breath. The patient denies any increased pain with movement reports that it "stayes the same"

## 2014-11-07 NOTE — ED Provider Notes (Signed)
CSN: [id number]     Arrival date & time 11/07/14  2032 History  This chart was scribed for Davonna Belling, MD by Irene Pap, ED Scribe. This patient was seen in room MH12/MH12 and patient care was started at 8:53 PM.   Chief Complaint  Patient presents with  . Chest Pain   The history is provided by the patient. No language interpreter was used.   HPI Comments: Kristen Woods is a 49 y.o. female who presents to the Emergency Department complaining of pressured constant left upper chest pain onset that intermittently radiates to back and down left leg onset 9 hours ago. States that she has been feeling very hot. States that nothing makes the pain better or worse but movement makes the chest pain feel more like a "weight is on my chest." Reports deep breathing causes her to be nauseous. Denies diaphoresis, SOB, lower leg swelling, recent long travel, or recent injuries. Denies intake of hormones or hx of smoking.    Past Medical History  Diagnosis Date  . Hypertension   . Leg pain   . Nerves   . Liver disease   . Vision abnormalities    Past Surgical History  Procedure Laterality Date  . Breast surgery    . Knee surgery    . Incontinence surgery     Family History  Problem Relation Age of Onset  . Hypertension Mother   . Diabetes Father   . Heart attack Neg Hx   . Sudden death Neg Hx   . Hyperlipidemia Other    History  Substance Use Topics  . Smoking status: Never Smoker   . Smokeless tobacco: Not on file  . Alcohol Use: No   OB History    No data available     Review of Systems  Constitutional: Negative for diaphoresis.  Respiratory: Negative for shortness of breath.   Cardiovascular: Positive for chest pain.  Musculoskeletal: Positive for back pain.  All other systems reviewed and are negative.  Allergies  Review of patient's allergies indicates no known allergies.  Home Medications   Prior to Admission medications   Medication Sig Start Date End Date  Taking? Authorizing Provider  ascorbic acid (VITAMIN C) 500 MG tablet Take 500 mg by mouth daily.    Historical Provider, MD  atorvastatin (LIPITOR) 20 MG tablet  08/23/14   Historical Provider, MD  Biotin 10 MG CAPS Take 1 tablet by mouth daily.    Historical Provider, MD  diclofenac (VOLTAREN) 75 MG EC tablet Take 1 tablet (75 mg total) by mouth 2 (two) times daily as needed for moderate pain. Patient not taking: Reported on 10/05/2014 05/31/14   Domenic Moras, PA-C  gabapentin (NEURONTIN) 300 MG capsule One in am, one in evening and two at bedtime po 10/05/14   Britt Bottom, MD  HYDROcodone-acetaminophen (NORCO/VICODIN) 5-325 MG per tablet Take 1 tablet by mouth every 6 (six) hours as needed for moderate pain or severe pain. Patient not taking: Reported on 10/05/2014 05/31/14   Domenic Moras, PA-C  lisinopril-hydrochlorothiazide (PRINZIDE,ZESTORETIC) 20-25 MG per tablet Take 1 tablet by mouth daily.  07/09/12   Historical Provider, MD  methylPREDNISolone (MEDROL DOSEPAK) 4 MG TBPK tablet Take 6 tablets on day one, then decrease by one tablet each day until gone. 10/13/14   Britt Bottom, MD  Multiple Vitamins-Minerals (ALIVE WOMENS 50+ PO) Take 1 tablet by mouth 2 (two) times daily.    Historical Provider, MD   BP 119/72 mmHg  Pulse 83  Temp(Src) 99.2 F (37.3 C) (Oral)  Resp 20  Ht 5\' 6"  (1.676 m)  Wt 281 lb (127.461 kg)  BMI 45.38 kg/m2  SpO2 100%  LMP 10/20/2014  Physical Exam  Constitutional: She is oriented to person, place, and time. She appears well-developed and well-nourished.  obese  HENT:  Head: Normocephalic and atraumatic.  Eyes: EOM are normal. Pupils are equal, round, and reactive to light.  Neck: Normal range of motion. Neck supple. No JVD present.  Cardiovascular: Normal rate and regular rhythm.   Pulmonary/Chest: Effort normal and breath sounds normal.  Tender to left upper chest that radiates to left flank/left lower chest area  Abdominal: Soft. There is no tenderness.   Musculoskeletal: Normal range of motion.  No peripheral edema  Neurological: She is alert and oriented to person, place, and time.  Skin: Skin is warm and dry.  Psychiatric: She has a normal mood and affect. Her behavior is normal.  Nursing note and vitals reviewed.   ED Course  Procedures (including critical care time) DIAGNOSTIC STUDIES: Oxygen Saturation is 100% on RA, normal by my interpretation.    COORDINATION OF CARE: 8:55 PM-Discussed treatment plan which includes EKG and labs with pt at bedside and pt agreed to plan.   Labs Review Labs Reviewed  CBC WITH DIFFERENTIAL/PLATELET - Abnormal; Notable for the following:    Hemoglobin 11.8 (*)    Platelets 417 (*)    Monocytes Absolute 1.1 (*)    Eosinophils Relative 7 (*)    All other components within normal limits  BASIC METABOLIC PANEL - Abnormal; Notable for the following:    Potassium 3.3 (*)    Creatinine, Ser 1.18 (*)    GFR calc non Af Amer 53 (*)    All other components within normal limits  URINALYSIS, ROUTINE W REFLEX MICROSCOPIC (NOT AT [facility]) - Abnormal; Notable for the following:    Leukocytes, UA TRACE (*)    All other components within normal limits  TROPONIN I  URINE MICROSCOPIC-ADD ON    Imaging Review Dg Chest 2 View  11/07/2014   CLINICAL DATA:  Initial evaluation for acute left-sided chest pain.  EXAM: CHEST  2 VIEW  COMPARISON:  None.  FINDINGS: Cardiac and mediastinal silhouettes are within normal limits.  Lungs are mildly hypoinflated. Patchy and linear opacities within the retrocardiac left lower lobe noted, most likely apple recess. No definite focal airspace disease identified. No pulmonary edema or pleural effusion. There is no pneumothorax.  No acute osseous abnormality.  IMPRESSION: 1. Mild left basilar atelectasis. 2. No other active cardiopulmonary disease.   Electronically Signed   By: Jeannine Boga M.D.   On: 11/07/2014 21:32     EKG Interpretation   Date/Time:  Tuesday November 07 2014 20:42:04 EDT Ventricular Rate:  86 PR Interval:  142 QRS Duration: 92 QT Interval:  400 QTC Calculation: 478 R Axis:   3 Text Interpretation:  Sinus rhythm with occasional Premature ventricular  complexes Incomplete right bundle branch block Nonspecific T wave  abnormality Prolonged QT Abnormal ECG Confirmed by Alvino Chapel  MD, Ovid Curd  [phone]) on 11/07/2014 11:18:12 PM      MDM   Final diagnoses:  Chest pain, unspecified chest pain type    Patient with chest pain. EKG reassuring and enzymes negative. X-ray also reassuring. Doubt cardiac cause or pulmonary embolism as the cause. No urine abnormalities also for left flank pain. Will discharge home. Will follow her PCP. I personally performed the services described in this documentation,  which was scribed in my presence. The recorded information has been reviewed and is accurate.     Davonna Belling, MD 11/07/14 [phone]

## 2014-11-07 NOTE — Discharge Instructions (Signed)

## 2014-11-07 NOTE — ED Notes (Signed)
D/c instructions reviewed w/ pt and family - pt and family deny any further questions or concerns at present. Pt ambulating independently w/ steady gait on d/c in no acute distress, A&Ox4.  

## 2014-12-05 ENCOUNTER — Ambulatory Visit: Payer: 59 | Admitting: Neurology

## 2014-12-14 ENCOUNTER — Encounter: Payer: Self-pay | Admitting: Neurology

## 2015-01-12 ENCOUNTER — Ambulatory Visit (INDEPENDENT_AMBULATORY_CARE_PROVIDER_SITE_OTHER): Payer: 59 | Admitting: Neurology

## 2015-01-12 ENCOUNTER — Encounter: Payer: Self-pay | Admitting: Neurology

## 2015-01-12 VITALS — BP 100/68 | HR 60 | Resp 18 | Ht 66.0 in | Wt 277.8 lb

## 2015-01-12 DIAGNOSIS — R208 Other disturbances of skin sensation: Secondary | ICD-10-CM | POA: Diagnosis not present

## 2015-01-12 DIAGNOSIS — M545 Low back pain, unspecified: Secondary | ICD-10-CM | POA: Insufficient documentation

## 2015-01-12 DIAGNOSIS — M5440 Lumbago with sciatica, unspecified side: Secondary | ICD-10-CM | POA: Diagnosis not present

## 2015-01-12 DIAGNOSIS — M79602 Pain in left arm: Secondary | ICD-10-CM

## 2015-01-12 DIAGNOSIS — M542 Cervicalgia: Secondary | ICD-10-CM | POA: Diagnosis not present

## 2015-01-12 MED ORDER — CYCLOBENZAPRINE HCL 10 MG PO TABS
10.0000 mg | ORAL_TABLET | Freq: Three times a day (TID) | ORAL | Status: DC | PRN
Start: 2015-01-12 — End: 2022-03-19

## 2015-01-12 NOTE — Progress Notes (Signed)
[facility]  PATIENT: Kristen Woods DOB: [birth year]  REFERRING DOCTOR OR PCP:  Annetta Maw, MD Kristen Woods, fax:  [phone]) SOURCE: patient and records form [facility]  _________________________________   HISTORICAL  CHIEF COMPLAINT:  Chief Complaint  Patient presents with  . Neck Pain    Last seen in June for neck, back pain onset yrs. ago and worse since a fall in 2013.  Dr. Felecia Shelling gave Gabapentin 300-300-600 but she sts. she doesn't feel this helps.  MRI of c-spine showed degenerative changes.  She tried a Medrol dose pk. and sts. this gave temporary relief.  Sts. today she is here more for lower back pain that she sts. radiates into bilat buttocks, hips,  down  anterior and lateral aspects of left leg to the toes.  She had an MRI L-spine in Feb. 2016 and sts. there has been no change   . Back Pain    in pain since that mri./fim    HISTORY OF PRESENT ILLNESS:  Kristen Woods is a 49 yo woman with neck and back pain.  At the last visit, we started gabapentin but she reports it has not helped.     Neck pain:   She reports stiffness in the neck in the mornings.     She is not having arm pain.    MRI showed DJD C4C5, C5C6 and C6C7 with some foraminal narrowing to the left at [facility] but no nerve root compression.   She feels strength is fine.   She notes numbness in her hands.          Back and leg pain:   Back and neck pain are similar but she repots leg pain is worse than arm pain.  She reports pain in her left leg since the fall in February 2013.   She states pain is in buttock and goes down the left leg into the entire leg and the entire foot.  She reports 2 episodes when the leg gave out,one in February and one in April 2016.   I personally reviewed the MRI images for the lumbar spine at the last visit dated 05/31/2014 performed at [facility]. MRI shows minimal degenerative changes at L4-L5 that did not lead to nerve root impingement.     Back/Neck History from prior visits:   In 2013, she reports falling down stairs and she states pain started at that time.   She notes she had left knee pain and left neck/shoulder pain.  She also states she has had left lateral thigh numbness since that time.    On 10/05/14, she reported pain was radiating into the left arm into the fingers (all of them) and she also noted left facial tingling at times.    She noted mild weakness in the left arm.   She feels she needs to sometimes lift items with both arms that she used to be able to lift by the left arm alone.   She felt the entire left side (arm and leg) are more sensitive to pain and touch.  She notes bladder frequency and had a sling procedure in the past. She reports she has occasional incontinence.  She reports difficulty with her sleep.   She needs to move the left leg to get comfortable.      In the past, she has not received any benefit from NSAID.   She also states Percocet helped some at first but then stopped helping so she stopped.   She reports  she has done PT 4 times in the past without any benefit  She reports having a NCV/EMG of the left arm a few years ago and Kristen Woods tells me it was very abnormal but she does not know the details   MRI cervical spine 10/13/2014 was reviewed and I concur with results: IMPRESSION: This is an abnormal MRI of the cervical spine showing the following: 1. At C4-C5, there are mild degenerative changes that do not lead to any nerve root impingement.  2. At C5-C6 there is a right paramedian disc protrusion effacing the right side of the thecal sac without causing spinal cord compression. There is left greater than right uncovertebral spurring. Both neural foramina are mildly narrowed but there does not appear to be any nerve root impingement. 3. At C6-C7, moderate left uncovertebral spurring and mild disc bulging causing moderate foraminal narrowing on the left. There is no definite nerve root  compression though there is some encroachment on the exiting left C7 nerve root.  REVIEW OF SYSTEMS: Constitutional: No fevers, chills, sweats, or change in appetite Eyes: No visual changes, double vision, eye pain Ear, nose and throat: No hearing loss, ear pain, nasal congestion, sore throat Cardiovascular: No chest pain, palpitations Respiratory: No shortness of breath at rest or with exertion.   No wheezes GastrointestinaI: No nausea, vomiting, diarrhea, abdominal pain, fecal incontinence Genitourinary: No dysuria, urinary retention.   Notes frequency. Musculoskeletal: as above Integumentary: No rash, pruritus, skin lesions Neurological: as above Psychiatric: No depression at this time.  No anxiety Endocrine: No palpitations, diaphoresis, change in appetite, change in weigh or increased thirst Hematologic/Lymphatic: No anemia, purpura, petechiae. Allergic/Immunologic: No itchy/runny eyes, nasal congestion, recent allergic reactions, rashes  ALLERGIES: No Known Allergies  HOME MEDICATIONS:  Current outpatient prescriptions:  .  ascorbic acid (VITAMIN C) 500 MG tablet, Take 500 mg by mouth daily., Disp: , Rfl:  .  atorvastatin (LIPITOR) 20 MG tablet, , Disp: , Rfl:  .  Biotin 10 MG CAPS, Take 1 tablet by mouth daily., Disp: , Rfl:  .  diclofenac (VOLTAREN) 75 MG EC tablet, Take 1 tablet (75 mg total) by mouth 2 (two) times daily as needed for moderate pain., Disp: 30 tablet, Rfl: 0 .  gabapentin (NEURONTIN) 300 MG capsule, One in am, one in evening and two at bedtime po, Disp: 120 capsule, Rfl: 11 .  HYDROcodone-acetaminophen (NORCO/VICODIN) 5-325 MG per tablet, Take 1 tablet by mouth every 6 (six) hours as needed for moderate pain or severe pain., Disp: 10 tablet, Rfl: 0 .  lisinopril-hydrochlorothiazide (PRINZIDE,ZESTORETIC) 20-25 MG per tablet, Take 1 tablet by mouth daily. , Disp: , Rfl:  .  Multiple Vitamins-Minerals (ALIVE WOMENS 50+ PO), Take 1 tablet by mouth 2 (two) times  daily., Disp: , Rfl:  .  oxybutynin (DITROPAN XL) 10 MG 24 hr tablet, Take 10 mg by mouth., Disp: , Rfl:  .  methylPREDNISolone (MEDROL DOSEPAK) 4 MG TBPK tablet, Take 6 tablets on day one, then decrease by one tablet each day until gone. (Patient not taking: Reported on 01/12/2015), Disp: 21 tablet, Rfl: 0  PAST MEDICAL HISTORY: Past Medical History  Diagnosis Date  . Hypertension   . Leg pain   . Nerves   . Liver disease   . Vision abnormalities     PAST SURGICAL HISTORY: Past Surgical History  Procedure Laterality Date  . Breast surgery    . Knee surgery    . Incontinence surgery      FAMILY HISTORY: Family  History  Problem Relation Age of Onset  . Hypertension Mother   . Diabetes Father   . Heart attack Neg Hx   . Sudden death Neg Hx   . Hyperlipidemia Other     SOCIAL HISTORY:  Social History   Social History  . Marital Status: Single    Spouse Name: N/A  . Number of Children: N/A  . Years of Education: N/A   Occupational History  . Not on file.   Social History Main Topics  . Smoking status: Never Smoker   . Smokeless tobacco: Not on file  . Alcohol Use: No  . Drug Use: No  . Sexual Activity: Not on file   Other Topics Concern  . Not on file   Social History Narrative     PHYSICAL EXAM  Filed Vitals:   01/12/15 1001  BP: 100/68  Pulse: 60  Resp: 18  Height: 5\' 6"  (1.676 m)  Weight: 277 lb 12.8 oz (126.009 kg)    Body mass index is 44.86 kg/(m^2).   General: The patient is well-developed and well-nourished and in no acute distress  Neck: The neck is supple, no carotid bruits are noted.  The neck is nontender.  Skin: Extremities are without significant edema.  Musculoskeletal:  Back is mildly tender  Neurologic Exam  Mental status: The patient is alert and oriented x 3 at the time of the examination. The patient has apparent normal recent and remote memory, with an apparently normal attention span and concentration ability.    Speech is normal.  Cranial nerves: Extraocular movements are full.  There is reduced facial sensation to temperature and touch over left V1, V2 and V3 distribution.  She also reports present vibration over the right forehead and absent sensation over the left forehead (nonphysiologic)  .Facial strength is normal.  Trapezius and sternocleidomastoid strength is normal. No dysarthria is noted.  The tongue is midline, and the patient has symmetric elevation of the soft palate. No obvious hearing deficits are noted.  Motor:  Muscle bulk is normal.   Tone is normal. Strength is  5 / 5 in all 4 extremities.   Sensory: Sensory testing she reports that a cold tuning fork feels painful on the left arm.   Vibration feels stronger over right hand.  She reports reduced sensation to touch and temperature in left arm and leg.  Vibratory sensation was reduced on the left side  Coordination: Cerebellar testing reveals good finger-nose-finger and heel-to-shin bilaterally.  Gait and station: Station is normal.   Gait is normal. Tandem gait is mildly wide. Romberg is negative.   Reflexes: Deep tendon reflexes are symmetric and normal bilaterally.   Plantar responses are flexor.    DIAGNOSTIC DATA (LABS, IMAGING, TESTING) - I reviewed patient records, labs, notes, testing and imaging myself where available.  Lab Results  Component Value Date   WBC 9.2 11/07/2014   HGB 11.8* 11/07/2014   HCT 36.5 11/07/2014   MCV 86.7 11/07/2014   PLT 417* 11/07/2014      Component Value Date/Time   NA 138 11/07/2014 2110   K 3.3* 11/07/2014 2110   CL 103 11/07/2014 2110   CO2 28 11/07/2014 2110   GLUCOSE 97 11/07/2014 2110   BUN 16 11/07/2014 2110   CREATININE 1.18* 11/07/2014 2110   CALCIUM 9.0 11/07/2014 2110   PROT 7.2 10/31/2013 2045   ALBUMIN 3.5 10/31/2013 2045   AST 11 10/31/2013 2045   ALT 8 10/31/2013 2045   ALKPHOS 84 10/31/2013  2045   BILITOT 0.4 10/31/2013 2045   GFRNONAA 53* 11/07/2014 2110    GFRAA >60 11/07/2014 2110       ASSESSMENT AND PLAN   Dysesthesia  Left arm pain  Neck pain  Midline low back pain with sciatica, sciatica laterality unspecified    1.  Etiology of her subjective sensory symptoms is unclear.     Strength, gait and DTRs are normal.   2.  Cyclobenzaprine 10 mg po tid (just take at night if it makes her sleepy. 3.  If not better, we will check NCV/EMG of the legs 4.   Try to get EMG/NCV of the arms done at [facility] 5.    Encouraged to exercise and to lose weight. She will return to see me in 4 months or sooner if she has new or worsening neurologic symptoms.   Richard A. Felecia Shelling, MD, PhD 10/19/4101, 01:31 AM Certified in Neurology, Clinical Neurophysiology, Sleep Medicine, Pain Medicine and Neuroimaging  [facility] [address], [place], [place] [address] [phone]

## 2015-03-03 IMAGING — DX DG LUMBAR SPINE COMPLETE 4+V
5 series · 5 of 5 positions shown · non-contrast
Comparison: None.

CLINICAL DATA: Left leg pain.

EXAM:
LUMBAR SPINE - COMPLETE 4+ VIEW

[l-spine ap]
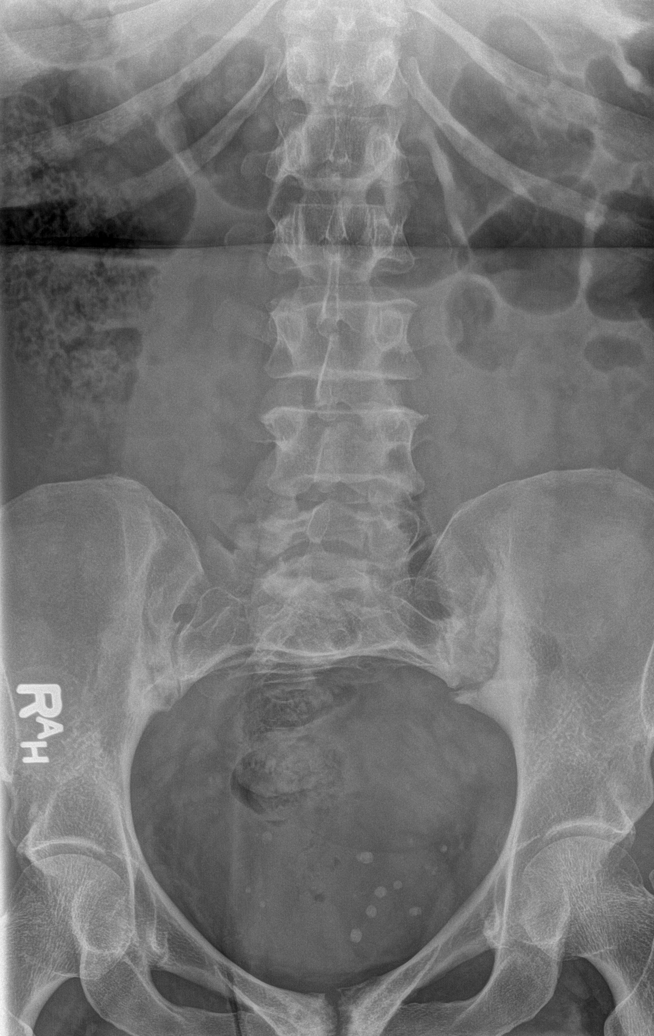

[l-spine obl (1 of 2)]
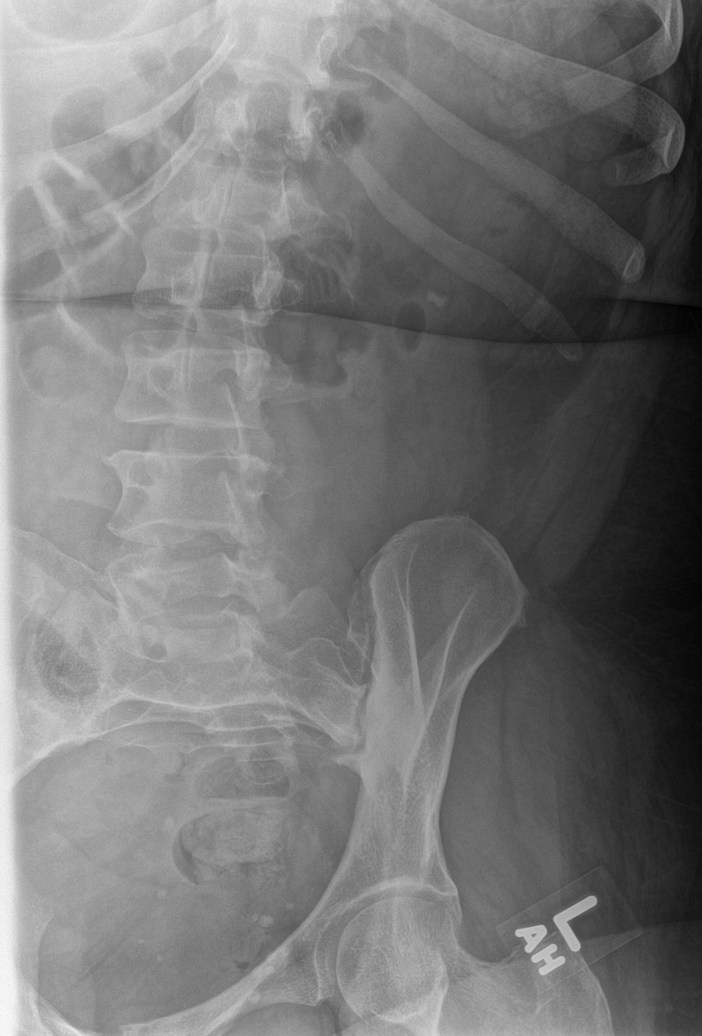

[l-spine obl (2 of 2)]
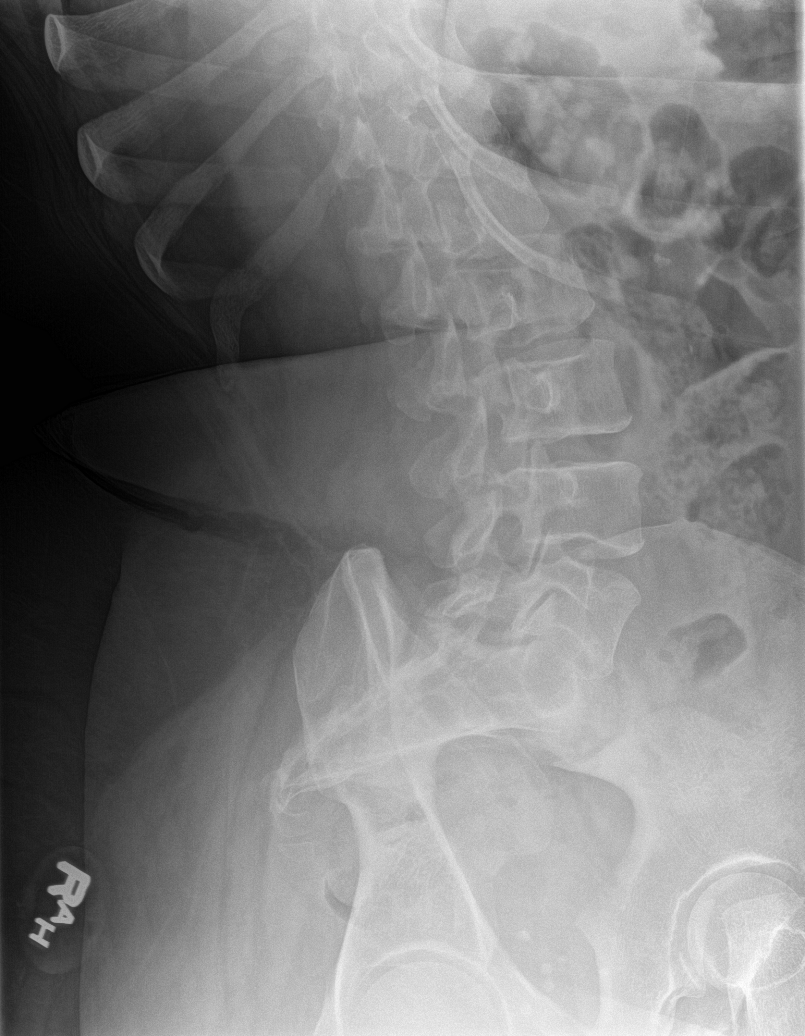

[l-spine lat]
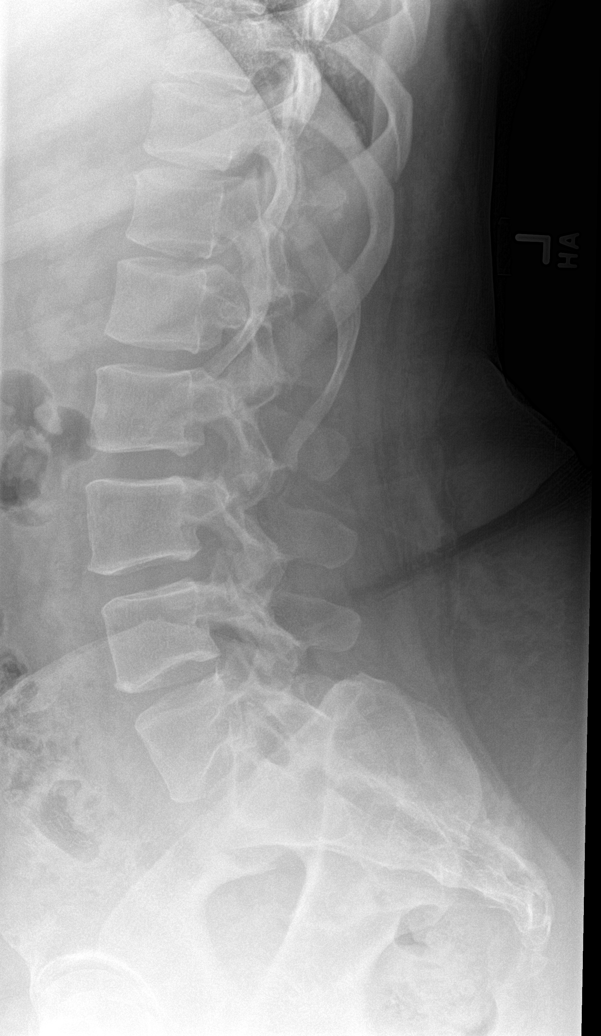

[l-spine spot]
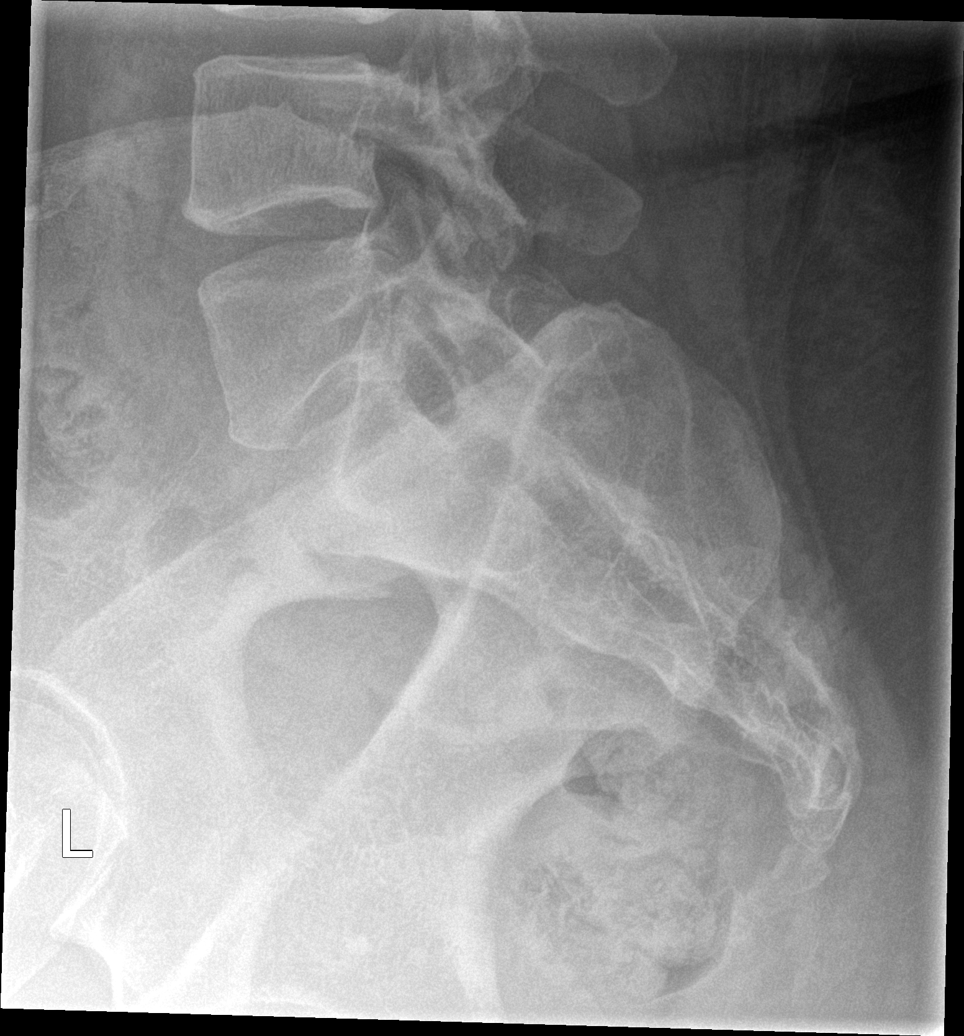

[5 of 5 positions shown; findings below may reference images not displayed]

FINDINGS: There is no evidence of lumbar spine fracture. Alignment is normal.
Intervertebral disc spaces are maintained.
IMPRESSION: Negative.

## 2015-03-03 IMAGING — DX DG HIP (WITH OR WITHOUT PELVIS) 2-3V*L*
3 series · 3 of 3 positions shown · non-contrast
Comparison: None.

CLINICAL DATA: Left hip pain

EXAM:
LEFT HIP (WITH PELVIS) 2-3 VIEWS

[pelvis ap]
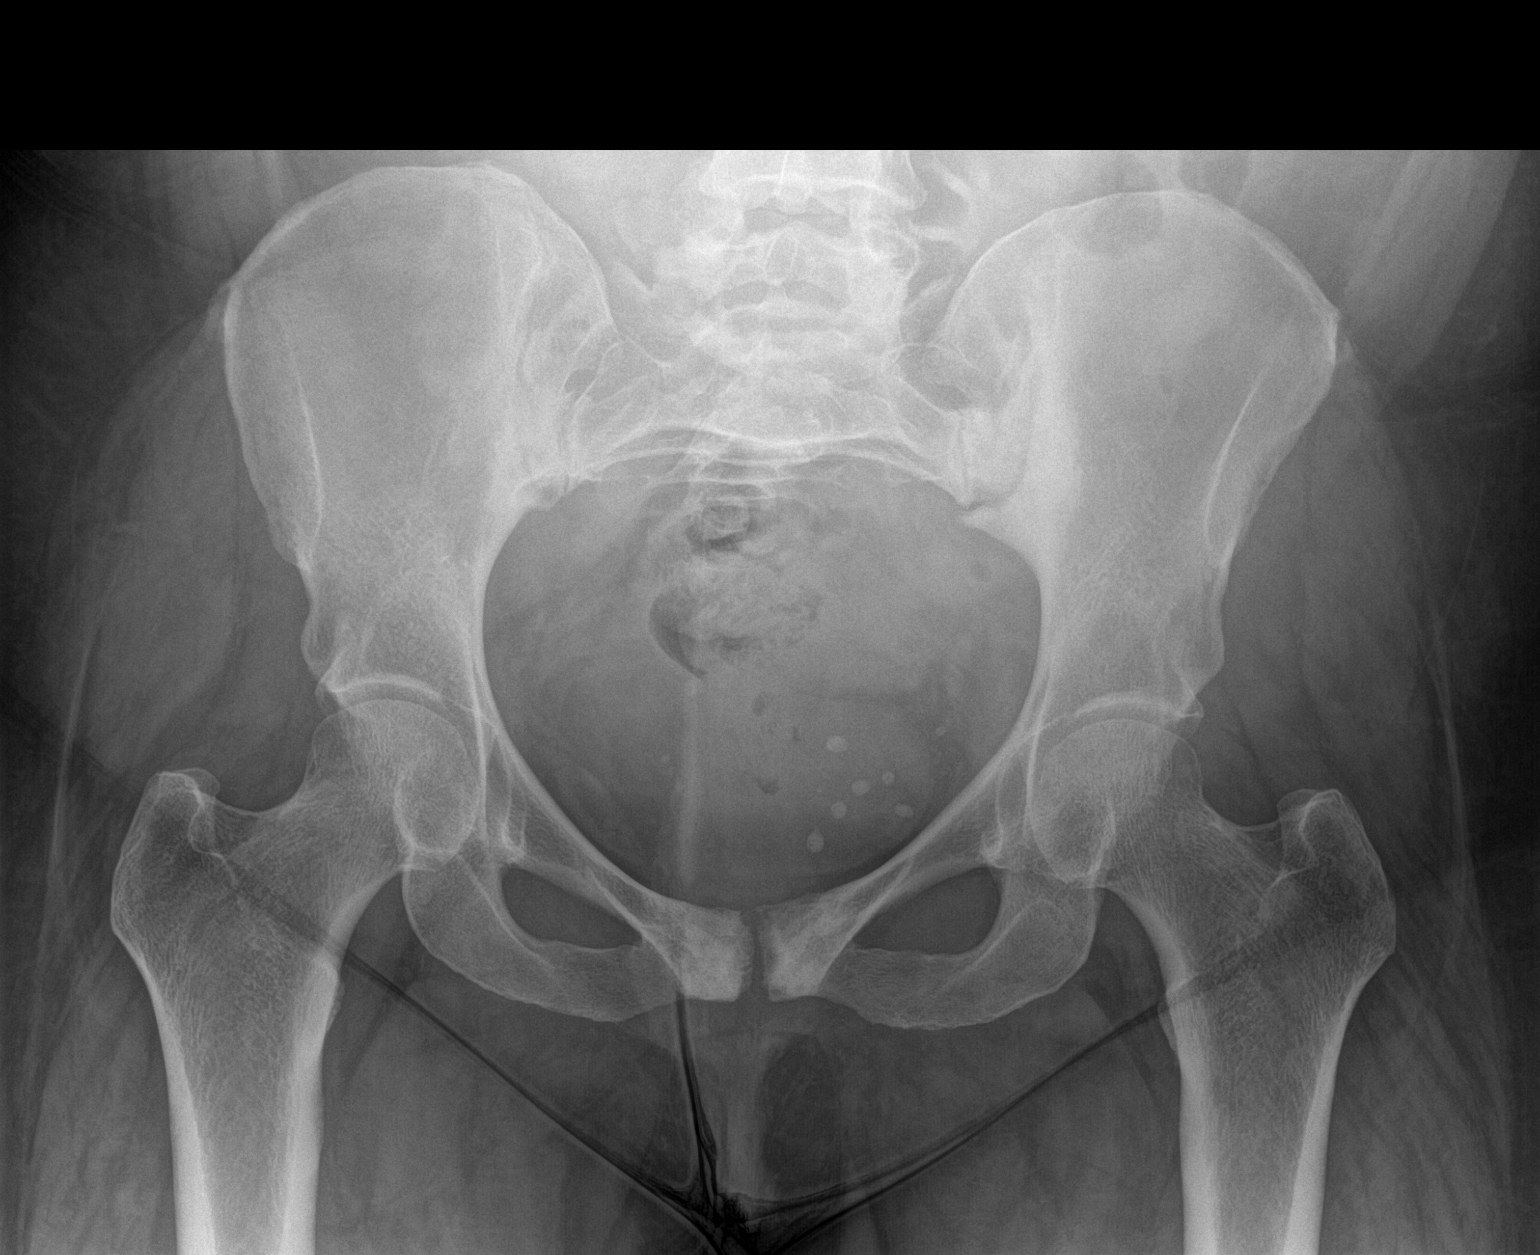

[hip ap]
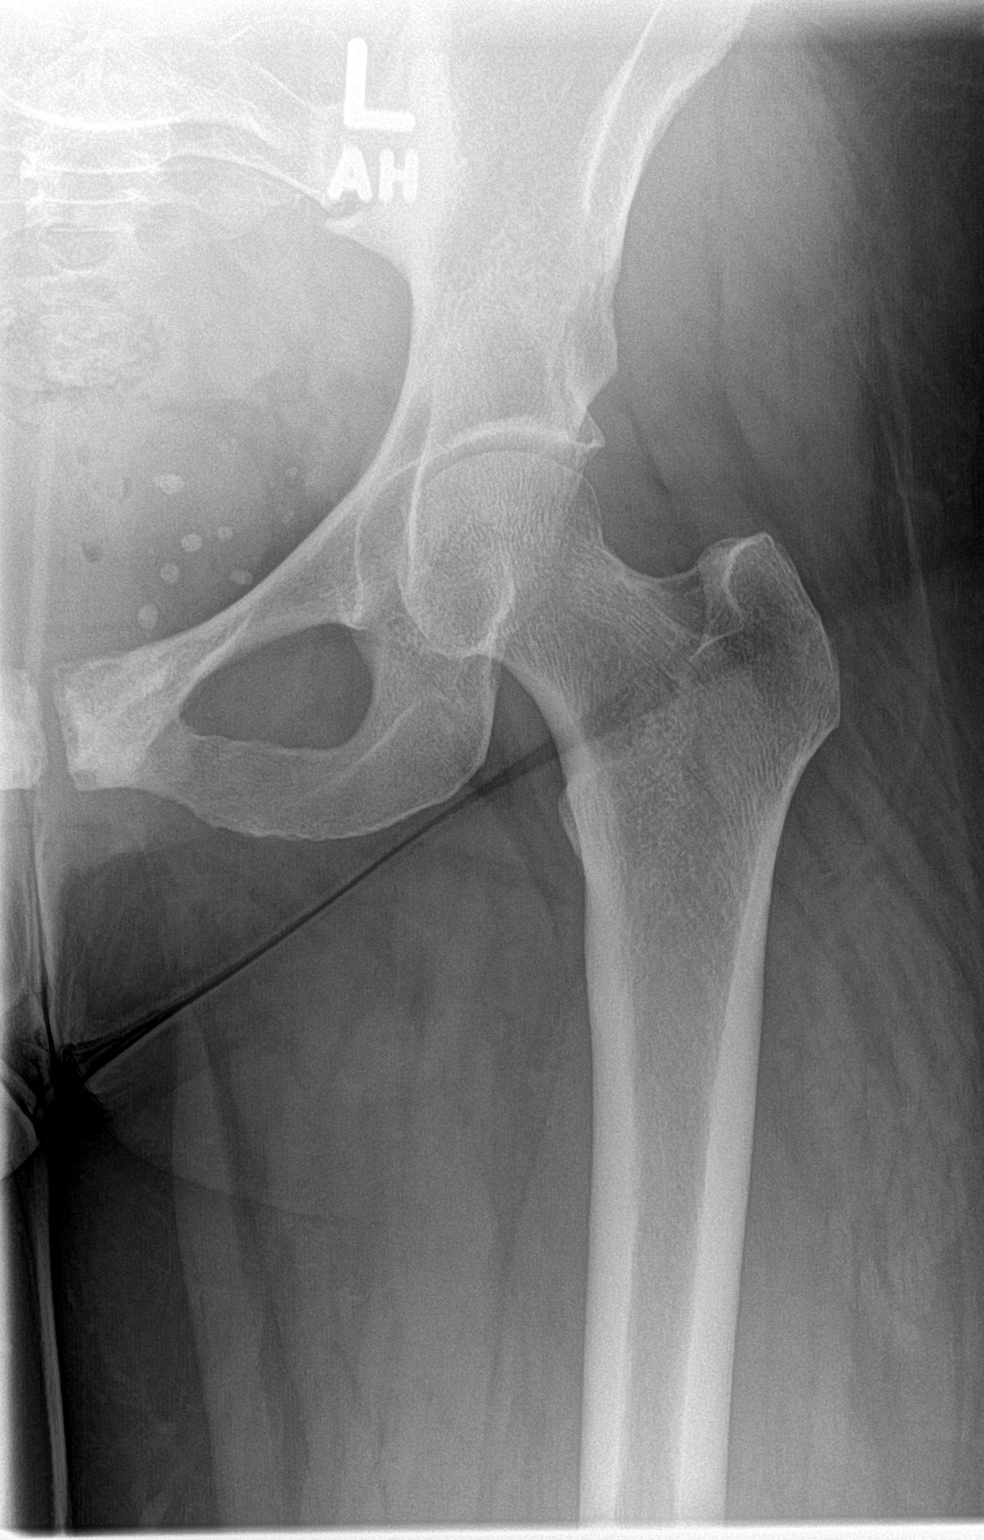

[hip lat]
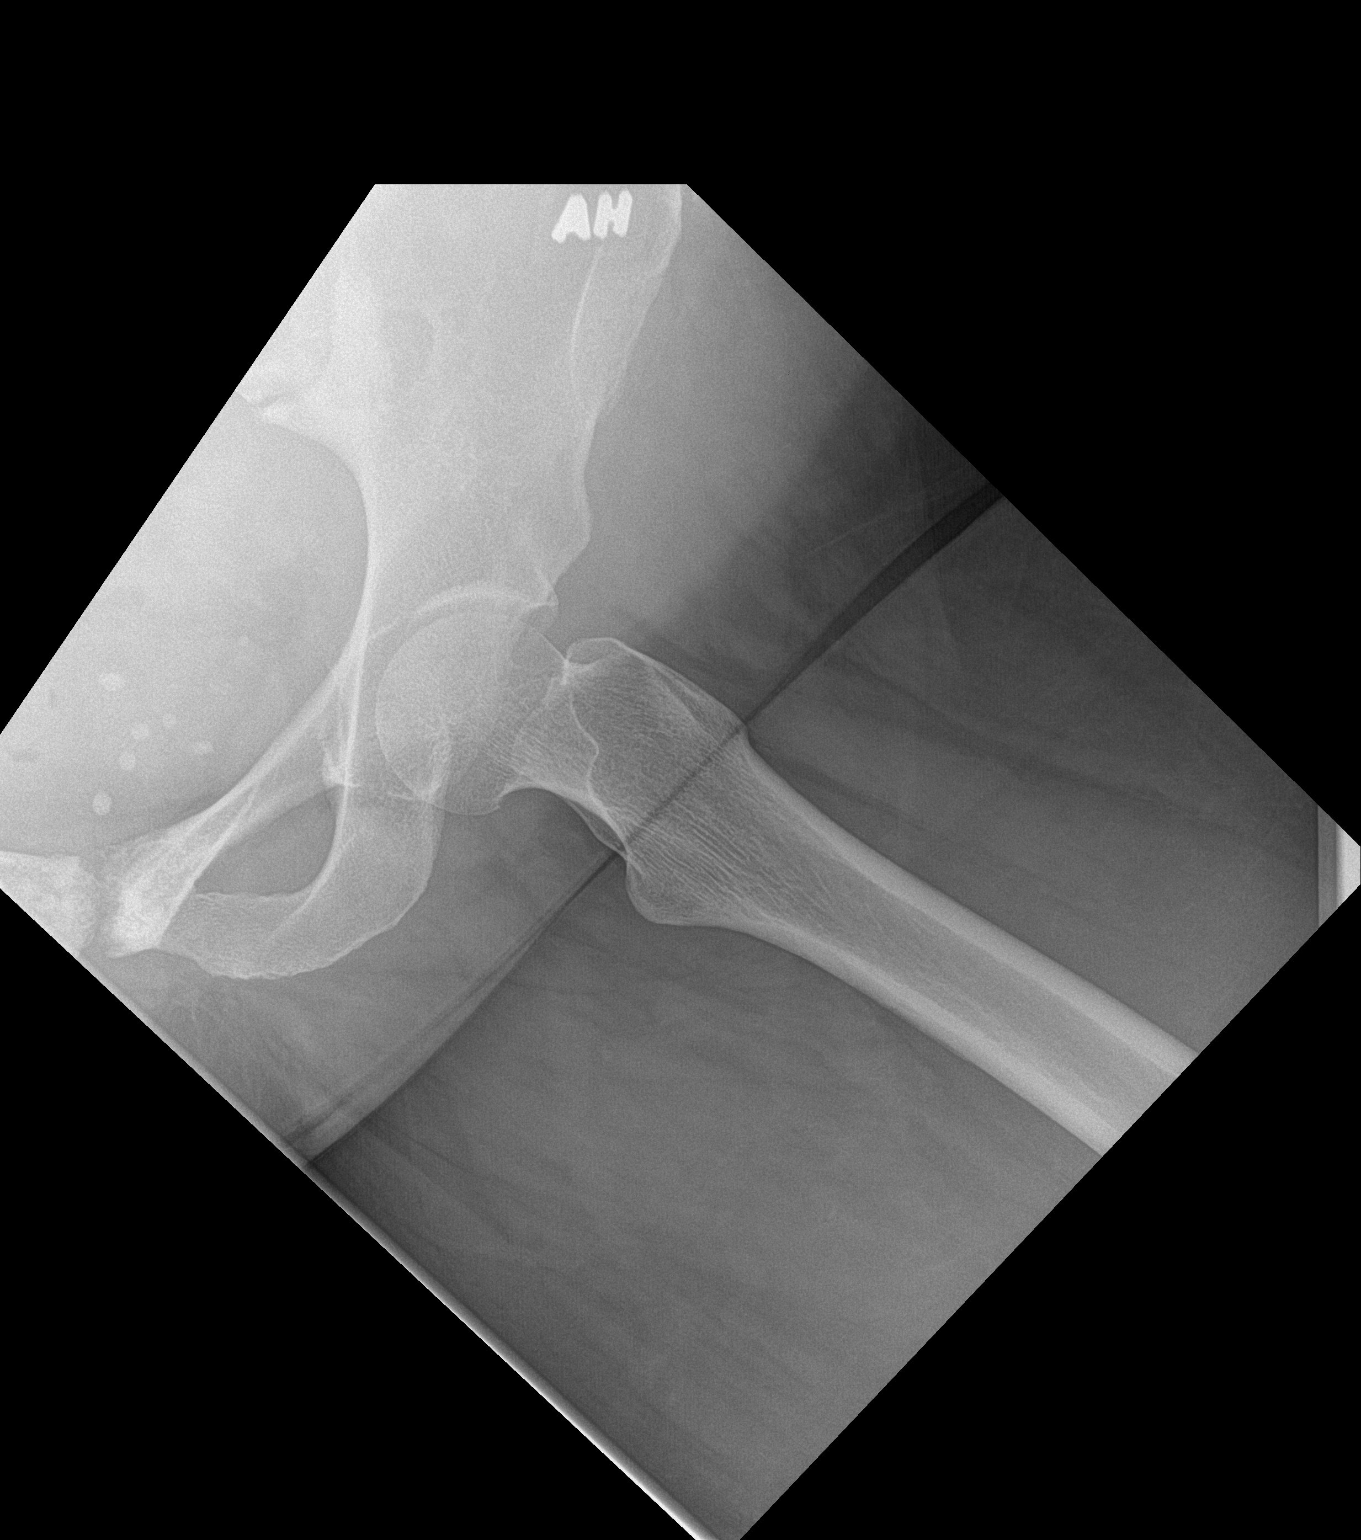

[3 of 3 positions shown; findings below may reference images not displayed]

FINDINGS: Hips are located. No evidence of left hip fracture or dislocation.
There is sclerosis of the pubic symphysis.
IMPRESSION: No fracture dislocation of left hip.  No acute osseous abnormality.

## 2015-05-15 ENCOUNTER — Ambulatory Visit: Payer: 59 | Admitting: Neurology

## 2015-05-24 ENCOUNTER — Encounter: Payer: Self-pay | Admitting: Neurology

## 2015-09-13 ENCOUNTER — Emergency Department (HOSPITAL_BASED_OUTPATIENT_CLINIC_OR_DEPARTMENT_OTHER): Payer: BLUE CROSS/BLUE SHIELD

## 2015-09-13 ENCOUNTER — Emergency Department (HOSPITAL_BASED_OUTPATIENT_CLINIC_OR_DEPARTMENT_OTHER)
Admission: EM | Admit: 2015-09-13 | Discharge: 2015-09-13 | Disposition: A | Discharge status: Home / Self Care | Payer: BLUE CROSS/BLUE SHIELD | Attending: Emergency Medicine | Admitting: Emergency Medicine

## 2015-09-13 ENCOUNTER — Encounter (HOSPITAL_BASED_OUTPATIENT_CLINIC_OR_DEPARTMENT_OTHER): Payer: Self-pay

## 2015-09-13 DIAGNOSIS — Z79899 Other long term (current) drug therapy: Secondary | ICD-10-CM | POA: Diagnosis not present

## 2015-09-13 DIAGNOSIS — X58XXXA Exposure to other specified factors, initial encounter: Secondary | ICD-10-CM | POA: Insufficient documentation

## 2015-09-13 DIAGNOSIS — Y929 Unspecified place or not applicable: Secondary | ICD-10-CM | POA: Insufficient documentation

## 2015-09-13 DIAGNOSIS — Y939 Activity, unspecified: Secondary | ICD-10-CM | POA: Diagnosis not present

## 2015-09-13 DIAGNOSIS — Y999 Unspecified external cause status: Secondary | ICD-10-CM | POA: Diagnosis not present

## 2015-09-13 DIAGNOSIS — I10 Essential (primary) hypertension: Secondary | ICD-10-CM | POA: Diagnosis not present

## 2015-09-13 DIAGNOSIS — S4992XA Unspecified injury of left shoulder and upper arm, initial encounter: Secondary | ICD-10-CM | POA: Insufficient documentation

## 2015-09-13 LAB — CBC WITH DIFFERENTIAL/PLATELET
BASOS PCT: 1 %
Basophils Absolute: 0 10*3/uL (ref 0.0–0.1)
EOS ABS: 0.4 10*3/uL (ref 0.0–0.7)
Eosinophils Relative: 5 %
HEMATOCRIT: 33.3 % — AB (ref 36.0–46.0)
HEMOGLOBIN: 10.8 g/dL — AB (ref 12.0–15.0)
LYMPHS ABS: 1.7 10*3/uL (ref 0.7–4.0)
Lymphocytes Relative: 19 %
MCH: 27.8 pg (ref 26.0–34.0)
MCHC: 32.4 g/dL (ref 30.0–36.0)
MCV: 85.6 fL (ref 78.0–100.0)
MONO ABS: 0.8 10*3/uL (ref 0.1–1.0)
MONOS PCT: 9 %
NEUTROS PCT: 66 %
Neutro Abs: 5.8 10*3/uL (ref 1.7–7.7)
Platelets: 422 10*3/uL — ABNORMAL HIGH (ref 150–400)
RBC: 3.89 MIL/uL (ref 3.87–5.11)
RDW: 14 % (ref 11.5–15.5)
WBC: 8.7 10*3/uL (ref 4.0–10.5)

## 2015-09-13 LAB — TROPONIN I: Troponin I: <0.03 ng/mL (ref <0.031)

## 2015-09-13 LAB — BASIC METABOLIC PANEL
Anion gap: 7 (ref 5–15)
BUN: 19 mg/dL (ref 6–20)
CALCIUM: 9.2 mg/dL (ref 8.9–10.3)
CHLORIDE: 103 mmol/L (ref 101–111)
CO2: 26 mmol/L (ref 22–32)
CREATININE: 1.39 mg/dL — AB (ref 0.44–1.00)
GFR calc non Af Amer: 43 mL/min — ABNORMAL LOW (ref >60)
GFR, EST AFRICAN AMERICAN: 50 mL/min — AB (ref >60)
Glucose, Bld: 95 mg/dL (ref 65–99)
Potassium: 3.6 mmol/L (ref 3.5–5.1)
SODIUM: 136 mmol/L (ref 135–145)

## 2015-09-13 MED ORDER — HYDROCODONE-ACETAMINOPHEN 5-325 MG PO TABS
1.0000 | ORAL_TABLET | Freq: Once | ORAL | Status: AC
  Administered 2015-09-13: 1 via ORAL
  Filled 2015-09-13: qty 1

## 2015-09-13 MED ORDER — HYDROCODONE-ACETAMINOPHEN 5-325 MG PO TABS: 1.0000 | ORAL_TABLET | Freq: Four times a day (QID) | ORAL | Status: DC | PRN

## 2015-09-13 NOTE — Discharge Instructions (Signed)
Make an appointment to follow-up with sports medicine upstairs and also referral to orthopedics given if needed. Wear the sling for the next week as much as possible. Take pain medicine as needed. Today's x-ray and CT scan of the shoulder without any acute findings. Work note provided.

## 2015-09-13 NOTE — ED Notes (Addendum)
C/o numbness,tingling to left UE x 40 min-denies injury-denies pain-denies neck pain-NAD-steady gait

## 2015-09-13 NOTE — ED Provider Notes (Signed)
CSN: [id number]     Arrival date & time 09/13/15  1629 History   First MD Initiated Contact with Patient 09/13/15 1653     Chief Complaint  Patient presents with  . Numbness     (Consider location/radiation/quality/duration/timing/severity/associated sxs/prior Treatment) The history is provided by the patient.  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 Pain was 9 out of 10. Started when she was trying to pick something up and got sudden pain in the arm and had to drop the object. No anterior posterior chest pain. No neck pain. No shortness of breath no nausea or vomiting. No abdominal pain. No back pain.  Past Medical History  Diagnosis Date  . Hypertension   . Leg pain   . Nerves   . Liver disease   . Vision abnormalities    Past Surgical History  Procedure Laterality Date  . Breast surgery    . Knee surgery    . Incontinence surgery     Family History  Problem Relation Age of Onset  . Hypertension Mother   . Diabetes Father   . Heart attack Neg Hx   . Sudden death Neg Hx   . Hyperlipidemia Other    Social History  Substance Use Topics  . Smoking status: Never Smoker   . Smokeless tobacco: None  . Alcohol Use: No   OB History    No data available     Review of Systems  Constitutional: Negative for fever.  HENT: Negative for congestion.   Eyes: Negative for redness.  Respiratory: Negative for shortness of breath.   Cardiovascular: Negative for chest pain.  Gastrointestinal: Negative for abdominal pain.  Genitourinary: Negative for dysuria.  Musculoskeletal: Negative for back pain, joint swelling and neck pain.  Skin: Negative for rash.  Neurological: Negative for headaches.  Hematological: Does not bruise/bleed easily.  Psychiatric/Behavioral: Negative for confusion.      Allergies  Review of patient's allergies indicates no known allergies.  Home Medications   Prior to Admission  medications   Medication Sig Start Date End Date Taking? Authorizing Provider  ascorbic acid (VITAMIN C) 500 MG tablet Take 500 mg by mouth daily.    Historical Provider, MD  atorvastatin (LIPITOR) 20 MG tablet  08/23/14   Historical Provider, MD  Biotin 10 MG CAPS Take 1 tablet by mouth daily.    Historical Provider, MD  cyclobenzaprine (FLEXERIL) 10 MG tablet Take 1 tablet (10 mg total) by mouth 3 (three) times daily as needed for muscle spasms. 01/12/15   Britt Bottom, MD  diclofenac (VOLTAREN) 75 MG EC tablet Take 1 tablet (75 mg total) by mouth 2 (two) times daily as needed for moderate pain. 05/31/14   Domenic Moras, PA-C  gabapentin (NEURONTIN) 300 MG capsule One in am, one in evening and two at bedtime po 10/05/14   Britt Bottom, MD  HYDROcodone-acetaminophen (NORCO/VICODIN) 5-325 MG per tablet Take 1 tablet by mouth every 6 (six) hours as needed for moderate pain or severe pain. 05/31/14   Domenic Moras, PA-C  HYDROcodone-acetaminophen (NORCO/VICODIN) 5-325 MG tablet Take 1-2 tablets by mouth every 6 (six) hours as needed for moderate pain. 09/13/15   Fredia Sorrow, MD  lisinopril-hydrochlorothiazide (PRINZIDE,ZESTORETIC) 20-25 MG per tablet Take 1 tablet by mouth daily.  07/09/12   Historical Provider, MD  methylPREDNISolone (MEDROL DOSEPAK) 4 MG TBPK tablet Take 6 tablets on day one, then decrease by one tablet each  day until gone. Patient not taking: Reported on 01/12/2015 10/13/14   Britt Bottom, MD  Multiple Vitamins-Minerals (ALIVE WOMENS 50+ PO) Take 1 tablet by mouth 2 (two) times daily.    Historical Provider, MD  oxybutynin (DITROPAN XL) 10 MG 24 hr tablet Take 10 mg by mouth. 01/04/15 01/04/16  Historical Provider, MD   BP 96/72 mmHg  Pulse 92  Temp(Src) 98.7 F (37.1 C) (Oral)  Resp 16  Ht 5\' 6"  (1.676 m)  Wt 125.646 kg  BMI 44.73 kg/m2  SpO2 100% Physical Exam  Constitutional: She is oriented to person, place, and time. She appears well-developed and well-nourished. No  distress.  HENT:  Head: Normocephalic and atraumatic.  Mouth/Throat: Oropharynx is clear and moist.  Eyes: Conjunctivae and EOM are normal. Pupils are equal, round, and reactive to light.  Neck: Normal range of motion. Neck supple.  Cardiovascular: Normal rate and regular rhythm.   No murmur heard. Pulmonary/Chest: Effort normal and breath sounds normal. No respiratory distress.  Abdominal: Soft. Bowel sounds are normal. There is no tenderness.  Musculoskeletal: She exhibits no edema.  Left arm radial pulse 2+. Refill to fingers 1-2 seconds. Good range of motion of fingers and wrist. Good range of motion at elbow no pain associated with any movement of the elbow wrist or fingers. Movement of the shoulder causes increased pain. No obvious deformity. No neck tenderness.  Neurological: She is alert and oriented to person, place, and time. No cranial nerve deficit. She exhibits normal muscle tone. Coordination normal.  Skin: Skin is warm. No rash noted.  Nursing note and vitals reviewed.   ED Course  Procedures (including critical care time) Labs Review Labs Reviewed  CBC WITH DIFFERENTIAL/PLATELET - Abnormal; Notable for the following:    Hemoglobin 10.8 (*)    HCT 33.3 (*)    Platelets 422 (*)    All other components within normal limits  BASIC METABOLIC PANEL - Abnormal; Notable for the following:    Creatinine, Ser 1.39 (*)    GFR calc non Af Amer 43 (*)    GFR calc Af Amer 50 (*)    All other components within normal limits  TROPONIN I   Results for orders placed or performed during the hospital encounter of 09/13/15  Troponin I  Result Value Ref Range   Troponin I <0.03 <0.031 ng/mL  CBC with Differential/Platelet  Result Value Ref Range   WBC 8.7 4.0 - 10.5 K/uL   RBC 3.89 3.87 - 5.11 MIL/uL   Hemoglobin 10.8 (L) 12.0 - 15.0 g/dL   HCT 33.3 (L) 36.0 - 46.0 %   MCV 85.6 78.0 - 100.0 fL   MCH 27.8 26.0 - 34.0 pg   MCHC 32.4 30.0 - 36.0 g/dL   RDW 14.0 11.5 - 15.5 %    Platelets 422 (H) 150 - 400 K/uL   Neutrophils Relative % 66 %   Neutro Abs 5.8 1.7 - 7.7 K/uL   Lymphocytes Relative 19 %   Lymphs Abs 1.7 0.7 - 4.0 K/uL   Monocytes Relative 9 %   Monocytes Absolute 0.8 0.1 - 1.0 K/uL   Eosinophils Relative 5 %   Eosinophils Absolute 0.4 0.0 - 0.7 K/uL   Basophils Relative 1 %   Basophils Absolute 0.0 0.0 - 0.1 K/uL  Basic metabolic panel  Result Value Ref Range   Sodium 136 135 - 145 mmol/L   Potassium 3.6 3.5 - 5.1 mmol/L   Chloride 103 101 - 111 mmol/L  CO2 26 22 - 32 mmol/L   Glucose, Bld 95 65 - 99 mg/dL   BUN 19 6 - 20 mg/dL   Creatinine, Ser 1.39 (H) 0.44 - 1.00 mg/dL   Calcium 9.2 8.9 - 10.3 mg/dL   GFR calc non Af Amer 43 (L) >60 mL/min   GFR calc Af Amer 50 (L) >60 mL/min   Anion gap 7 5 - 15     Imaging Review Dg Chest 2 View  09/13/2015  CLINICAL DATA:  Left shoulder pain and arm weakness. EXAM: CHEST  2 VIEW COMPARISON:  11/06/2014. FINDINGS: The heart size and mediastinal contours are stable. There are stable low lung volumes with mild central airway thickening and basilar atelectasis. No edema, confluent airspace opacity or pleural effusion. The bones appear unremarkable. IMPRESSION: Stable chest.  No acute cardiopulmonary process. Electronically Signed   By: Richardean Sale M.D.   On: 09/13/2015 18:07   Ct Shoulder Left Wo Contrast  09/13/2015  CLINICAL DATA:  Patient states left shoulder/ arm stopped working. No injury. EXAM: CT OF THE LEFT SHOULDER WITHOUT CONTRAST TECHNIQUE: Multidetector CT imaging was performed according to the standard protocol. Multiplanar CT image reconstructions were also generated. COMPARISON:  Plain films 09/13/2015 FINDINGS: Subtle early degenerative change of the [facility] joint and glenohumeral joints. No evidence of fracture or dislocation. No significant joint effusion. Mild degenerative changes of the spine. IMPRESSION: No acute findings. Electronically Signed   By: Marin Olp M.D.   On: 09/13/2015  19:02   Dg Shoulder Left  09/13/2015  CLINICAL DATA:  Shoulder pain EXAM: LEFT SHOULDER - 2+ VIEW COMPARISON:  None. FINDINGS: On the AP view, the left humeral head appears well positioned relative to the glenoid fossa. Axillary view and scapular Y-view are slightly suboptimal in positioning, but humeral head appears grossly well-positioned on these views as well. No fracture line or displaced fracture fragment identified. Adjacent soft tissues are unremarkable. IMPRESSION: No evidence of fracture or dislocation, with mild study limitations detailed above. Electronically Signed   By: Franki Cabot M.D.   On: 09/13/2015 18:24   I have personally reviewed and evaluated these images and lab results as part of my medical decision-making.   EKG Interpretation   Date/Time:  Thursday Sep 13 2015 16:40:45 EDT Ventricular Rate:  109 PR Interval:  141 QRS Duration: 94 QT Interval:  351 QTC Calculation: 473 R Axis:   -48 Text Interpretation:  Sinus tachycardia LAD, consider left anterior  fascicular block Low voltage, precordial leads Consider anterior infarct  Baseline wander in lead(s) II aVF V3 No significant change since last  tracing Confirmed by Hayley Horn  MD, Recie Cirrincione [phone]) on 09/13/2015 5:07:03 PM      MDM   Final diagnoses:  Shoulder injury, left, initial encounter    Patient while at work out's cleaning was sudden onset of pain in left shoulder causing her to drop which she had picked up. Associated with tingling and numbness mostly in the upper arm area. That has persisted. No direct injury. Patient without any chest pain or pain to the back or neck. No nausea no vomiting.  Patient stated the pain was 9 out of 10.  X-ray raised some concerns for possible dislocation based on radiology's recommendation CT of the shoulder was done for further clarification and it was negative. Patient will be treated symptomatically with a left arm sling follow-up with sports medicine and orthopedics  and hydrocodone as needed for pain. As well as a work note not to use  the left arm for 2 weeks.  Patient's workup for possible atypical chest pain was negative EKG without acute findings. Troponin was negative basic labs without any significant abnormalities.    Fredia Sorrow, MD 09/13/15 [phone]

## 2019-03-26 ENCOUNTER — Encounter (HOSPITAL_BASED_OUTPATIENT_CLINIC_OR_DEPARTMENT_OTHER): Payer: Self-pay | Admitting: Emergency Medicine

## 2019-03-26 ENCOUNTER — Emergency Department (HOSPITAL_BASED_OUTPATIENT_CLINIC_OR_DEPARTMENT_OTHER)
Admission: EM | Admit: 2019-03-26 | Discharge: 2019-03-26 | Disposition: A | Discharge status: Home / Self Care | Payer: BLUE CROSS/BLUE SHIELD | Attending: Emergency Medicine | Admitting: Emergency Medicine

## 2019-03-26 ENCOUNTER — Other Ambulatory Visit: Payer: Self-pay

## 2019-03-26 DIAGNOSIS — Z20828 Contact with and (suspected) exposure to other viral communicable diseases: Secondary | ICD-10-CM | POA: Insufficient documentation

## 2019-03-26 DIAGNOSIS — Z20822 Contact with and (suspected) exposure to covid-19: Secondary | ICD-10-CM

## 2019-03-26 DIAGNOSIS — Z79899 Other long term (current) drug therapy: Secondary | ICD-10-CM | POA: Insufficient documentation

## 2019-03-26 DIAGNOSIS — R05 Cough: Secondary | ICD-10-CM | POA: Insufficient documentation

## 2019-03-26 DIAGNOSIS — I10 Essential (primary) hypertension: Secondary | ICD-10-CM | POA: Insufficient documentation

## 2019-03-26 MED ORDER — BENZONATATE 100 MG PO CAPS: 100.0000 mg | ORAL_CAPSULE | Freq: Three times a day (TID) | ORAL | 0 refills | Status: DC

## 2019-03-26 NOTE — ED Triage Notes (Signed)
Pt reports chest congestion with cough x 3 days. Pt denies covid exposure. Denies sob, c/o right ear pain.

## 2019-03-26 NOTE — ED Provider Notes (Signed)
[facility] EMERGENCY DEPARTMENT Provider Note   CSN: [id number] Arrival date & time: 03/26/19  1249     History   Chief Complaint Chief Complaint  Patient presents with  . Cough    HPI Kristen Woods is a 53 y.o. female.     The history is provided by the patient. No language interpreter was used.  Cough    53 year old female with history of hypertension, presenting with cold symptoms.  For the past 3 days patient endorsed productive cough with white sputum, right ear pain, some mild fatigue.  Symptom is moderate in severity, increasing cough towards nighttime.  No associated fever, no headache, no shortness of breath, chest pain, abdominal pain, nausea vomiting diarrhea, loss of taste or smell or rash.  Does have history of asthma but denies increased wheezing.  She is a non-smoker.  She denies any recent sick contact with anyone with COVID-19.  She has been taking Mucinex at home with some relief.  No recent travel.  Past Medical History:  Diagnosis Date  . Hypertension   . Leg pain   . Liver disease   . Nerves   . Vision abnormalities     Patient Active Problem List   Diagnosis Date Noted  . Lower back pain 01/12/2015  . Neck pain 10/05/2014  . Left arm pain 10/05/2014  . Dysesthesia 10/05/2014  . Shoulder pain, left 10/05/2014  . Left knee pain 08/02/2012  . Right wrist pain 02/09/2012  . Left leg pain 11/17/2011    Past Surgical History:  Procedure Laterality Date  . BREAST SURGERY    . INCONTINENCE SURGERY    . KNEE SURGERY       OB History   No obstetric history on file.      Home Medications    Prior to Admission medications   Medication Sig Start Date End Date Taking? Authorizing Provider  ascorbic acid (VITAMIN C) 500 MG tablet Take 500 mg by mouth daily.    [provider]  atorvastatin (LIPITOR) 20 MG tablet  08/23/14   [provider]  Biotin 10 MG CAPS Take 1 tablet by mouth daily.    [provider]   cyclobenzaprine (FLEXERIL) 10 MG tablet Take 1 tablet (10 mg total) by mouth 3 (three) times daily as needed for muscle spasms. 01/12/15   Sater, Nanine Means, MD  diclofenac (VOLTAREN) 75 MG EC tablet Take 1 tablet (75 mg total) by mouth 2 (two) times daily as needed for moderate pain. 05/31/14   Domenic Moras, PA-C  gabapentin (NEURONTIN) 300 MG capsule One in am, one in evening and two at bedtime po 10/05/14   Sater, Nanine Means, MD  HYDROcodone-acetaminophen (NORCO/VICODIN) 5-325 MG per tablet Take 1 tablet by mouth every 6 (six) hours as needed for moderate pain or severe pain. 05/31/14   Domenic Moras, PA-C  HYDROcodone-acetaminophen (NORCO/VICODIN) 5-325 MG tablet Take 1-2 tablets by mouth every 6 (six) hours as needed for moderate pain. 09/13/15   Fredia Sorrow, MD  lisinopril-hydrochlorothiazide (PRINZIDE,ZESTORETIC) 20-25 MG per tablet Take 1 tablet by mouth daily.  07/09/12   [provider]  methylPREDNISolone (MEDROL DOSEPAK) 4 MG TBPK tablet Take 6 tablets on day one, then decrease by one tablet each day until gone. Patient not taking: Reported on 01/12/2015 10/13/14   Sater, Nanine Means, MD  Multiple Vitamins-Minerals (ALIVE WOMENS 50+ PO) Take 1 tablet by mouth 2 (two) times daily.    [provider]    Family History Family  History  Problem Relation Age of Onset  . Hypertension Mother   . Diabetes Father   . Hyperlipidemia Other   . Heart attack Neg Hx   . Sudden death Neg Hx     Social History Social History   Tobacco Use  . Smoking status: Never Smoker  Substance Use Topics  . Alcohol use: No  . Drug use: No     Allergies   Patient has no known allergies.   Review of Systems Review of Systems  Respiratory: Positive for cough.   All other systems reviewed and are negative.    Physical Exam Updated Vital Signs BP (!) 139/91 (BP Location: Left Arm)   Pulse 84   Temp 98.7 F (37.1 C) (Oral)   Resp 18   Ht 5\' 6"  (1.676 m)   Wt 122.5 kg   SpO2 98%    BMI 43.58 kg/m   Physical Exam Vitals signs and nursing note reviewed.  Constitutional:      General: She is not in acute distress.    Appearance: She is well-developed. She is obese.  HENT:     Head: Atraumatic.     Right Ear: Tympanic membrane, ear canal and external ear normal. There is no impacted cerumen.     Left Ear: Tympanic membrane, ear canal and external ear normal. There is no impacted cerumen.     Nose: Nose normal.  Eyes:     Conjunctiva/sclera: Conjunctivae normal.  Neck:     Musculoskeletal: Neck supple.  Cardiovascular:     Rate and Rhythm: Normal rate and regular rhythm.     Pulses: Normal pulses.     Heart sounds: Normal heart sounds.  Pulmonary:     Effort: Pulmonary effort is normal.     Breath sounds: Normal breath sounds. No wheezing, rhonchi or rales.  Abdominal:     Palpations: Abdomen is soft.     Tenderness: There is no abdominal tenderness.  Musculoskeletal:        General: No swelling.  Skin:    Capillary Refill: Capillary refill takes less than 2 seconds.     Findings: No rash.  Neurological:     Mental Status: She is alert and oriented to person, place, and time.  Psychiatric:        Mood and Affect: Mood normal.      ED Treatments / Results  Labs (all labs ordered are listed, but only abnormal results are displayed) Labs Reviewed - No data to display  EKG None  Radiology No results found.  Procedures Procedures (including critical care time)  Medications Ordered in ED Medications - No data to display   Initial Impression / Assessment and Plan / ED Course  I have reviewed the triage vital signs and the nursing notes.  Pertinent labs & imaging results that were available during my care of the patient were reviewed by me and considered in my medical decision making (see chart for details).        BP (!) 139/91 (BP Location: Left Arm)   Pulse 84   Temp 98.7 F (37.1 C) (Oral)   Resp 18   Ht 5\' 6"  (1.676 m)   Wt  122.5 kg   SpO2 98%   BMI 43.58 kg/m    Final Clinical Impressions(s) / ED Diagnoses   Final diagnoses:  Suspected COVID-19 virus infection    ED Discharge Orders         Ordered    benzonatate (TESSALON) 100 MG capsule  Every 8 hours     03/26/19 1534        3:32 PM Patient here with cold symptoms.  She is well-appearing, afebrile, no hypoxia.  Lungs are clear on exam, doubt pneumonia.  Ear inspection unremarkable.  Will obtain Covid testing, provide symptomatic treatment and patient given COVID-19 guidelines per CDC.  Return precaution discussed.  Kristen Woods was evaluated in Emergency Department on 03/26/2019 for the symptoms described in the history of present illness. She was evaluated in the context of the global COVID-19 pandemic, which necessitated consideration that the patient might be at risk for infection with the SARS-CoV-2 virus that causes COVID-19. Institutional protocols and algorithms that pertain to the evaluation of patients at risk for COVID-19 are in a state of rapid change based on information released by regulatory bodies including the CDC and federal and state organizations. These policies and algorithms were followed during the patient's care in the ED.    Domenic Moras, PA-C 03/26/19 1536    Quintella Reichert, MD 03/26/19 2312

## 2019-03-26 NOTE — Discharge Instructions (Signed)
Take cough medication as needed for cough.  A COVID-19 test have been sent.  If positive you will be notify in the next 2-4 days. Return if you have any concerns.

## 2019-03-27 LAB — SARS CORONAVIRUS 2 (TAT 6-24 HRS): SARS Coronavirus 2: NEGATIVE

## 2019-06-13 DIAGNOSIS — R609 Edema, unspecified: Secondary | ICD-10-CM | POA: Insufficient documentation

## 2019-06-13 DIAGNOSIS — Z86010 Personal history of colonic polyps: Secondary | ICD-10-CM | POA: Insufficient documentation

## 2019-06-13 DIAGNOSIS — D649 Anemia, unspecified: Secondary | ICD-10-CM | POA: Insufficient documentation

## 2019-06-13 DIAGNOSIS — M5030 Other cervical disc degeneration, unspecified cervical region: Secondary | ICD-10-CM | POA: Insufficient documentation

## 2019-06-13 DIAGNOSIS — J4520 Mild intermittent asthma, uncomplicated: Secondary | ICD-10-CM | POA: Insufficient documentation

## 2019-06-13 DIAGNOSIS — M5137 Other intervertebral disc degeneration, lumbosacral region: Secondary | ICD-10-CM | POA: Insufficient documentation

## 2019-06-13 DIAGNOSIS — K219 Gastro-esophageal reflux disease without esophagitis: Secondary | ICD-10-CM | POA: Insufficient documentation

## 2019-09-01 DIAGNOSIS — E782 Mixed hyperlipidemia: Secondary | ICD-10-CM | POA: Insufficient documentation

## 2021-03-05 DIAGNOSIS — N393 Stress incontinence (female) (male): Secondary | ICD-10-CM | POA: Insufficient documentation

## 2021-03-05 DIAGNOSIS — G4733 Obstructive sleep apnea (adult) (pediatric): Secondary | ICD-10-CM | POA: Insufficient documentation

## 2021-07-01 DIAGNOSIS — M722 Plantar fascial fibromatosis: Secondary | ICD-10-CM | POA: Diagnosis not present

## 2021-08-06 ENCOUNTER — Other Ambulatory Visit: Payer: Self-pay

## 2021-08-06 ENCOUNTER — Encounter (HOSPITAL_BASED_OUTPATIENT_CLINIC_OR_DEPARTMENT_OTHER): Payer: Self-pay | Admitting: Emergency Medicine

## 2021-08-06 ENCOUNTER — Emergency Department (HOSPITAL_BASED_OUTPATIENT_CLINIC_OR_DEPARTMENT_OTHER): Payer: 59

## 2021-08-06 ENCOUNTER — Emergency Department (HOSPITAL_BASED_OUTPATIENT_CLINIC_OR_DEPARTMENT_OTHER)
Admission: EM | Admit: 2021-08-06 | Discharge: 2021-08-06 | Disposition: A | Discharge status: Home / Self Care | Payer: 59 | Attending: Emergency Medicine | Admitting: Emergency Medicine

## 2021-08-06 DIAGNOSIS — M722 Plantar fascial fibromatosis: Secondary | ICD-10-CM

## 2021-08-06 DIAGNOSIS — G8929 Other chronic pain: Secondary | ICD-10-CM | POA: Diagnosis not present

## 2021-08-06 DIAGNOSIS — J45909 Unspecified asthma, uncomplicated: Secondary | ICD-10-CM | POA: Diagnosis not present

## 2021-08-06 DIAGNOSIS — M25562 Pain in left knee: Secondary | ICD-10-CM | POA: Diagnosis not present

## 2021-08-06 DIAGNOSIS — Z79899 Other long term (current) drug therapy: Secondary | ICD-10-CM | POA: Insufficient documentation

## 2021-08-06 DIAGNOSIS — Z96652 Presence of left artificial knee joint: Secondary | ICD-10-CM | POA: Diagnosis not present

## 2021-08-06 DIAGNOSIS — I10 Essential (primary) hypertension: Secondary | ICD-10-CM | POA: Insufficient documentation

## 2021-08-06 DIAGNOSIS — M7989 Other specified soft tissue disorders: Secondary | ICD-10-CM | POA: Diagnosis not present

## 2021-08-06 MED ORDER — IBUPROFEN 200 MG PO TABS
600.0000 mg | ORAL_TABLET | Freq: Once | ORAL | Status: AC
  Administered 2021-08-06: 600 mg via ORAL
  Filled 2021-08-06: qty 1

## 2021-08-06 NOTE — ED Triage Notes (Addendum)
Pt reports tripping down 2 stairs approx 2 months ago. Pt reports left knee pain and left foot pain. Pt ambulatory to triage.  ?

## 2021-08-06 NOTE — ED Provider Notes (Signed)
?[facility] EMERGENCY DEPARTMENT ?Provider Note ? ? ?CSN: [id number] ?Arrival date & time: 08/06/21  1024 ? ?  ? ?History ? ?Chief Complaint  ?Patient presents with  ? Fall  ? ? ?Kristen Woods is a 56 y.o. female. With past medical history of hypertension, asthma, GERD, obesity who presents to the emergency department after fall.  ? ?States that about 2 months ago she fell going down 2 stairs onto her left knee.  She states that she was doing okay since then.  States that she started a job about 2 to 3 months ago which requires her to be on her feet for around 8 hours a day.  She states that she has had worsening left foot pain that she describes as under her heel and arch.  She states that she gets sharp shooting pains from the bottom of the foot up the leg which has started to aggravate her knee.  She states that this morning she was seated when her granddaughter jumped into her lap causing her to have an acute onset of left knee pain.  She does have a history of left knee replacement.  There is no complications from this.  She currently does not see orthopedics.  She denies any redness, swelling or fevers. ? ? ?Fall ? ? ?  ? ?Home Medications ?Prior to Admission medications   ?Medication Sig Start Date End Date Taking? Authorizing Provider  ?ascorbic acid (VITAMIN C) 500 MG tablet Take 500 mg by mouth daily.    [provider]  ?atorvastatin (LIPITOR) 20 MG tablet  08/23/14   [provider]  ?benzonatate (TESSALON) 100 MG capsule Take 1 capsule (100 mg total) by mouth every 8 (eight) hours. 03/26/19   Domenic Moras, PA-C  ?Biotin 10 MG CAPS Take 1 tablet by mouth daily.    [provider]  ?cyclobenzaprine (FLEXERIL) 10 MG tablet Take 1 tablet (10 mg total) by mouth 3 (three) times daily as needed for muscle spasms. 01/12/15   Sater, Nanine Means, MD  ?diclofenac (VOLTAREN) 75 MG EC tablet Take 1 tablet (75 mg total) by mouth 2 (two) times daily as needed for moderate pain. 05/31/14    Domenic Moras, PA-C  ?gabapentin (NEURONTIN) 300 MG capsule One in am, one in evening and two at bedtime po 10/05/14   Sater, Nanine Means, MD  ?HYDROcodone-acetaminophen (NORCO/VICODIN) 5-325 MG per tablet Take 1 tablet by mouth every 6 (six) hours as needed for moderate pain or severe pain. 05/31/14   Domenic Moras, PA-C  ?HYDROcodone-acetaminophen (NORCO/VICODIN) 5-325 MG tablet Take 1-2 tablets by mouth every 6 (six) hours as needed for moderate pain. 09/13/15   Fredia Sorrow, MD  ?lisinopril-hydrochlorothiazide (PRINZIDE,ZESTORETIC) 20-25 MG per tablet Take 1 tablet by mouth daily.  07/09/12   [provider]  ?methylPREDNISolone (MEDROL DOSEPAK) 4 MG TBPK tablet Take 6 tablets on day one, then decrease by one tablet each day until gone. ?Patient not taking: Reported on 01/12/2015 10/13/14   Britt Bottom, MD  ?Multiple Vitamins-Minerals (ALIVE WOMENS 50+ PO) Take 1 tablet by mouth 2 (two) times daily.    [provider]  ?   ? ?Allergies    ?Patient has no known allergies.   ? ?Review of Systems   ?Review of Systems  ?Musculoskeletal:  Positive for arthralgias and gait problem.  ?All other systems reviewed and are negative. ? ?Physical Exam ?Updated Vital Signs ?BP (!) 160/77 (BP Location: Left Arm)   Pulse 92   Temp 98.1 ?  F (36.7 ?C) (Oral)   Resp 20   SpO2 97%  ?Physical Exam ?Vitals and nursing note reviewed.  ?Constitutional:   ?   General: She is not in acute distress. ?   Appearance: Normal appearance. She is obese. She is not ill-appearing or toxic-appearing.  ?HENT:  ?   Head: Normocephalic and atraumatic.  ?Eyes:  ?   General: No scleral icterus. ?Cardiovascular:  ?   Rate and Rhythm: Normal rate and regular rhythm.  ?   Pulses: Normal pulses.  ?Pulmonary:  ?   Effort: Pulmonary effort is normal.  ?   Breath sounds: Normal breath sounds.  ?Abdominal:  ?   General: Bowel sounds are normal.  ?   Palpations: Abdomen is soft.  ?Musculoskeletal:     ?   General: Tenderness present. No  swelling, deformity or signs of injury. Normal range of motion.  ?   Right lower leg: No edema.  ?   Left lower leg: No edema.  ?   Comments: Left knee with old incision that is well-healed. ?Compartments soft, DP pulse 2+, full range of motion of the knee and ankle. ?Tenderness to palpation of the left plantar fascia.  ? ?No redness, swelling, warmth of the knee or ankle concerning for septic joint ?No effusions  ?Skin: ?   General: Skin is warm and dry.  ?   Capillary Refill: Capillary refill takes less than 2 seconds.  ?Neurological:  ?   General: No focal deficit present.  ?   Mental Status: She is alert and oriented to person, place, and time. Mental status is at baseline.  ?   Sensory: No sensory deficit.  ?Psychiatric:     ?   Mood and Affect: Mood normal.     ?   Behavior: Behavior normal.     ?   Thought Content: Thought content normal.     ?   Judgment: Judgment normal.  ? ? ?ED Results / Procedures / Treatments   ?Labs ?(all labs ordered are listed, but only abnormal results are displayed) ?Labs Reviewed - No data to display ? ?EKG ?None ? ?Radiology ?DG Knee Complete 4 Views Left ? ?Result Date: 08/06/2021 ?CLINICAL DATA:  Fall on concrete steps a few months ago, pain and swelling mid anterior knee overlying patella, worsening EXAM: LEFT KNEE - COMPLETE 4+ VIEW COMPARISON:  Knee radiographs 10/11/2011 FINDINGS: Postsurgical changes reflecting right knee arthroplasty are seen. Hardware alignment is within expected limits, without evidence of complication. There is no acute fracture or dislocation. Knee alignment is normal. There is mild degenerative change along the medial and lateral margins of the tibia. The soft tissues are unremarkable. There is no effusion. IMPRESSION: No acute fracture or dislocation. Status post right knee arthroplasty without evidence of complication. Electronically Signed   By: Valetta Mole M.D.   On: 08/06/2021 11:07  ? ?DG Foot Complete Left ? ?Result Date: 08/06/2021 ?CLINICAL  DATA:  [place] on concrete steps a few months ago. Pain and swelling. Calcaneal pain. Heel spurs. EXAM: LEFT FOOT - COMPLETE 3+ VIEW COMPARISON:  None. FINDINGS: Mild-to-moderate plantar calcaneal heel spur. Minimal degenerative spurring at the dorsal tarsometatarsal articulation on lateral view. Minimal degenerative spurring of the lateral aspect of the great toe metatarsal head. No acute fracture or dislocation. IMPRESSION: Mild-to-moderate plantar calcaneal heel spur. 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 Electronically Signed   By: Yvonne Kendall M.D.   On: 08/06/2021 11:10   ? ?Procedures ?Procedures  ? ?  Medications Ordered in ED ?Medications - No data to display ? ?ED Course/ Medical Decision Making/ A&P ?  ?                        ?Medical Decision Making ?Amount and/or Complexity of Data Reviewed ?Radiology: ordered. ? ?This patient presents to the ED for concern of fall, this involves an extensive number of treatment options, and is a complaint that carries with it a high risk of complications and morbidity.  The differential diagnosis includes MSK fracture, dislocation, sprain, strain  ? ? ?Co morbidities that complicate the patient evaluation ?Obesity  ? ?Additional history obtained:  ?Additional history obtained from: None ?External records from outside source obtained and reviewed including: primary care office visit physician notes from 2021.  ? ?EKG: ?EKG: Not required ? ?Cardiac Monitoring: ?The patient was not required to be on cardiac monitor ? ?Lab Results: ?I personally ordered, reviewed, and interpreted labs. ?Pertinent results include: ?No labs required ? ?Imaging Studies ordered:  ?I ordered imaging studies which included x-ray.  I independently reviewed & interpreted imaging & am in agreement with radiology impression. ?Imaging shows: ?X-ray left knee with no acute abnormalities ?X-ray left foot with mild to moderate left plantar calcaneal heel  spur ? ?Medications  ?I ordered medication including ibuprofen for pain ?Reevaluation of the patient after medication shows that patient improved ?-I reviewed the patient's home medications and did not make adjustments. ?-I did

## 2021-08-06 NOTE — ED Notes (Addendum)
Patient able to ambulate to X-ray ?

## 2021-08-06 NOTE — Discharge Instructions (Addendum)
You are seen in the emergency department today for knee and foot pain.  Your imaging was normal.  You do likely have plantar fasciitis of the left foot which is exacerbating some of your knee pain.  I am referring you to podiatry as well as back to orthopedic surgery given that you have a knee replacement.  If the orthopedic does not call you in 3 days please give them a call.  I have provided their information in your discharge paperwork.  Please return if you are unable to walk.  Continue to ice for 15 to 20 minutes at a time over the knee.  I have provided some discharge instructions on plantar fasciitis to help with some of those symptoms.  Please take your ibuprofen 600 to 800 mg as needed every 8 hours on a schedule over the next few days. ?

## 2021-08-20 ENCOUNTER — Ambulatory Visit: Payer: 59 | Admitting: Orthopedic Surgery

## 2021-08-20 ENCOUNTER — Encounter: Payer: Self-pay | Admitting: Orthopedic Surgery

## 2021-08-20 DIAGNOSIS — M6702 Short Achilles tendon (acquired), left ankle: Secondary | ICD-10-CM | POA: Diagnosis not present

## 2021-08-20 DIAGNOSIS — M545 Low back pain, unspecified: Secondary | ICD-10-CM | POA: Diagnosis not present

## 2021-08-20 DIAGNOSIS — M25562 Pain in left knee: Secondary | ICD-10-CM

## 2021-08-20 DIAGNOSIS — M722 Plantar fascial fibromatosis: Secondary | ICD-10-CM | POA: Diagnosis not present

## 2021-08-20 DIAGNOSIS — G8929 Other chronic pain: Secondary | ICD-10-CM | POA: Diagnosis not present

## 2021-08-20 NOTE — Progress Notes (Signed)
? ?Office Visit Note ?  ?Patient: Kristen Woods           ?Date of Birth: 10-19-1965           ?MRN: [id number] ?Visit Date: 08/20/2021 ?             ?Requested by: Mickie Hillier, PA-C ?[address] ?[place],  [place] [address] ?PCP: Patient, No Pcp Per (Inactive) ? ?Chief Complaint  ?Patient presents with  ? Left Knee - Pain  ? Left Foot - Pain  ? ? ? ? ?HPI: ?Patient is a 56 year old woman who is seen for initial evaluation for chronic lower back pain with left planter fasciitis left Achilles contracture and a left total knee arthroplasty with persistent knee pain.  Patient states she has been diagnosed with sleep apnea but has not started treatment for this. ? ?Assessment & Plan: ?Visit Diagnoses:  ?1. Achilles tendon contracture, left   ?2. Chronic pain of left knee   ?3. Chronic midline low back pain without sciatica   ?4. Plantar fasciitis, left   ? ? ?Plan: Patient was given a prescription for physical therapy to work on strengthening of the left knee and Achilles stretching for the plantar fascia on the left.  Patient will follow-up in the [facility] health system for a physician for her sleep apnea.  She will follow-up with neurosurgery for her chronic back symptoms. ? ?Follow-Up Instructions: Return in about 2 months (around 10/20/2021).  ? ?Ortho Exam ? ?Patient is alert, oriented, no adenopathy, well-dressed, normal affect, normal respiratory effort. ?Examination patient has an antalgic gait.  She has Achilles contracture on the left with dorsiflexion about 20 degrees short of neutral with the knee extended she has a good dorsalis pedis pulse.  She has tenderness to palpation over the plantar fascia tenderness with dorsiflexion of the ankle.  Patient has a history of venous stasis insufficiency swelling but there were no ulcers.  Examination the left knee there is no effusion no redness no cellulitis she has range of motion from 0 to 120 degrees her patella tracks midline.  Review of the radiographs of the left  knee shows a Zimmer noncemented total knee arthroplasty without a patella component.  The joint is congruent.  Examination of the radiographs of her foot shows no bony abnormalities. ? ?Imaging: ?No results found. ?No images are attached to the encounter. ? ?Labs: ?No results found for: HGBA1C, ESRSEDRATE, CRP, LABURIC, REPTSTATUS, GRAMSTAIN, CULT, LABORGA ? ? ?Lab Results  ?Component Value Date  ? ALBUMIN 3.5 10/31/2013  ? ? ?No results found for: MG ?No results found for: VD25OH ? ?No results found for: PREALBUMIN ? ?  Latest Ref Rng & Units 09/13/2015  ?  5:34 PM 11/07/2014  ?  9:10 PM 10/31/2013  ?  8:45 PM  ?CBC EXTENDED  ?WBC 4.0 - 10.5 K/uL 8.7   9.2   10.6    ?RBC 3.87 - 5.11 MIL/uL 3.89   4.21   3.82    ?Hemoglobin 12.0 - 15.0 g/dL 10.8   11.8   10.7    ?HCT 36.0 - 46.0 % 33.3   36.5   32.7    ?Platelets 150 - 400 K/uL 422   417   418    ?NEUT# 1.7 - 7.7 K/uL 5.8   4.3   6.8    ?Lymph# 0.7 - 4.0 K/uL 1.7   3.2   2.0    ? ? ? ?There is no height or weight on file  to calculate BMI. ? ?Orders:  ?No orders of the defined types were placed in this encounter. ? ?No orders of the defined types were placed in this encounter. ? ? ? Procedures: ?No procedures performed ? ?Clinical Data: ?No additional findings. ? ?ROS: ? ?All other systems negative, except as noted in the HPI. ?Review of Systems ? ?Objective: ?Vital Signs: There were no vitals taken for this visit. ? ?Specialty Comments:  ?No specialty comments available. ? ?PMFS History: ?Patient Active Problem List  ? Diagnosis Date Noted  ? Lower back pain 01/12/2015  ? Neck pain 10/05/2014  ? Left arm pain 10/05/2014  ? Dysesthesia 10/05/2014  ? Shoulder pain, left 10/05/2014  ? Left knee pain 08/02/2012  ? Right wrist pain 02/09/2012  ? Left leg pain 11/17/2011  ? ?Past Medical History:  ?Diagnosis Date  ? Hypertension   ? Leg pain   ? Liver disease   ? Nerves   ? Vision abnormalities   ?  ?Family History  ?Problem Relation Age of Onset  ? Hypertension Mother   ?  Diabetes Father   ? Hyperlipidemia Other   ? Heart attack Neg Hx   ? Sudden death Neg Hx   ?  ?Past Surgical History:  ?Procedure Laterality Date  ? BREAST SURGERY    ? INCONTINENCE SURGERY    ? KNEE SURGERY    ? ?Social History  ? ?Occupational History  ? Not on file  ?Tobacco Use  ? Smoking status: Never  ? Smokeless tobacco: Not on file  ?Substance and Sexual Activity  ? Alcohol use: No  ? Drug use: No  ? Sexual activity: Not on file  ? ? ? ? ? ?

## 2021-08-29 ENCOUNTER — Ambulatory Visit: Payer: 59 | Admitting: Podiatry

## 2021-08-29 ENCOUNTER — Encounter: Payer: Self-pay | Admitting: Podiatry

## 2021-08-29 DIAGNOSIS — M722 Plantar fascial fibromatosis: Secondary | ICD-10-CM | POA: Diagnosis not present

## 2021-08-29 DIAGNOSIS — M7732 Calcaneal spur, left foot: Secondary | ICD-10-CM | POA: Diagnosis not present

## 2021-08-29 DIAGNOSIS — M79672 Pain in left foot: Secondary | ICD-10-CM

## 2021-08-29 MED ORDER — TRIAMCINOLONE ACETONIDE 10 MG/ML IJ SUSP
10.0000 mg | Freq: Once | INTRAMUSCULAR | Status: AC
  Administered 2021-08-29: 10 mg

## 2021-08-29 MED ORDER — MELOXICAM 15 MG PO TABS: 15.0000 mg | ORAL_TABLET | Freq: Every day | ORAL | 0 refills | Status: DC | PRN

## 2021-08-29 MED ORDER — CYCLOBENZAPRINE HCL 10 MG PO TABS: 10.0000 mg | ORAL_TABLET | Freq: Three times a day (TID) | ORAL | 0 refills | Status: DC | PRN

## 2021-08-29 NOTE — Patient Instructions (Addendum)
For instructions on how to put on your Plantar Fascial Brace, please visit [email or web address] ? ? ?While at your visit today you received a steroid injection in your foot or ankle to help with your pain. Along with having the steroid medication there is some "numbing" medication in the shot that you received. Due to this you may notice some numbness to the area for the next couple of hours.  ? ?I would recommend limiting activity for the next few days to help the steroid injection take affect.  ?  ?The actually benefit from the steroid injection may take up to 2-7 days to see a difference. You may actually experience a small (as in 10%) INCREASE in pain in the first 24 hours---that is common. It would be best if you can ice the area today and take anti-inflammatory medications (such as Ibuprofen, Motrin, or Aleve)  if you are able to take these medications. If you were prescribed another medication to help with the pain go ahead and start that medication today  ?  ?Things to watch out for that you should contact us or a health care provider urgently would include: ?1. Unusual (as in more than 10%) increase in pain ?2. New fever > 101.5 ?3. New swelling or redness of the injected area.  ?4. Streaking of red lines around the area injected. ? ?If you have any questions or concerns about this, please give our office a call at [phone].  ? ?Plantar Fasciitis (Heel Spur Syndrome) ?with Rehab ?The plantar fascia is a fibrous, ligament-like, soft-tissue structure that spans the bottom of the foot. Plantar fasciitis is a condition that causes pain in the foot due to inflammation of the tissue. ?SYMPTOMS  ?Pain and tenderness on the underneath side of the foot. ?Pain that worsens with standing or walking. ?CAUSES  ?Plantar fasciitis is caused by irritation and injury to the plantar fascia on the underneath side of the foot. Common mechanisms of injury include: ?Direct trauma to bottom of the foot. ?Damage to a small  nerve that runs under the foot where the main fascia attaches to the heel bone. ?Stress placed on the plantar fascia due to bone spurs. ?RISK INCREASES WITH:  ?Activities that place stress on the plantar fascia (running, jumping, pivoting, or cutting). ?Poor strength and flexibility. ?Improperly fitted shoes. ?Tight calf muscles. ?Flat feet. ?Failure to warm-up properly before activity. ?Obesity. ?PREVENTION ?Warm up and stretch properly before activity. ?Allow for adequate recovery between workouts. ?Maintain physical fitness: ?Strength, flexibility, and endurance. ?Cardiovascular fitness. ?Maintain a health body weight. ?Avoid stress on the plantar fascia. ?Wear properly fitted shoes, including arch supports for individuals who have flat feet. ? ?PROGNOSIS  ?If treated properly, then the symptoms of plantar fasciitis usually resolve without surgery. However, occasionally surgery is necessary. ? ?RELATED COMPLICATIONS  ?Recurrent symptoms that may result in a chronic condition. ?Problems of the lower back that are caused by compensating for the injury, such as limping. ?Pain or weakness of the foot during push-off following surgery. ?Chronic inflammation, scarring, and partial or complete fascia tear, occurring more often from repeated injections. ? ?TREATMENT  ?Treatment initially involves the use of ice and medication to help reduce pain and inflammation. The use of strengthening and stretching exercises may help reduce pain with activity, especially stretches of the Achilles tendon. These exercises may be performed at home or with a therapist. Your caregiver may recommend that you use heel cups of arch supports to help reduce stress on the plantar fascia. Occasionally,  corticosteroid injections are given to reduce inflammation. If symptoms persist for greater than 6 months despite non-surgical (conservative), then surgery may be recommended.  ? ?MEDICATION  ?If pain medication is necessary, then nonsteroidal  anti-inflammatory medications, such as aspirin and ibuprofen, or other minor pain relievers, such as acetaminophen, are often recommended. ?Do not take pain medication within 7 days before surgery. ?Prescription pain relievers may be given if deemed necessary by your caregiver. Use only as directed and only as much as you need. ?Corticosteroid injections may be given by your caregiver. These injections should be reserved for the most serious cases, because they may only be given a certain number of times. ? ?HEAT AND COLD ?Cold treatment (icing) relieves pain and reduces inflammation. Cold treatment should be applied for 10 to 15 minutes every 2 to 3 hours for inflammation and pain and immediately after any activity that aggravates your symptoms. Use ice packs or massage the area with a piece of ice (ice massage). ?Heat treatment may be used prior to performing the stretching and strengthening activities prescribed by your caregiver, physical therapist, or athletic trainer. Use a heat pack or soak the injury in warm water. ? ?SEEK IMMEDIATE MEDICAL CARE IF: ?Treatment seems to offer no benefit, or the condition worsens. ?Any medications produce adverse side effects. ? ?EXERCISES- ?RANGE OF MOTION (ROM) AND STRETCHING EXERCISES - Plantar Fasciitis (Heel Spur Syndrome) ?These exercises may help you when beginning to rehabilitate your injury. Your symptoms may resolve with or without further involvement from your physician, physical therapist or athletic trainer. While completing these exercises, remember:  ?Restoring tissue flexibility helps normal motion to return to the joints. This allows healthier, less painful movement and activity. ?An effective stretch should be held for at least 30 seconds. ?A stretch should never be painful. You should only feel a gentle lengthening or release in the stretched tissue. ? ?RANGE OF MOTION - Toe Extension, Flexion ?Sit with your right / left leg crossed over your opposite  knee. ?Grasp your toes and gently pull them back toward the top of your foot. You should feel a stretch on the bottom of your toes and/or foot. ?Hold this stretch for 10 seconds. ?Now, gently pull your toes toward the bottom of your foot. You should feel a stretch on the top of your toes and or foot. ?Hold this stretch for 10 seconds. ?Repeat  times. Complete this stretch 3 times per day.  ? ?RANGE OF MOTION - Ankle Dorsiflexion, Active Assisted ?Remove shoes and sit on a chair that is preferably not on a carpeted surface. ?Place right / left foot under knee. Extend your opposite leg for support. ?Keeping your heel down, slide your right / left foot back toward the chair until you feel a stretch at your ankle or calf. If you do not feel a stretch, slide your bottom forward to the edge of the chair, while still keeping your heel down. ?Hold this stretch for 10 seconds. ?Repeat 3 times. Complete this stretch 2 times per day.  ? ?STRETCH  G[employer], Standing ?Place hands on wall. ?Extend right / left leg, keeping the front knee somewhat bent. ?Slightly point your toes inward on your back foot. ?Keeping your right / left heel on the floor and your knee straight, shift your weight toward the wall, not allowing your back to arch. ?You should feel a gentle stretch in the right / left calf. Hold this position for 10 seconds. ?Repeat 3 times. Complete this stretch 2 times per  day. ? ?STRETCH  Soleus, Standing ?Place hands on wall. ?Extend right / left leg, keeping the other knee somewhat bent. ?Slightly point your toes inward on your back foot. ?Keep your right / left heel on the floor, bend your back knee, and slightly shift your weight over the back leg so that you feel a gentle stretch deep in your back calf. ?Hold this position for 10 seconds. ?Repeat 3 times. Complete this stretch 2 times per day. ? ?[employer], Standing  ?Note: This exercise can place a lot of stress on your foot and ankle. Please complete  this exercise only if specifically instructed by your caregiver.  ?Place the ball of your right / left foot on a step, keeping your other foot firmly on the same step. ?Hold on to the wall or a rail for balance

## 2021-08-31 NOTE — Progress Notes (Signed)
Subjective:  ? ?Patient ID: Kristen Woods, female   DOB: 56 y.o.   MRN: [id number]  ? ?HPI ?56 year old female presents the office with concerns of left heel pain which started about 5 months ago.  She says it worse in the morning when she gets up or after sitting for long period time.  Discussed that sharp pain.  She states that she previously been on meloxicam as well as Flexeril which seemed to help other issues.  She also has a history of sciatica on her left side.  No injuries that she reports.  She also has been getting some swelling and pain to her left knee.  I discussed with her we do not treat the knee. ? ? ?Review of Systems  ?All other systems reviewed and are negative. ? ?Past Medical History:  ?Diagnosis Date  ? Hypertension   ? Leg pain   ? Liver disease   ? Nerves   ? Vision abnormalities   ? ? ?Past Surgical History:  ?Procedure Laterality Date  ? BREAST SURGERY    ? INCONTINENCE SURGERY    ? KNEE SURGERY    ? ? ? ?Current Outpatient Medications:  ?  cyclobenzaprine (FLEXERIL) 10 MG tablet, Take 1 tablet (10 mg total) by mouth 3 (three) times daily as needed for muscle spasms., Disp: 30 tablet, Rfl: 0 ?  meloxicam (MOBIC) 15 MG tablet, Take 1 tablet (15 mg total) by mouth daily as needed for pain., Disp: 30 tablet, Rfl: 0 ?  ascorbic acid (VITAMIN C) 500 MG tablet, Take 500 mg by mouth daily., Disp: , Rfl:  ?  atorvastatin (LIPITOR) 20 MG tablet, , Disp: , Rfl:  ?  benzonatate (TESSALON) 100 MG capsule, Take 1 capsule (100 mg total) by mouth every 8 (eight) hours., Disp: 21 capsule, Rfl: 0 ?  Biotin 10 MG CAPS, Take 1 tablet by mouth daily., Disp: , Rfl:  ?  cyclobenzaprine (FLEXERIL) 10 MG tablet, Take 1 tablet (10 mg total) by mouth 3 (three) times daily as needed for muscle spasms., Disp: 90 tablet, Rfl: 3 ?  diclofenac (VOLTAREN) 75 MG EC tablet, Take 1 tablet (75 mg total) by mouth 2 (two) times daily as needed for moderate pain., Disp: 30 tablet, Rfl: 0 ?  gabapentin (NEURONTIN) 300 MG  capsule, One in am, one in evening and two at bedtime po, Disp: 120 capsule, Rfl: 11 ?  HYDROcodone-acetaminophen (NORCO/VICODIN) 5-325 MG per tablet, Take 1 tablet by mouth every 6 (six) hours as needed for moderate pain or severe pain., Disp: 10 tablet, Rfl: 0 ?  HYDROcodone-acetaminophen (NORCO/VICODIN) 5-325 MG tablet, Take 1-2 tablets by mouth every 6 (six) hours as needed for moderate pain., Disp: 10 tablet, Rfl: 0 ?  lisinopril-hydrochlorothiazide (PRINZIDE,ZESTORETIC) 20-25 MG per tablet, Take 1 tablet by mouth daily. , Disp: , Rfl:  ?  methylPREDNISolone (MEDROL DOSEPAK) 4 MG TBPK tablet, Take 6 tablets on day one, then decrease by one tablet each day until gone. (Patient not taking: Reported on 01/12/2015), Disp: 21 tablet, Rfl: 0 ?  Multiple Vitamins-Minerals (ALIVE WOMENS 50+ PO), Take 1 tablet by mouth 2 (two) times daily., Disp: , Rfl:  ? ?No Known Allergies ? ? ? ?   ?Objective:  ?Physical Exam  ?General: AAO x3, NAD ? ?Dermatological: Notably mildly hypertrophic, dystrophic and elongated x10.  No open lesions. ? ?Vascular: Dorsalis Pedis artery and Posterior Tibial artery pedal pulses are 2/4 bilateral with immedate capillary fill time. There is no pain with calf compression, swelling,  warmth, erythema.  ? ?Neruologic: Grossly intact via light touch bilateral. ? ?Musculoskeletal: Tenderness to palpation along the plantar medial tubercle of the calcaneus at the insertion of plantar fascia on the left foot. There is no pain along the course of the plantar fascia within the arch of the foot. Plantar fascia appears to be intact. There is no pain with lateral compression of the calcaneus or pain with vibratory sensation. There is no pain along the course or insertion of the achilles tendon. No other areas of tenderness to bilateral lower extremities. Muscular strength 5/5 in all groups tested bilateral. ? ?Gait: Unassisted, Nonantalgic.  ? ? ?   ?Assessment:  ? ?Left heel pain, Plantar fasciitis ? ?    ?Plan:  ?-Treatment options discussed including all alternatives, risks, and complications ?-Etiology of symptoms were discussed ?-X-rays were obtained and reviewed with the patient.  3 views of the left foot were obtained.  Evidence of acute fracture.  Calcaneal spurring is present. ?-I discussed steroid injection she wants to proceed with this.  See procedure note below.  Discussed oral prednisone but she states that is not helpful for her. ?-Prescribed meloxicam and she states that is more helpful in combination with the Flexeril.  I did prescribe both for her today. ?-Dispensed night splint as well as plantar fascial brace.  This will help aid in support and stability. ?-Discussed shoe modifications and good arch support. ?-I spent quite a bit of time talking to the patient prior to her symptoms.  She no further questions.  After I left the room she wanted to have the nails trimmed.  By the time I came back to the room the medical assistant already done this morning without any complications or bleeding.  Patient no further questions or concerns. ?-Discussed that she is to follow-up with orthopedics for her knee. ?-She also said that she has sciatica and some nerve issues since then treated and followed up when she was living in [place].  She does not bring the records from the next appointment. ? ?Procedure: Injection Tendon/Ligament ?Discussed alternatives, risks, complications and verbal consent was obtained.  ?Location: Left plantar fascia at the glabrous junction; medial approach. ?Skin Prep: Alcohol. ?Injectate: 0.5cc 0.5% marcaine plain, 0.5 cc 2% lidocaine plain and, 1 cc kenalog 10. ?Disposition: Patient tolerated procedure well. Injection site dressed with a band-aid.  ?Post-injection care was discussed and return precautions discussed.  ? ? ?Return in about 6 weeks (around 10/10/2021). ? ?Trula Slade DPM ? ?   ? ?

## 2021-09-11 NOTE — Therapy (Signed)
OUTPATIENT PHYSICAL THERAPY BACK & LOWER EXTREMITY EVALUATION   Patient Name: Kristen Woods MRN: [id number] DOB:[birth year], 56 y.o., female Today's Date: 09/12/2021   PT End of Session - 09/12/21 1530     Visit Number 1    Number of Visits --    Date for PT Re-Evaluation 11/07/21    Authorization Type [employer]    PT Start Time 1530    PT Stop Time 9476    PT Time Calculation (min) 51 min    Activity Tolerance Patient tolerated treatment well;Patient limited by pain    Behavior During Therapy [facility] for tasks assessed/performed             Past Medical History:  Diagnosis Date   Hypertension    Leg pain    Liver disease    Nerves    Vision abnormalities    Past Surgical History:  Procedure Laterality Date   BREAST SURGERY     INCONTINENCE SURGERY     KNEE SURGERY     Patient Active Problem List   Diagnosis Date Noted   Lower back pain 01/12/2015   Neck pain 10/05/2014   Left arm pain 10/05/2014   Dysesthesia 10/05/2014   Shoulder pain, left 10/05/2014   Left knee pain 08/02/2012   Right wrist pain 02/09/2012   Left leg pain 11/17/2011    PCP: n/a  REFERRING PROVIDER: Newt Minion, MD  REFERRING DIAG:  [phone] (ICD-10-CM) - Achilles tendon contracture, left  M25.562,G89.29 (ICD-10-CM) - Chronic pain of left knee  M54.50,G89.29 (ICD-10-CM) - Chronic midline low back pain without sciatica  M72.2 (ICD-10-CM) - Plantar fasciitis, left    THERAPY DIAG:  Other low back pain - Plan: PT plan of care cert/re-cert  Radiculopathy, lumbar region - Plan: PT plan of care cert/re-cert  Muscle spasm of back - Plan: PT plan of care cert/re-cert  Cramp and spasm - Plan: PT plan of care cert/re-cert  Muscle weakness (generalized) - Plan: PT plan of care cert/re-cert  Pain in left foot - Plan: PT plan of care cert/re-cert  Chronic pain of left knee - Plan: PT plan of care cert/re-cert  RATIONALE FOR EVALUATION AND TREATMENT: Rehabilitation  ONSET DATE: 2 months  for L foot; chronic for LBP since at least 2015  SUBJECTIVE:   SUBJECTIVE STATEMENT: Pt reports she has bone spur in her L foot - MD gave her a splint and a boot but had to stop wearing the boot after 2 days due to the uneven height aggravating her sciatica. She reports her L leg always more swollen and painful than the R. She also reports chronic sciatica on the L which is associated with increased swelling in her L glutes when exacerbated. She has tried multiple therapy interventions in the past which only seem to further aggravate her symptoms. Sometimes pain is so bad that her buttocks feels like it will come off her body. Her main concern is with the L buttock pain right now.  PERTINENT HISTORY: HTN, knee surgery - L TKA ~2017, chronic LBP, neck pain, L shoulder pain, chronic L LE pain  PAIN:  Are you having pain? Yes: NPRS scale:  8/10 Pain location: L buttock Pain description: ongoing stab Aggravating factors: prolonged walking Relieving factors: rest, lean against wall, TENS  Are you having pain? Yes: NPRS scale: 6-7/10 Pain location: L knee and heel Pain description: constant in heel Aggravating factors: everything Relieving factors: nothing  PRECAUTIONS: Fall  WEIGHT BEARING RESTRICTIONS No  FALLS:  Has patient  fallen in last 6 months? Yes. 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 Status post right knee  arthroplasty without evidence of complication.  08/06/21 - L foot x-ray: Mild-to-moderate plantar calcaneal heel spur. 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  06/21/19 - Lumbar x-ray: 1. Mild degenerative disease in the lower lumbar spine, primarily facet related. 2. Joint space loss with associated sclerosis at the sacroiliac joints, which may be due to degenerative disease or inflammatory arthropathy.   PATIENT SURVEYS:  FOTO Lumbar = 45, predicted = 52  COGNITION:  Overall cognitive status: Within functional limits for tasks assessed     SENSATION: L LE radicular pain, numbness and tingling initially in L thigh but can extend throughout the whole LE  EDEMA:  Increased edema t/o L buttock and LE   MUSCLE LENGTH: Hamstrings: mild tight L>R ITB: mild tight L>R Piriformis: mild/mod tight L>R Hip flexors: mild/mod tight L>R Quads: mild/mod tight L>R Heelcord: NT  POSTURE:  increased lumbar lordosis, anterior pelvic tilt, and slight genu recurvatum  PALPATION: Increased muscle tension and TTP in L>R lumbar paraspinals, glutes, piriformis and HS  LUMBAR ROM:   Active  AROM  eval  Flexion Fingertips to floor  Extension 90% limited  Right lateral flexion Hand to lateral knee  Left lateral flexion Hand to lateral knee  Right rotation 25% limited  Left rotation 30% limited   (Blank rows = not tested) *Increased pain with all lumbar motions  LOWER EXTREMITY ROM:   Knee and ankle ROM to be assessed next visit Active ROM Right eval Left eval  Hip flexion    Hip extension    Hip abduction    Hip adduction    Hip internal rotation    Hip external rotation    Knee flexion    Knee extension    Ankle dorsiflexion    Ankle plantarflexion    Ankle inversion    Ankle eversion     (Blank rows = not tested)  LOWER EXTREMITY MMT:  MMT Right eval Left eval  Hip flexion 4 3-  Hip extension 4- 2+  Hip abduction 4 3+  Hip adduction 4- 2+  Hip  internal rotation 4+ 3+  Hip external rotation 4- 3  Knee flexion 4 4-  Knee extension 4 4  Ankle dorsiflexion 4 3+  Ankle plantarflexion    Ankle inversion    Ankle eversion     (Blank rows = not tested)  LUMBAR SPECIAL TESTS:  Straight leg raise test: Negative  GAIT: Distance walked: 60 Assistive device utilized: None Level of assistance: Complete Independence Comments: Antalgic gait with decreased weight shift to L LE, decreased stance time on L and limited heel toe progression on L    TODAY'S TREATMENT: Eval only   PATIENT EDUCATION:  Education details: PT eval findings, anticipated POC, role of DN, and need for further assessment of L knee and ankle/foot Person educated:  Patient Education method: Explanation and Handouts Education comprehension: verbalized understanding and needs further education   HOME EXERCISE PROGRAM: TBD  ASSESSMENT:  CLINICAL IMPRESSION: Kristen Woods is a 56 y.o. female who was seen today for physical therapy evaluation and treatment for chronic LBP/L buttock pain with L sciatica, L knee pain s/p TKA, L Achilles tendon contracture and L heel pain/plantar fasciitis. She reports onset of low back pain beginning several years ago around 2015 with acute on chronic exacerbation of L knee and heel pain ~2 months ago upon starting a new job where she is mostly on her feet all day. At this point her greatest concern and desire for primary focus with PT is the low back/L buttock pain. Pain is worse with prolonged standing or walking and limits pt in daily mobility/ambulation as well as ability to be on her feet while at work as the Immunologist of a hotel. Milo has deficits in lumbar AROM in all planes, L > R LE strength and proximal LE as well as L ankle flexibility affecting ADLs and locomotion. Further assessment necessary to determine full extent of L knee and foot/ankle deficits. Harshika will benefit from skilled PT to address above deficits  and improve flexibility/ROM along with core/proximal LE strength to improve activity tolerance with decreased pain interference.  OBJECTIVE IMPAIRMENTS Abnormal gait, decreased activity tolerance, decreased knowledge of condition, decreased mobility, difficulty walking, decreased ROM, decreased strength, increased edema, increased fascial restrictions, impaired perceived functional ability, increased muscle spasms, impaired flexibility, impaired sensation, improper body mechanics, postural dysfunction, and pain.   ACTIVITY LIMITATIONS carrying, lifting, bending, sitting, standing, squatting, sleeping, stairs, transfers, bed mobility, bathing, dressing, locomotion level, and caring for others  PARTICIPATION LIMITATIONS: meal prep, cleaning, laundry, driving, shopping, community activity, occupation, and yard work  PERSONAL Education officer, community, Past/current experiences, Profession, Time since onset of injury/illness/exacerbation, and 3+ comorbidities: HTN, knee surgery - L TKA ~2017, chronic LBP, neck pain, L shoulder pain, chronic L LE pain  are also affecting patient's functional outcome.   REHAB POTENTIAL: Good  CLINICAL DECISION MAKING: Unstable/unpredictable  EVALUATION COMPLEXITY: High   GOALS: Goals reviewed with patient? Yes  SHORT TERM GOALS: Target date: 10/10/2021   Patient will be independent with initial HEP.  Baseline:  Goal status: INITIAL  2.  Patient will report centralization of radicular symptoms.  Baseline:  Goal status: INITIAL  3.  Complete L knee and foot/ankle assessments and update goal and POC as indicated Baseline:  Goal status: INITIAL   LONG TERM GOALS: Target date: 11/07/2021   Patient will be independent with advanced/ongoing HEP to improve outcomes and carryover.  Baseline:  Goal status: INITIAL  2.  Patient will report 50% improvement in low back and L buttock pain to improve QOL.  Baseline: 8/10 Goal status: INITIAL  3.  Patient to demonstrate  ability to achieve and maintain good spinal alignment/posturing and body mechanics needed for daily activities. Baseline:  Goal status: INITIAL  4.  Patient will demonstrate pain free lumbar AROM WFL to perform ADLs.   Baseline:  Goal status: INITIAL  5.  Patient will demonstrate improved B LE strength to >/= 4 to 4+/5 for improved stability and ease of mobility  Baseline: Refer to above LE MMT chart Goal status: INITIAL  6.  Patient will report 14 on lumbar FOTO to demonstrate improved functional ability.  Baseline: 45 Goal status: INITIAL   7.  Patient will tolerate >/= 60 min of standing or walking to improve tolerance for being  on her feet the front desk at work. Baseline:  Goal status: INITIAL  8.  Patient will be able to ambulate 600' with or w/o LRAD and normal gait pattern without increased low back, L knee or foot/ankle pain to access community.  Baseline:  Goal status: INITIAL  9. Patient will be able to ascend/descend stairs with 1 HR and reciprocal step pattern safely to access home and community.  Baseline:  Goal status: INITIAL   PLAN: PT FREQUENCY: 2x/week  PT DURATION: 8 weeks  PLANNED INTERVENTIONS: Therapeutic exercises, Therapeutic activity, Neuromuscular re-education, Balance training, Gait training, Patient/Family education, Joint mobilization, Stair training, Orthotic/Fit training, DME instructions, Dry Needling, Electrical stimulation, Spinal mobilization, Cryotherapy, Moist heat, Taping, Vasopneumatic device, Traction, Ultrasound, Manual therapy, and Re-evaluation  PLAN FOR NEXT SESSION: Create initial HEP for lumbopelvic flexibility and strengthening; MT +/- DN and/or modalities to address pain and abnormal muscle tension PRN; complete L knee and foot/ankle assessment as able   Percival Spanish, PT 09/12/2021, 7:52 PM

## 2021-09-12 ENCOUNTER — Ambulatory Visit: Payer: 59 | Attending: Orthopedic Surgery | Admitting: Physical Therapy

## 2021-09-12 ENCOUNTER — Other Ambulatory Visit: Payer: Self-pay

## 2021-09-12 ENCOUNTER — Encounter: Payer: Self-pay | Admitting: Physical Therapy

## 2021-09-12 DIAGNOSIS — M5459 Other low back pain: Secondary | ICD-10-CM | POA: Diagnosis not present

## 2021-09-12 DIAGNOSIS — M25562 Pain in left knee: Secondary | ICD-10-CM | POA: Diagnosis not present

## 2021-09-12 DIAGNOSIS — G8929 Other chronic pain: Secondary | ICD-10-CM | POA: Diagnosis not present

## 2021-09-12 DIAGNOSIS — M6281 Muscle weakness (generalized): Secondary | ICD-10-CM | POA: Insufficient documentation

## 2021-09-12 DIAGNOSIS — M6702 Short Achilles tendon (acquired), left ankle: Secondary | ICD-10-CM | POA: Insufficient documentation

## 2021-09-12 DIAGNOSIS — M722 Plantar fascial fibromatosis: Secondary | ICD-10-CM | POA: Insufficient documentation

## 2021-09-12 DIAGNOSIS — M545 Low back pain, unspecified: Secondary | ICD-10-CM | POA: Insufficient documentation

## 2021-09-12 DIAGNOSIS — R252 Cramp and spasm: Secondary | ICD-10-CM | POA: Diagnosis not present

## 2021-09-12 DIAGNOSIS — M5416 Radiculopathy, lumbar region: Secondary | ICD-10-CM | POA: Insufficient documentation

## 2021-09-12 DIAGNOSIS — M62830 Muscle spasm of back: Secondary | ICD-10-CM | POA: Insufficient documentation

## 2021-09-12 DIAGNOSIS — M79672 Pain in left foot: Secondary | ICD-10-CM | POA: Insufficient documentation

## 2021-09-23 ENCOUNTER — Ambulatory Visit: Payer: 59 | Attending: Orthopedic Surgery

## 2021-09-23 DIAGNOSIS — M62830 Muscle spasm of back: Secondary | ICD-10-CM | POA: Diagnosis not present

## 2021-09-23 DIAGNOSIS — M25562 Pain in left knee: Secondary | ICD-10-CM | POA: Insufficient documentation

## 2021-09-23 DIAGNOSIS — M6281 Muscle weakness (generalized): Secondary | ICD-10-CM

## 2021-09-23 DIAGNOSIS — G8929 Other chronic pain: Secondary | ICD-10-CM | POA: Diagnosis not present

## 2021-09-23 DIAGNOSIS — R252 Cramp and spasm: Secondary | ICD-10-CM

## 2021-09-23 DIAGNOSIS — M5459 Other low back pain: Secondary | ICD-10-CM

## 2021-09-23 DIAGNOSIS — M79672 Pain in left foot: Secondary | ICD-10-CM

## 2021-09-23 DIAGNOSIS — M5416 Radiculopathy, lumbar region: Secondary | ICD-10-CM | POA: Diagnosis not present

## 2021-09-23 DIAGNOSIS — G4733 Obstructive sleep apnea (adult) (pediatric): Secondary | ICD-10-CM | POA: Diagnosis not present

## 2021-09-23 NOTE — Therapy (Addendum)
OUTPATIENT PHYSICAL THERAPY TREATMENT / DISCHARGE SUMMARY   Patient Name: Kristen Woods MRN: [id number] DOB:Sep 01, 1965, 56 y.o., female Today's Date: 09/23/2021   PT End of Session - 09/23/21 1626     Visit Number 2    Date for PT Re-Evaluation 11/07/21    Authorization Type [employer]    PT Start Time 1532    PT Stop Time 1617    PT Time Calculation (min) 45 min    Activity Tolerance Patient tolerated treatment well;Patient limited by pain    Behavior During Therapy [facility] for tasks assessed/performed              Past Medical History:  Diagnosis Date   Hypertension    Leg pain    Liver disease    Nerves    Vision abnormalities    Past Surgical History:  Procedure Laterality Date   BREAST SURGERY     INCONTINENCE SURGERY     KNEE SURGERY     Patient Active Problem List   Diagnosis Date Noted   Lower back pain 01/12/2015   Neck pain 10/05/2014   Left arm pain 10/05/2014   Dysesthesia 10/05/2014   Shoulder pain, left 10/05/2014   Left knee pain 08/02/2012   Right wrist pain 02/09/2012   Left leg pain 11/17/2011    PCP: n/a  REFERRING PROVIDER: Newt Minion, MD  REFERRING DIAG:  [phone] (ICD-10-CM) - Achilles tendon contracture, left  M25.562,G89.29 (ICD-10-CM) - Chronic pain of left knee  M54.50,G89.29 (ICD-10-CM) - Chronic midline low back pain without sciatica  M72.2 (ICD-10-CM) - Plantar fasciitis, left    THERAPY DIAG:  Other low back pain  Radiculopathy, lumbar region  Muscle spasm of back  Cramp and spasm  Muscle weakness (generalized)  Pain in left foot  Chronic pain of left knee  RATIONALE FOR EVALUATION AND TREATMENT: Rehabilitation  ONSET DATE: 2 months for L foot; chronic for LBP since at least 2015  SUBJECTIVE:   SUBJECTIVE STATEMENT: "Everything hurts today."  PERTINENT HISTORY: HTN, knee surgery - L TKA ~2017, chronic LBP, neck pain, L shoulder pain, chronic L LE pain  PAIN:  Are you having pain? Yes: NPRS scale:  10/10 Pain location: L buttock and mid low back Pain description: ongoing stab Aggravating factors: prolonged walking Relieving factors: rest, lean against wall, TENS  Are you having pain? Yes: NPRS scale: 6-7/10 Pain location: L knee and heel Pain description: constant in heel Aggravating factors: everything Relieving factors: nothing  PRECAUTIONS: Fall  WEIGHT BEARING RESTRICTIONS No  FALLS:  Has patient fallen in last 6 months? Yes. 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 Status post right knee arthroplasty without evidence of complication.  08/06/21 - L foot x-ray: Mild-to-moderate plantar calcaneal heel spur. 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  06/21/19 - Lumbar x-ray: 1. Mild degenerative disease in the lower lumbar spine, primarily facet related. 2. Joint space loss with associated  sclerosis at the sacroiliac joints, which may be due to degenerative disease or inflammatory arthropathy.   PATIENT SURVEYS:  FOTO Lumbar = 45, predicted = 52  COGNITION:  Overall cognitive status: Within functional limits for tasks assessed     SENSATION: L LE radicular pain, numbness and tingling initially in L thigh but can extend throughout the whole LE  EDEMA:  Increased edema t/o L buttock and LE   MUSCLE LENGTH: Hamstrings: mild tight L>R ITB: mild tight L>R Piriformis: mild/mod  tight L>R Hip flexors: mild/mod tight L>R Quads: mild/mod tight L>R Heelcord: NT  POSTURE:  increased lumbar lordosis, anterior pelvic tilt, and slight genu recurvatum  PALPATION: Increased muscle tension and TTP in L>R lumbar paraspinals, glutes, piriformis and HS  LUMBAR ROM:   Active  AROM  eval  Flexion Fingertips to floor  Extension 90% limited  Right lateral flexion Hand to lateral knee  Left lateral flexion Hand to lateral knee  Right rotation 25% limited  Left rotation 30% limited   (Blank rows = not tested) *Increased pain with all lumbar motions  LOWER EXTREMITY ROM:   Knee and ankle ROM to be assessed next visit Active ROM Right 09/23/21 Left eval  Hip flexion    Hip extension    Hip abduction    Hip adduction    Hip internal rotation    Hip external rotation    Knee flexion 110 100  Knee extension -4 -6  Ankle dorsiflexion 2 4  Ankle plantarflexion 39 50  Ankle inversion    Ankle eversion     (Blank rows = not tested)  LOWER EXTREMITY MMT:  MMT Right eval Left eval  Hip flexion 4 3-  Hip extension 4- 2+  Hip abduction 4 3+  Hip adduction 4- 2+  Hip internal rotation 4+ 3+  Hip external rotation 4- 3  Knee flexion 4 4-  Knee extension 4 4  Ankle dorsiflexion 4 3+  Ankle plantarflexion    Ankle inversion    Ankle eversion     (Blank rows = not tested)  LUMBAR SPECIAL TESTS:  Straight leg raise test: Negative  GAIT: Distance walked: 60 Assistive device utilized: None Level of assistance: Complete Independence Comments: Antalgic gait with decreased weight shift to L LE, decreased stance time on L and limited heel toe progression on L    TODAY'S TREATMENT: 09/23/21 Therapeutic Exercise: Nustep L2x14mn Supine HS stretch with strap bil x 30 sec Mod thomas stretch with strap L 2x30 sec L piriformis stretch KTOS x 30 L figure 4 stretch supine 2x30" Prone press ups 8x3"; unable to hold d/t pain Standing extension with arms on wall  8x3"  Eval only   PATIENT EDUCATION:  Education details: PT eval findings, anticipated POC, role of DN, and need for further assessment of L knee and ankle/foot Person educated: Patient Education method: Explanation and Handouts Education comprehension: verbalized understanding and needs further education   HOME EXERCISE PROGRAM: Access Code: [email or web address] URL: [email or web address] Date: 09/23/2021 Prepared by: BClarene Essex Exercises - Supine Quadriceps Stretch with Strap on Table  - 2 x daily - 7 x weekly - 2 sets - 2 reps - 30 sec hold - Supine Figure 4 Piriformis Stretch  - 2 x daily - 7 x weekly - 2 sets - 2 reps - 30 sec hold - Supine Heel Slide  - 2 x daily - 7 x weekly - 2 sets - 10 reps - 5 sec hold hold - Standing Lumbar Extension  -  2 x daily - 7 x weekly - 2 sets - 10 reps - 5 sec  hold  ASSESSMENT:  CLINICAL IMPRESSION: Pt reported constantly being in pain and at this point she is used to it. She was able to complete all exercises today, observed that she does have an anteriorly tilted pelvis which can be straining her hips and LB. L knee flexion is limited far more than R side. Provided instruction for HEP and focused on lumbopelvic stretching and ROM to reduce tension on joints.  OBJECTIVE IMPAIRMENTS Abnormal gait, decreased activity tolerance, decreased knowledge of condition, decreased mobility, difficulty walking, decreased ROM, decreased strength, increased edema, increased fascial restrictions, impaired perceived functional ability, increased muscle spasms, impaired flexibility, impaired sensation, improper body mechanics, postural dysfunction, and pain.   ACTIVITY LIMITATIONS carrying, lifting, bending, sitting, standing, squatting, sleeping, stairs, transfers, bed mobility, bathing, dressing, locomotion level, and caring for others  PARTICIPATION LIMITATIONS: meal prep, cleaning, laundry, driving, shopping, community activity, occupation, and yard  work  PERSONAL Education officer, community, Past/current experiences, Profession, Time since onset of injury/illness/exacerbation, and 3+ comorbidities: HTN, knee surgery - L TKA ~2017, chronic LBP, neck pain, L shoulder pain, chronic L LE pain  are also affecting patient's functional outcome.   REHAB POTENTIAL: Good  CLINICAL DECISION MAKING: Unstable/unpredictable  EVALUATION COMPLEXITY: High   GOALS: Goals reviewed with patient? Yes  SHORT TERM GOALS: Target date: 10/10/2021   Patient will be independent with initial HEP.  Baseline:  Goal status: INITIAL  2.  Patient will report centralization of radicular symptoms.  Baseline:  Goal status: INITIAL  3.  Complete L knee and foot/ankle assessments and update goal and POC as indicated Baseline:  Goal status: INITIAL   LONG TERM GOALS: Target date: 11/07/2021   Patient will be independent with advanced/ongoing HEP to improve outcomes and carryover.  Baseline:  Goal status: INITIAL  2.  Patient will report 50% improvement in low back and L buttock pain to improve QOL.  Baseline: 8/10 Goal status: INITIAL  3.  Patient to demonstrate ability to achieve and maintain good spinal alignment/posturing and body mechanics needed for daily activities. Baseline:  Goal status: INITIAL  4.  Patient will demonstrate pain free lumbar AROM WFL to perform ADLs.   Baseline:  Goal status: INITIAL  5.  Patient will demonstrate improved B LE strength to >/= 4 to 4+/5 for improved stability and ease of mobility  Baseline: Refer to above LE MMT chart Goal status: INITIAL  6.  Patient will report 48 on lumbar FOTO to demonstrate improved functional ability.  Baseline: 45 Goal status: INITIAL   7.  Patient will tolerate >/= 60 min of standing or walking to improve tolerance for being on her feet the front desk at work. Baseline:  Goal status: INITIAL  8.  Patient will be able to ambulate 600' with or w/o LRAD and normal gait pattern without  increased low back, L knee or foot/ankle pain to access community.  Baseline:  Goal status: INITIAL  9. Patient will be able to ascend/descend stairs with 1 HR and reciprocal step pattern safely to access home and community.  Baseline:  Goal status: INITIAL   PLAN: PT FREQUENCY: 2x/week  PT DURATION: 8 weeks  PLANNED INTERVENTIONS: Therapeutic exercises, Therapeutic activity, Neuromuscular re-education, Balance training, Gait training, Patient/Family education, Joint mobilization, Stair training, Orthotic/Fit training, DME instructions, Dry Needling, Electrical stimulation, Spinal mobilization, Cryotherapy, Moist heat, Taping, Vasopneumatic device, Traction, Ultrasound, Manual therapy, and Re-evaluation  PLAN FOR NEXT SESSION: review  HEP; MT +/- DN and/or modalities to address pain and abnormal muscle tension PRN; complete L knee and foot/ankle assessment as able   Artist Pais, PTA 09/23/2021, 4:44 PM   PHYSICAL THERAPY DISCHARGE SUMMARY  Visits from Start of Care: 2  Current functional level related to goals / functional outcomes:   Refer to above clinical impression for status as of last visit on 09/23/21. Patient cancelled next visit and no showed for the next 2 visits. When called for 2nd NS, she informed us that the MD told her to stop PT due to new diagnosis of Lupus, therefore will proceed with discharge from PT for this episode.   Remaining deficits:   As above. Pt has not returned for further assessment.   Education / Equipment:   Initial HEP   Patient agrees to discharge. Patient goals were not met. Patient is being discharged due to the patient's request.  Percival Spanish, PT, MPT 10/03/21, 4:25 PM  [facility] [address]  [place], [place], [address] Phone: [phone]   Fax:  [phone]

## 2021-09-25 ENCOUNTER — Ambulatory Visit (INDEPENDENT_AMBULATORY_CARE_PROVIDER_SITE_OTHER): Payer: 59

## 2021-09-25 ENCOUNTER — Encounter: Payer: Self-pay | Admitting: Specialist

## 2021-09-25 ENCOUNTER — Encounter (HOSPITAL_BASED_OUTPATIENT_CLINIC_OR_DEPARTMENT_OTHER): Payer: Self-pay | Admitting: Emergency Medicine

## 2021-09-25 ENCOUNTER — Emergency Department (HOSPITAL_BASED_OUTPATIENT_CLINIC_OR_DEPARTMENT_OTHER)
Admission: EM | Admit: 2021-09-25 | Discharge: 2021-09-25 | Disposition: A | Discharge status: Home / Self Care | Payer: 59 | Attending: Emergency Medicine | Admitting: Emergency Medicine

## 2021-09-25 ENCOUNTER — Other Ambulatory Visit: Payer: Self-pay

## 2021-09-25 ENCOUNTER — Ambulatory Visit (INDEPENDENT_AMBULATORY_CARE_PROVIDER_SITE_OTHER): Payer: 59 | Admitting: Specialist

## 2021-09-25 ENCOUNTER — Emergency Department (HOSPITAL_BASED_OUTPATIENT_CLINIC_OR_DEPARTMENT_OTHER): Payer: 59

## 2021-09-25 VITALS — BP 140/80 | HR 91 | Ht 66.0 in | Wt 296.0 lb

## 2021-09-25 DIAGNOSIS — M25561 Pain in right knee: Secondary | ICD-10-CM | POA: Diagnosis not present

## 2021-09-25 DIAGNOSIS — M4726 Other spondylosis with radiculopathy, lumbar region: Secondary | ICD-10-CM

## 2021-09-25 DIAGNOSIS — M545 Low back pain, unspecified: Secondary | ICD-10-CM | POA: Diagnosis not present

## 2021-09-25 DIAGNOSIS — M47817 Spondylosis without myelopathy or radiculopathy, lumbosacral region: Secondary | ICD-10-CM

## 2021-09-25 DIAGNOSIS — Z96653 Presence of artificial knee joint, bilateral: Secondary | ICD-10-CM | POA: Diagnosis not present

## 2021-09-25 DIAGNOSIS — M4316 Spondylolisthesis, lumbar region: Secondary | ICD-10-CM | POA: Diagnosis not present

## 2021-09-25 DIAGNOSIS — M533 Sacrococcygeal disorders, not elsewhere classified: Secondary | ICD-10-CM

## 2021-09-25 DIAGNOSIS — Z791 Long term (current) use of non-steroidal anti-inflammatories (NSAID): Secondary | ICD-10-CM | POA: Diagnosis not present

## 2021-09-25 DIAGNOSIS — G8929 Other chronic pain: Secondary | ICD-10-CM | POA: Diagnosis not present

## 2021-09-25 DIAGNOSIS — M5136 Other intervertebral disc degeneration, lumbar region: Secondary | ICD-10-CM

## 2021-09-25 DIAGNOSIS — R768 Other specified abnormal immunological findings in serum: Secondary | ICD-10-CM

## 2021-09-25 MED ORDER — TRAMADOL-ACETAMINOPHEN 37.5-325 MG PO TABS: 1.0000 | ORAL_TABLET | Freq: Four times a day (QID) | ORAL | 0 refills | Status: DC | PRN
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
## 2021-09-25 NOTE — Patient Instructions (Addendum)
Avoid bending, stooping and avoid lifting weights greater than 10 lbs. Avoid prolong standing and walking. Avoid frequent bending and stooping  No lifting greater than 10 lbs. May use ice or moist heat for pain. Weight loss is of benefit. Handicap license is approved. Dr. Romona Curls secretary/Assistant will call to arrange for left SI joint local anesthetic-steroid injection   Laboratory testing drawn, Sed rate, CRP, ANA, RF, Uric acid and BMET Tramdol for more severe pain. She does not desire surgery presently.

## 2021-09-25 NOTE — ED Provider Notes (Signed)
[facility] Provider Note   CSN: [id number] Arrival date & time: 09/25/21  1824     History  Chief Complaint  Patient presents with   Knee Pain    Kristen Woods is a 56 y.o. female with a past medical history significant for previous significant arthritis in bilateral knees with knee replacement on the left who presents with concern for right knee pain today that started after she need a plastic bucket.  Patient reports she has known arthritis in the right knee, may need replacement.  She denies new numbness, tingling.  She reports some feeling like it is getting give out when she tries to stand on it.   Knee Pain     Home Medications Prior to Admission medications   Medication Sig Start Date End Date Taking? Authorizing Provider  ascorbic acid (VITAMIN C) 500 MG tablet Take 500 mg by mouth daily.    [provider]  atorvastatin (LIPITOR) 20 MG tablet  08/23/14   [provider]  benzonatate (TESSALON) 100 MG capsule Take 1 capsule (100 mg total) by mouth every 8 (eight) hours. 03/26/19   Domenic Moras, PA-C  Biotin 10 MG CAPS Take 1 tablet by mouth daily.    [provider]  cyclobenzaprine (FLEXERIL) 10 MG tablet Take 1 tablet (10 mg total) by mouth 3 (three) times daily as needed for muscle spasms. 01/12/15   Sater, Nanine Means, MD  cyclobenzaprine (FLEXERIL) 10 MG tablet Take 1 tablet (10 mg total) by mouth 3 (three) times daily as needed for muscle spasms. 08/29/21   Trula Slade, DPM  diclofenac (VOLTAREN) 75 MG EC tablet Take 1 tablet (75 mg total) by mouth 2 (two) times daily as needed for moderate pain. 05/31/14   Domenic Moras, PA-C  gabapentin (NEURONTIN) 300 MG capsule One in am, one in evening and two at bedtime po 10/05/14   Sater, Nanine Means, MD  HYDROcodone-acetaminophen (NORCO/VICODIN) 5-325 MG per tablet Take 1 tablet by mouth every 6 (six) hours as needed for moderate pain or severe pain. 05/31/14   Domenic Moras,  PA-C  HYDROcodone-acetaminophen (NORCO/VICODIN) 5-325 MG tablet Take 1-2 tablets by mouth every 6 (six) hours as needed for moderate pain. 09/13/15   Fredia Sorrow, MD  lisinopril-hydrochlorothiazide (PRINZIDE,ZESTORETIC) 20-25 MG per tablet Take 1 tablet by mouth daily.  07/09/12   [provider]  meloxicam (MOBIC) 15 MG tablet Take 1 tablet (15 mg total) by mouth daily as needed for pain. 08/29/21 08/29/22  Trula Slade, DPM  methylPREDNISolone (MEDROL DOSEPAK) 4 MG TBPK tablet Take 6 tablets on day one, then decrease by one tablet each day until gone. 10/13/14   Sater, Nanine Means, MD  Multiple Vitamins-Minerals (ALIVE WOMENS 50+ PO) Take 1 tablet by mouth 2 (two) times daily.    [provider]  traMADol-acetaminophen (ULTRACET) 37.5-325 MG tablet Take 1 tablet by mouth every 6 (six) hours as needed. 09/25/21   Jessy Oto, MD      Allergies    Patient has no known allergies.    Review of Systems   Review of Systems  Musculoskeletal:  Positive for arthralgias.  All other systems reviewed and are negative.  Physical Exam Updated Vital Signs BP (!) 144/103 (BP Location: Right Arm)   Pulse (!) 103   Temp 98.2 F (36.8 C) (Oral)   Resp 20   Ht '5\' 6"'$  (1.676 m)   Wt 127 kg   LMP 01/04/2015   SpO2 97%  BMI 45.19 kg/m  Physical Exam Vitals and nursing note reviewed.  Constitutional:      General: She is not in acute distress.    Appearance: Normal appearance.  HENT:     Head: Normocephalic and atraumatic.  Eyes:     General:        Right eye: No discharge.        Left eye: No discharge.  Cardiovascular:     Rate and Rhythm: Normal rate and regular rhythm.  Pulmonary:     Effort: Pulmonary effort is normal. No respiratory distress.  Musculoskeletal:        General: No deformity.     Comments: Skin tenderness palpation of the right knee especially in the medial aspect without significant effusion.  No varus, valgus, anterior posterior drawer laxity.   Intact range of motion, intact strength of the affected extremity.  Skin:    General: Skin is warm and dry.  Neurological:     Mental Status: She is alert and oriented to person, place, and time.  Psychiatric:        Mood and Affect: Mood normal.        Behavior: Behavior normal.    ED Results / Procedures / Treatments   Labs (all labs ordered are listed, but only abnormal results are displayed) Labs Reviewed - No data to display  EKG None  Radiology DG Knee Complete 4 Views Right  Result Date: 09/25/2021 CLINICAL DATA:  Right knee pain, no known injury, initial encounter EXAM: RIGHT KNEE - COMPLETE 4+ VIEW COMPARISON:  None Available. FINDINGS: Degenerative changes are noted within the patellofemoral and medial joint space. No joint effusion is seen. No acute fracture or dislocation is noted. No soft tissue abnormality is noted. IMPRESSION: Degenerative change particularly in the medial joint space. No acute abnormality noted. Electronically Signed   By: Inez Catalina M.D.   On: 09/25/2021 19:21   XR Lumbar Spine 2-3 Views  Result Date: 09/25/2021 Hyperlordosis 73 degree lordotic curve L1-S1, mild DDD narrowing L4-5 with grade one anterolisthesis L4-5 that increases from 4 mm to 7 mm with flexion and extension.    Procedures Procedures    Medications Ordered in ED Medications - No data to display  ED Course/ Medical Decision Making/ A&P                           Medical Decision Making Amount and/or Complexity of Data Reviewed Radiology: ordered.  This is an overall well-appearing 56 year old female with some chronic arthritis issues who presents with concern for acute onset right knee pain in the context of chronic osteoarthritis and need for knee replacement.  She is neurovascularly intact on my exam. I independently interpreted imaging including plain film radiographs of the right knee which shows medial joint space narrowing without any acute fracture, dislocation. I agree  with the radiologist interpretation.   Patient's pain is consistent with an arthritis exacerbation.  We discussed anti-inflammatory medication, muscle relaxant, ice, knee brace, however discussed that I would recommend patient have an intra-articular injection as this would give her the most targeted relief.  Discussed we could consider a systemic steroid but I would not particularly recommend it as patient has hypertension, obesity at baseline, and this could worsen her blood pressure temporarily as well as other steroid related side effects, systemic basis.  Patient drove to the emergency department today so I cannot offer her any narcotic pain medication, she declined any nonnarcotic  pain medication.  Encourage close follow-up with her orthopedic doctor we will provide her with a knee brace in the meantime. Final Clinical Impression(s) / ED Diagnoses Final diagnoses:  Acute pain of right knee    Rx / DC Orders ED Discharge Orders     None         Dorien Chihuahua 09/25/21 2124    Varney Biles, MD 10/03/21 [phone]

## 2021-09-25 NOTE — ED Triage Notes (Signed)
Pt c/o RT knee pain and sts "it almost went out on me today"

## 2021-09-25 NOTE — Discharge Instructions (Signed)
Please use Tylenol or ibuprofen for pain.  You may use 600 mg ibuprofen every 6 hours or 1000 mg of Tylenol every 6 hours.  You may choose to alternate between the 2.  This would be most effective.  Not to exceed 4 g of Tylenol within 24 hours.  Not to exceed 3200 mg ibuprofen 24 hours.  Recommend rest, ice, compression, elevation of the affected extremity.  I recommend close follow-up with your orthopedic doctor for further evaluation and possible intra-articular injection, as well as discussion of joint replacement.  Please return if your pain significantly worsens or fails to improve despite treatment.

## 2021-09-26 ENCOUNTER — Other Ambulatory Visit: Payer: Self-pay | Admitting: Specialist

## 2021-09-26 ENCOUNTER — Ambulatory Visit: Payer: 59 | Admitting: Physical Therapy

## 2021-09-26 ENCOUNTER — Telehealth: Payer: Self-pay | Admitting: Specialist

## 2021-09-26 LAB — C-REACTIVE PROTEIN: CRP: 16.4 mg/L — ABNORMAL HIGH (ref <8.0)

## 2021-09-26 LAB — BASIC METABOLIC PANEL
BUN/Creatinine Ratio: 15 (calc) (ref 6–22)
BUN: 16 mg/dL (ref 7–25)
CO2: 30 mmol/L (ref 20–32)
Calcium: 9.9 mg/dL (ref 8.6–10.4)
Chloride: 103 mmol/L (ref 98–110)
Creat: 1.04 mg/dL — ABNORMAL HIGH (ref 0.50–1.03)
Glucose, Bld: 86 mg/dL (ref 65–99)
Potassium: 4.4 mmol/L (ref 3.5–5.3)
Sodium: 141 mmol/L (ref 135–146)

## 2021-09-26 LAB — URIC ACID: Uric Acid, Serum: 7.4 mg/dL — ABNORMAL HIGH (ref 2.5–7.0)

## 2021-09-26 LAB — SEDIMENTATION RATE: Sed Rate: 38 mm/h — ABNORMAL HIGH (ref 0–30)

## 2021-09-26 LAB — RHEUMATOID FACTOR: Rhuematoid fact SerPl-aCnc: <14 IU/mL (ref <14)

## 2021-09-26 MED ORDER — COLCHICINE 0.6 MG PO TABS: 0.6000 mg | ORAL_TABLET | Freq: Two times a day (BID) | ORAL | 0 refills | Status: DC

## 2021-09-26 MED ORDER — ALLOPURINOL 100 MG PO TABS: 100.0000 mg | ORAL_TABLET | Freq: Two times a day (BID) | ORAL | 3 refills | Status: DC

## 2021-09-26 NOTE — Telephone Encounter (Signed)
This is a [facility] pt. She went to the ER last night and calling with concerns.

## 2021-09-26 NOTE — Telephone Encounter (Signed)
Pt called and states she had to go to the ER last night about her knee pain. She is wanting Nitka to take a look at her stuff and give her a call. She is freaking out and thinks her knee is all "jacked up."   OV [phone]

## 2021-09-27 ENCOUNTER — Telehealth: Payer: Self-pay | Admitting: Specialist

## 2021-09-27 LAB — SPECIMEN STATUS

## 2021-09-27 LAB — ANA

## 2021-09-27 NOTE — Telephone Encounter (Signed)
Dr. Louanne Skye called patient

## 2021-09-27 NOTE — Telephone Encounter (Signed)
[employer] called. Lisabeth Pick has a question about the extra blood sample that was sent in. Her call back number is [phone] opt #1 and [phone]

## 2021-09-29 LAB — SPECIMEN STATUS REPORT

## 2021-09-29 LAB — ANA

## 2021-09-30 ENCOUNTER — Ambulatory Visit: Payer: 59

## 2021-09-30 NOTE — Telephone Encounter (Signed)
Two serum and two purple tops were sent to ensure that there was sufficient blood for the exams. Nothing else was needed.

## 2021-10-01 LAB — ENA+DNA/DS+ANTICH+CENTRO+FA...
Anti JO-1: <0.2 AI (ref 0.0–0.9)
Centromere Ab Screen: <0.2 AI (ref 0.0–0.9)
Chromatin Ab SerPl-aCnc: <0.2 AI (ref 0.0–0.9)
ENA RNP Ab: 0.6 AI (ref 0.0–0.9)
ENA SM Ab Ser-aCnc: <0.2 AI (ref 0.0–0.9)
ENA SSA (RO) Ab: 2.2 AI — ABNORMAL HIGH (ref 0.0–0.9)
ENA SSB (LA) Ab: <0.2 AI (ref 0.0–0.9)
Scleroderma (Scl-70) (ENA) Antibody, IgG: <0.2 AI (ref 0.0–0.9)
Speckled Pattern: 1:80 {titer}
dsDNA Ab: <1 IU/mL (ref 0–9)

## 2021-10-03 ENCOUNTER — Ambulatory Visit: Payer: 59 | Admitting: Physical Therapy

## 2021-10-07 DIAGNOSIS — G4733 Obstructive sleep apnea (adult) (pediatric): Secondary | ICD-10-CM | POA: Diagnosis not present

## 2021-10-10 ENCOUNTER — Encounter: Payer: 59 | Admitting: Physical Therapy

## 2021-10-10 ENCOUNTER — Encounter: Payer: Self-pay | Admitting: Specialist

## 2021-10-14 ENCOUNTER — Ambulatory Visit: Payer: 59

## 2021-10-14 ENCOUNTER — Ambulatory Visit: Payer: 59 | Admitting: Podiatry

## 2021-10-14 DIAGNOSIS — M7752 Other enthesopathy of left foot: Secondary | ICD-10-CM

## 2021-10-14 DIAGNOSIS — M722 Plantar fascial fibromatosis: Secondary | ICD-10-CM

## 2021-10-16 ENCOUNTER — Ambulatory Visit: Payer: 59 | Admitting: Specialist

## 2021-10-16 ENCOUNTER — Encounter: Payer: Self-pay | Admitting: Specialist

## 2021-10-16 DIAGNOSIS — R768 Other specified abnormal immunological findings in serum: Secondary | ICD-10-CM | POA: Diagnosis not present

## 2021-10-16 DIAGNOSIS — M533 Sacrococcygeal disorders, not elsewhere classified: Secondary | ICD-10-CM

## 2021-10-16 DIAGNOSIS — G8929 Other chronic pain: Secondary | ICD-10-CM | POA: Diagnosis not present

## 2021-10-16 DIAGNOSIS — M10479 Other secondary gout, unspecified ankle and foot: Secondary | ICD-10-CM

## 2021-10-16 DIAGNOSIS — M4316 Spondylolisthesis, lumbar region: Secondary | ICD-10-CM | POA: Diagnosis not present

## 2021-10-16 DIAGNOSIS — M545 Low back pain, unspecified: Secondary | ICD-10-CM

## 2021-10-16 DIAGNOSIS — M5136 Other intervertebral disc degeneration, lumbar region: Secondary | ICD-10-CM

## 2021-10-16 DIAGNOSIS — M47817 Spondylosis without myelopathy or radiculopathy, lumbosacral region: Secondary | ICD-10-CM

## 2021-10-16 MED ORDER — METHYLPREDNISOLONE 4 MG PO TBPK: ORAL_TABLET | ORAL | 0 refills | Status: DC

## 2021-10-16 MED ORDER — COLCHICINE 0.6 MG PO TABS: 0.6000 mg | ORAL_TABLET | Freq: Two times a day (BID) | ORAL | 0 refills | Status: DC

## 2021-10-16 NOTE — Progress Notes (Addendum)
Office Visit Note   Patient: Kristen Woods           Date of Birth: 1965-08-11           MRN: [id number] Visit Date: 10/16/2021              Requested by: No referring provider defined for this encounter. PCP: Patient, No Pcp Per   Assessment & Plan: Visit Diagnoses:  1. Spondylolisthesis, lumbar region   2. Degenerative disc disease, lumbar   3. Spondylosis without myelopathy or radiculopathy, lumbosacral region   4. Chronic midline low back pain without sciatica   5. Positive ANA (antinuclear antibody)   6. 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 Avoid prolong standing and walking. Avoid frequent bending and stooping  No lifting greater than 10 lbs. May use ice or moist heat for pain. Weight loss is of benefit. Handicap license is approved. Dr. Romona Curls secretary/Assistant will call to arrange for SI steroid injection  Avoid bending, stooping and avoid lifting weights greater than 10 lbs. Avoid prolong standing and walking. Avoid frequent bending and stooping  No lifting greater than 10 lbs. May use ice or moist heat for pain. Weight loss is of benefit. Handicap license is approved. Referral to Rheumatology Will request that SI joint injection be considered. Start medrol dose pak, do not take NSAIDs with steroid in a medrol dose pak. Colcrys 0.6 mg BID  if diarrhea decrease to once a day for one week.   Follow-Up Instructions: Return in about 4 weeks (around 11/13/2021).   Orders:  No orders of the defined types were placed in this encounter.  No orders of the defined types were placed in this encounter.     Procedures: No procedures performed   Clinical Data: Findings:  Narrative & Impression CLINICAL DATA:  Acute persistent LEFT leg pain beginning at 3 p.m. today. Acute onset while walking at work. Subjective LEFT  leg weakness and heaviness, numbness. History of LEFT knee pain after fall 2013.  EXAM: MRI LUMBAR SPINE WITHOUT CONTRAST  TECHNIQUE: Multiplanar, multisequence MR imaging of the lumbar spine was performed. No intravenous contrast was administered.  COMPARISON:  Lumbar spine radiographs May 31, 2014 at 1731 hours  FINDINGS: Lumbar vertebral bodies and posterior elements appear intact and aligned with maintenance of lumbar lordosis. Intervertebral discs demonstrate normal morphology, slight desiccation the lower lumbar discs. Minimal subacute to chronic discogenic endplate change at Y8-6, L5-S1 (using the reference level of the last well-formed intervertebral disc as L5-S1). No STIR signal abnormality to suggest acute osseous process. Scattered hemangiomata. Partially imaged LEFT greater than RIGHT low T1 and low T2 signal about the sacroiliac joints.  Conus medullaris terminates at L1-2 and appears normal in morphology and signal characteristics. Cauda equina is unremarkable. Included prevertebral and paraspinal soft tissues are nonsuspicious.  Level by level evaluation:  L1-2, L2-3, L3-4: No disc bulge, canal stenosis nor neural foraminal narrowing.  L4-5: 2 mm broad-based disc bulge, mild facet arthropathy and ligamentum flavum redundancy without canal stenosis. Mild bilateral neural foraminal narrowing.  L5-S1: 2 mm broad-based disc bulge. Moderate facet arthropathy without canal stenosis. Minimal bilateral neural foraminal narrowing.  IMPRESSION: No acute fracture or malalignment.  Mild degenerative change of lumbar spine without canal stenosis. Minimal to mild L4-5 and L5-S1 neural foraminal narrowing.  LEFT greater than RIGHT partially imaged low signal about this sacroiliac joints  which could reflect degenerative change or chronic sacroiliitis.   Electronically Signed   By: Elon Alas   On: 05/31/2014  22:46  Narrative *RADIOLOGY REPORT*   Clinical Data: Left hip pain since a fall 1 month ago.   LEFT HIP - COMPLETE 2+ VIEW   Comparison: None.   Findings: Osseous structures of the left hip are normal. No joint  space narrowing or osteophyte formation.   The patient has sclerosis at the sacroiliac joints and symphysis  pubis, probably related childbirth.   IMPRESSION:  Normal left hip.   Original Report Authenticated By: Larey Seat, M.D.  MyChart Results Release  MyChart Status: Active Results Release      Subjective: Chief Complaint  Patient presents with  . Lower Back - Follow-up    56 year old female seen today with result of laboratory test, positive ANA and elevated urica acid. She has continued left buttock and hip pain. Last seen and an SI joint block recommended however her request reviewed due to the lack of 3 test positive per the reviewer.  Pain with standing and walking but transition from sitting to standing in particular.   Review of Systems  Constitutional: Negative.   HENT: Negative.    Eyes: Negative.   Respiratory: Negative.    Cardiovascular: Negative.   Gastrointestinal: Negative.   Endocrine: Negative.   Genitourinary: Negative.   Musculoskeletal: Negative.   Skin: Negative.   Allergic/Immunologic: Negative.   Neurological: Negative.   Hematological: Negative.   Psychiatric/Behavioral: Negative.      Objective: Vital Signs: BP (!) 137/93 (BP Location: Left Arm, Patient Position: Sitting)   Pulse 75   Ht '5\' 6"'$  (1.676 m)   Wt 296 lb (134.3 kg)   LMP 01/04/2015   BMI 47.78 kg/m   Physical Exam Constitutional:      Appearance: She is well-developed.  HENT:     Head: Normocephalic and atraumatic.  Eyes:     Pupils: Pupils are equal, round, and reactive to light.  Pulmonary:     Effort: Pulmonary effort is normal.     Breath sounds: Normal breath sounds.  Abdominal:     General: Bowel sounds are normal.      Palpations: Abdomen is soft.  Musculoskeletal:     Cervical back: Normal range of motion and neck supple.     Lumbar back: Negative right straight leg raise test and negative left straight leg raise test.  Skin:    General: Skin is warm and dry.  Neurological:     Mental Status: She is alert and oriented to person, place, and time.  Psychiatric:        Behavior: Behavior normal.        Thought Content: Thought content normal.        Judgment: Judgment normal.   Back Exam   Range of Motion  Extension:  abnormal  Flexion:  abnormal  Lateral bend right:  abnormal  Lateral bend left:  abnormal  Rotation right:  abnormal  Rotation left:  abnormal   Muscle Strength  Right Quadriceps:  5/5  Left Quadriceps:  5/5  Right Hamstrings:  5/5  Left Hamstrings:  5/5   Tests  Straight leg raise right: negative Straight leg raise left: negative  Reflexes  Patellar:  2/4 Achilles:  2/4  Comments:  Tender left SI joint to palpation and compression testing Severe pain with figure of four stressing of left SI joint Gaenslen's test is positive SLR is negative Yeoman's test  is positive for replication of pain.    Specialty Comments:  No specialty comments available.  Imaging: No results found.   PMFS History: Patient Active Problem List   Diagnosis Date Noted  . Lower back pain 01/12/2015  . Neck pain 10/05/2014  . Left arm pain 10/05/2014  . Dysesthesia 10/05/2014  . Shoulder pain, left 10/05/2014  . Left knee pain 08/02/2012  . Right wrist pain 02/09/2012  . Left leg pain 11/17/2011   Past Medical History:  Diagnosis Date  . Hypertension   . Leg pain   . Liver disease   . Nerves   . Vision abnormalities     Family History  Problem Relation Age of Onset  . Hypertension Mother   . Diabetes Father   . Hyperlipidemia Other   . Heart attack Neg Hx   . Sudden death Neg Hx     Past Surgical History:  Procedure Laterality Date  . BREAST SURGERY    . INCONTINENCE  SURGERY    . KNEE SURGERY     Social History   Occupational History  . Not on file  Tobacco Use  . Smoking status: Never  . Smokeless tobacco: Not on file  Substance and Sexual Activity  . Alcohol use: No  . Drug use: No  . Sexual activity: Not on file

## 2021-10-16 NOTE — Patient Instructions (Addendum)
Plan: Avoid bending, stooping and avoid lifting weights greater than 10 lbs. Avoid prolong standing and walking. Avoid frequent bending and stooping  No lifting greater than 10 lbs. May use ice or moist heat for pain. Weight loss is of benefit. Handicap license is approved. Dr. Romona Curls secretary/Assistant will call to arrange for SI steroid injection  Avoid bending, stooping and avoid lifting weights greater than 10 lbs. Avoid prolong standing and walking. Avoid frequent bending and stooping  No lifting greater than 10 lbs. May use ice or moist heat for pain. Weight loss is of benefit. Handicap license is approved. Referral to Rheumatology Will request that SI joint injection be considered as test done and documented today suggest SI pathology.  Start medrol dose pak, do not take NSAIDs with steroid in a medrol dose pak. Colcrys 0.6 mg BID  if diarrhea decrease to once a day for one week.    Follow-Up Instructions: Return in about 4 weeks (around 11/13/2021).

## 2021-10-17 ENCOUNTER — Encounter: Payer: 59 | Admitting: Physical Therapy

## 2021-10-18 ENCOUNTER — Other Ambulatory Visit: Payer: Self-pay | Admitting: Specialist

## 2021-10-18 ENCOUNTER — Other Ambulatory Visit: Payer: Self-pay | Admitting: Radiology

## 2021-10-18 MED ORDER — TRAMADOL-ACETAMINOPHEN 37.5-325 MG PO TABS
1.0000 | ORAL_TABLET | Freq: Four times a day (QID) | ORAL | 0 refills | Status: DC | PRN
Start: 2021-10-18 — End: 2022-03-19

## 2021-10-18 NOTE — Addendum Note (Signed)
Addended by: Basil Dess on: 10/18/2021 03:52 PM   Modules accepted: Orders

## 2021-10-20 NOTE — Progress Notes (Signed)
Subjective: 56 year old female presents the office today for evaluation of left heel pain, plantar fasciitis.  She did go to physical therapy but apparently she was told by another doctor to stop doing physical therapy given other issues.  Injection helped for only a couple hours.  Inserts has not been helping.  She is recent been diagnosed with lupus she states.  She been taking meloxicam as well as icing.  She has possible warts or calluses to the ball of her left foot as well as the heel.  No open lesions.  No recent injuries.  Objective: AAO x3, NAD DP/PT pulses palpable bilaterally, CRT less than 3 seconds There is still tenderness palpation on plantar medial tubercle of the calcaneus at the insertion of the plantar fascia on the left side.  Plantar fascia appears intact.  No significant pain along the Achilles tendon equinus is present.  Does get some tenderness in the ankle tendons mostly medially but clinically the tendons appear intact. No pain with calf compression, swelling, warmth, erythema  Assessment: 56 year old female with plantar fasciitis, tendinitis left foot, skin lesions  Plan: -All treatment options discussed with the patient including all alternatives, risks, complications.  -Should be debrided lesion without any complications bleeding.  Recommend moisturizer daily. -We discussed home rehab exercise that she can do specifically for the foot.  Dispensed Night splint to help stretch the plantar fascia, Achilles tendon.  Continue issues and good arch support.  Continue anti-inflammatories if not taking oral steroids.  Discussed steroid injection however on steroids for other issues. -Patient encouraged to call the office with any questions, concerns, change in symptoms.   Trula Slade DPM

## 2021-10-21 ENCOUNTER — Ambulatory Visit (INDEPENDENT_AMBULATORY_CARE_PROVIDER_SITE_OTHER): Payer: 59 | Admitting: Orthopedic Surgery

## 2021-10-21 DIAGNOSIS — M5136 Other intervertebral disc degeneration, lumbar region: Secondary | ICD-10-CM | POA: Diagnosis not present

## 2021-10-23 ENCOUNTER — Encounter: Payer: Self-pay | Admitting: Orthopedic Surgery

## 2021-10-23 NOTE — Progress Notes (Signed)
Office Visit Note   Patient: Kristen Woods           Date of Birth: 02/17/1966           MRN: [id number] Visit Date: 10/21/2021              Requested by: No referring provider defined for this encounter. PCP: Patient, No Pcp Per  Chief Complaint  Patient presents with   Left Achilles Tendon - Follow-up   Left Knee - Follow-up      HPI: Patient is a 56 year old woman who presents with persistent lower back pain left-sided radicular symptoms.  Patient states that Dr. Donavan Burnet advised her against doing physical therapy.  Patient states she is interested in epidural steroid injections for her lumbar spine.  She is currently on a 7-day course of prednisone patient states she has radicular pain from the left buttocks down the left leg and that her leg is dead.  Patient complains of weakness in the left lower extremity  Assessment & Plan: Visit Diagnoses:  1. Degenerative disc disease, lumbar     Plan: Patient will follow-up with Dr. Louanne Skye for her lumbar spine she is just started her CPAP and will complete her course of prednisone.  Awaiting authorization for epidural steroid injections.  Follow-Up Instructions: Return if symptoms worsen or fail to improve.   Ortho Exam  Patient is alert, oriented, no adenopathy, well-dressed, normal affect, normal respiratory effort. Examination patient has difficulty getting up from a seated position she does have an antalgic gait.  There is no pain with passive range of motion of the left knee collaterals and cruciates are stable she does have osteoarthritis of the right knee.  Patient has a negative straight leg raise on the left and no focal motor weakness.  She has a good dorsalis pedis pulse with good dorsiflexion of the ankle.  Imaging: No results found. No images are attached to the encounter.  Labs: Lab Results  Component Value Date   ESRSEDRATE 38 (H) 09/25/2021   CRP 16.4 (H) 09/25/2021   LABURIC 7.4 (H) 09/25/2021     Lab  Results  Component Value Date   ALBUMIN 3.5 10/31/2013    No results found for: "MG" No results found for: "VD25OH"  No results found for: "PREALBUMIN"    09/25/2021    1:15 PM 09/13/2015    5:34 PM 11/07/2014    9:10 PM  CBC EXTENDED  WBC WILL FOLLOW  P 8.7  9.2   RBC WILL FOLLOW  P 3.89  4.21   Hemoglobin WILL FOLLOW  P 10.8  11.8   HCT WILL FOLLOW  P 33.3  36.5   Platelets WILL FOLLOW  P 422  417   NEUT# WILL FOLLOW  P 5.8  4.3   Lymph# WILL FOLLOW  P 1.7  3.2     P Preliminary result     There is no height or weight on file to calculate BMI.  Orders:  No orders of the defined types were placed in this encounter.  No orders of the defined types were placed in this encounter.    Procedures: No procedures performed  Clinical Data: No additional findings.  ROS:  All other systems negative, except as noted in the HPI. Review of Systems  Objective: Vital Signs: LMP 01/04/2015   Specialty Comments:  No specialty comments available.  PMFS History: Patient Active Problem List   Diagnosis Date Noted   Lower back pain 01/12/2015   Neck pain  10/05/2014   Left arm pain 10/05/2014   Dysesthesia 10/05/2014   Shoulder pain, left 10/05/2014   Left knee pain 08/02/2012   Right wrist pain 02/09/2012   Left leg pain 11/17/2011   Past Medical History:  Diagnosis Date   Hypertension    Leg pain    Liver disease    Nerves    Vision abnormalities     Family History  Problem Relation Age of Onset   Hypertension Mother    Diabetes Father    Hyperlipidemia Other    Heart attack Neg Hx    Sudden death Neg Hx     Past Surgical History:  Procedure Laterality Date   BREAST SURGERY     INCONTINENCE SURGERY     KNEE SURGERY     Social History   Occupational History   Not on file  Tobacco Use   Smoking status: Never   Smokeless tobacco: Not on file  Substance and Sexual Activity   Alcohol use: No   Drug use: No   Sexual activity: Not on file

## 2021-10-31 ENCOUNTER — Encounter: Payer: 59 | Admitting: Physical Therapy

## 2021-11-06 DIAGNOSIS — G4733 Obstructive sleep apnea (adult) (pediatric): Secondary | ICD-10-CM | POA: Diagnosis not present

## 2021-11-07 ENCOUNTER — Encounter: Payer: 59 | Admitting: Physical Therapy

## 2021-11-15 ENCOUNTER — Encounter: Payer: Self-pay | Admitting: Specialist

## 2021-11-15 ENCOUNTER — Ambulatory Visit: Payer: 59 | Admitting: Specialist

## 2021-11-15 VITALS — BP 148/93 | HR 103 | Ht 66.0 in | Wt 296.0 lb

## 2021-11-15 DIAGNOSIS — M4316 Spondylolisthesis, lumbar region: Secondary | ICD-10-CM

## 2021-11-15 DIAGNOSIS — M5136 Other intervertebral disc degeneration, lumbar region: Secondary | ICD-10-CM | POA: Diagnosis not present

## 2021-11-15 DIAGNOSIS — M533 Sacrococcygeal disorders, not elsewhere classified: Secondary | ICD-10-CM | POA: Diagnosis not present

## 2021-11-15 DIAGNOSIS — Z791 Long term (current) use of non-steroidal anti-inflammatories (NSAID): Secondary | ICD-10-CM

## 2021-11-15 DIAGNOSIS — M10479 Other secondary gout, unspecified ankle and foot: Secondary | ICD-10-CM | POA: Diagnosis not present

## 2021-11-15 DIAGNOSIS — M4726 Other spondylosis with radiculopathy, lumbar region: Secondary | ICD-10-CM

## 2021-11-15 MED ORDER — IBUPROFEN 800 MG PO TABS: 800.0000 mg | ORAL_TABLET | Freq: Three times a day (TID) | ORAL | 4 refills | Status: DC | PRN

## 2021-11-15 NOTE — Patient Instructions (Addendum)
Avoid bending, stooping and avoid lifting weights greater than 10 lbs. Avoid prolong standing and walking. Avoid frequent bending and stooping  No lifting greater than 10 lbs. May use ice or moist heat for pain. Weight loss is of benefit. Handicap license is approved. Dr. Romona Curls secretary/Assistant will call to arrange for epidural steroid injection    Ice the knee that is suffering from osteoarthritis, only real proven treatments are Weight loss, NSIADs like diclofenac , motrin or naprosyn and exercise. Well padded shoes help. Ice the knee 2-3 times a day 15-20 mins at a time.-3 times a day 15-20 mins at a time. Hot showers in the AM.  Injection with steroid may be of benefit. Hemp CBD capsules, amazon.com 5,000-7,000 mg per bottle, 60 capsules per bottle, take one capsule twice a day. Cane in the left hand to use with left leg weight bearing. Follow-Up Instructions: No follow-ups on file.

## 2021-11-15 NOTE — Progress Notes (Signed)
Office Visit Note   Patient: Kristen Woods           Date of Birth: [birth year]           MRN: [id number] Visit Date: 11/15/2021              Requested by: No referring provider defined for this encounter. PCP: Patient, No Pcp Per   Assessment & Plan: Visit Diagnoses:  1. Degenerative disc disease, lumbar   2. Spondylolisthesis, lumbar region   3. Sacroiliac joint dysfunction of left side   4. Other spondylosis with radiculopathy, lumbar region   5. NSAID long-term use   6. 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 Avoid prolong standing and walking. Avoid frequent bending and stooping  No lifting greater than 10 lbs. May use ice or moist heat for pain. Weight loss is of benefit. Handicap license is approved. Dr. Romona Curls secretary/Assistant will call to arrange for epidural steroid injection    Ice the knee that is suffering from osteoarthritis, only real proven treatments are Weight loss, NSIADs like diclofenac , motrin or naprosyn and exercise. Well padded shoes help. Ice the knee 2-3 times a day 15-20 mins at a time.-3 times a day 15-20 mins at a time. Hot showers in the AM.  Injection with steroid may be of benefit. Hemp CBD capsules, amazon[employer]com 5,000-7,000 mg per bottle, 60 capsules per bottle, take one capsule twice a day. Cane in the left hand to use with left leg weight bearing. Follow-Up Instructions: No follow-ups on file.   Follow-Up Instructions: Return in about 4 weeks (around 12/13/2021) for please make an appointment with Dr. Ninfa Linden for bilateral knee pain, left TKR, OA right.   Orders:  No orders of the defined types were placed in this encounter.  No orders of the defined types were placed in this encounter.     Procedures: No procedures performed   Clinical Data: No additional  findings.   Subjective: Chief Complaint  Patient presents with   Lower Back - Follow-up    56 year old female post left [place]KR [place] 6-7 years ago in [place]. She experiences pain in the left pain with stair climbing and with bending and squatting.Has lumbar ddd with intermittant sciatica. Has difficulty with prolong standing and walking.    Review of Systems  Constitutional: Negative.   HENT: Negative.    Eyes: Negative.   Respiratory: Negative.    Cardiovascular: Negative.   Gastrointestinal: Negative.   Endocrine: Negative.   Genitourinary: Negative.   Musculoskeletal: Negative.   Skin: Negative.   Allergic/Immunologic: Negative.   Neurological: Negative.   Hematological: Negative.   Psychiatric/Behavioral: Negative.       Objective: Vital Signs: BP (!) 148/93 (BP Location: Left Arm, Patient Position: Sitting, Cuff Size: Large)   Pulse (!) 103   Ht '5\' 6"'$  (1.676 m)   Wt 296 lb (134.3 kg)   LMP 01/04/2015   BMI 47.78 kg/m   Physical Exam Constitutional:      Appearance: She is well-developed.  HENT:     Head: Normocephalic and atraumatic.  Eyes:     Pupils: Pupils are equal, round, and reactive to light.  Pulmonary:     Effort: Pulmonary effort is normal.     Breath sounds: Normal breath sounds.  Abdominal:     General: Bowel sounds are normal.  Palpations: Abdomen is soft.  Musculoskeletal:     Cervical back: Normal range of motion and neck supple.     Lumbar back: Negative right straight leg raise test and negative left straight leg raise test.  Skin:    General: Skin is warm and dry.  Neurological:     Mental Status: She is alert and oriented to person, place, and time.  Psychiatric:        Behavior: Behavior normal.        Thought Content: Thought content normal.        Judgment: Judgment normal.    Back Exam   Tenderness  The patient is experiencing tenderness in the lumbar.  Range of Motion  Extension:  abnormal  Flexion:   abnormal  Lateral bend right:  abnormal  Lateral bend left:  abnormal  Rotation right:  abnormal  Rotation left:  abnormal   Muscle Strength  Right Quadriceps:  5/5  Left Quadriceps:  5/5  Right Hamstrings:  5/5  Left Hamstrings:  5/5   Tests  Straight leg raise right: negative Straight leg raise left: negative  Other  Toe walk: normal Heel walk: normal Sensation: normal     Specialty Comments:  No specialty comments available.  Imaging: No results found.   PMFS History: Patient Active Problem List   Diagnosis Date Noted   Lower back pain 01/12/2015   Neck pain 10/05/2014   Left arm pain 10/05/2014   Dysesthesia 10/05/2014   Shoulder pain, left 10/05/2014   Left knee pain 08/02/2012   Right wrist pain 02/09/2012   Left leg pain 11/17/2011   Past Medical History:  Diagnosis Date   Hypertension    Leg pain    Liver disease    Nerves    Vision abnormalities     Family History  Problem Relation Age of Onset   Hypertension Mother    Diabetes Father    Hyperlipidemia Other    Heart attack Neg Hx    Sudden death Neg Hx     Past Surgical History:  Procedure Laterality Date   BREAST SURGERY     INCONTINENCE SURGERY     KNEE SURGERY     Social History   Occupational History   Not on file  Tobacco Use   Smoking status: Never   Smokeless tobacco: Not on file  Substance and Sexual Activity   Alcohol use: No   Drug use: No   Sexual activity: Not on file

## 2021-11-15 NOTE — Addendum Note (Signed)
Addended by: Basil Dess on: 11/15/2021 10:32 AM   Modules accepted: Orders

## 2021-11-20 ENCOUNTER — Ambulatory Visit: Payer: Self-pay

## 2021-11-20 ENCOUNTER — Ambulatory Visit (INDEPENDENT_AMBULATORY_CARE_PROVIDER_SITE_OTHER): Payer: 59 | Admitting: Physical Medicine and Rehabilitation

## 2021-11-20 VITALS — BP 161/100 | HR 82 | Ht 66.0 in | Wt 296.0 lb

## 2021-11-20 DIAGNOSIS — M461 Sacroiliitis, not elsewhere classified: Secondary | ICD-10-CM

## 2021-11-20 NOTE — Progress Notes (Signed)
Numeric Pain Rating Scale and Functional Assessment Average Pain 9  Having LBP with left leg radiculopathy.  Left BUTTOCK aches, In the last MONTH (on 0-10 scale) has pain interfered with the following?  1. General activity like being  able to carry out your everyday physical activities such as walking, climbing stairs, carrying groceries, or moving a chair?  Rating(9)   +Driver, -BT, -Dye Allergies.

## 2021-11-20 NOTE — Progress Notes (Signed)
Kristen Woods - 56 y.o. female MRN [id number]  Date of birth: October 08, 1965  Office Visit Note: Visit Date: 11/20/2021 PCP: Patient, No Pcp Per Referred by: Jessy Oto, MD  Subjective: Chief Complaint  Patient presents with   Lower Back - Pain   HPI:  Latiana Tomei is a 56 y.o. female who comes in today at the request of Dr. Basil Dess for planned Left anesthetic Sacroiliac joint arthrogram with fluoroscopic guidance.  The patient has failed conservative care including home exercise, medications, time and activity modification.  This injection will be diagnostic and hopefully therapeutic.  Please see requesting physician notes for further details and justification.   Positive Fortin finger sign, Patrick's testing, and lateral compression test.    ROS Otherwise per HPI.  Assessment & Plan: Visit Diagnoses:    ICD-10-CM   1. Sacroiliitis (HCC)  M46.1 Left Sacroiliac Joint Inj    XR C-ARM NO REPORT      Plan: No additional findings.   Meds & Orders: No orders of the defined types were placed in this encounter.   Orders Placed This Encounter  Procedures   Left Sacroiliac Joint Inj   XR C-ARM NO REPORT    Follow-up: Return for visit to requesting provider as needed.   Procedures: Left Sacroiliac Joint Inj on 11/20/2021 2:14 PM Indications: pain and diagnostic evaluation Details: 22 G 3.5 in needle, fluoroscopy-guided posterior approach Medications: 2 mL bupivacaine 0.5 %; 80 mg methylPREDNISolone acetate 80 MG/ML Outcome: tolerated well, no immediate complications  Sacroiliac Joint Intra-Articular Injection - Posterior Approach with Fluoroscopic Guidance   Position: PRONE  Additional Comments: Vital signs were monitored before and after the procedure. Patient was prepped and draped in the usual sterile fashion. The correct patient, procedure, and site was verified.   Injection Procedure Details:   Location/Site:  Sacroiliac joint  Needle size: 3.5 in  Spinal Needle  Needle type: Spinal  Needle Placement: Intra-articular  Findings:  -Comments: There was excellent flow of contrast producing a partial arthrogram of the sacroiliac joint.   Procedure Details: Starting with a 90 degree vertical and midline orientation the fluoroscope was tilted cranially 20 to 25 degrees and the target area of the inferior most part of the SI joint on the side mentioned above was visualized.  The soft tissues overlying this target were infiltrated with 4 ml. of 1% Lidocaine without Epinephrine. A #22 gauge spinal needle was inserted perpendicular to the fluoroscope table and advanced into the posterior inferior joint space using fluoroscopic guidance.  Position in the joint space was confirmed by obtaining a partial arthrogram using a 2 ml. volume of Isovue-250 contrast agent. After negative aspirate for gross pus or blood, the injectate was delivered to the joint. Radiographs were obtained for documentation purposes.   Additional Comments:   Dressing: Bandaid    Post-procedure details: Patient was observed during the procedure. Post-procedure instructions were reviewed.  Patient left the clinic in stable condition.    There was excellent flow of contrast producing a partial arthrogram of the sacroiliac joint.  Procedure, treatment alternatives, risks and benefits explained, specific risks discussed. Consent was given by the patient. Immediately prior to procedure a time out was called to verify the correct patient, procedure, equipment, support staff and site/side marked as required. Patient was prepped and draped in the usual sterile fashion.          Clinical History: No specialty comments available.     Objective:  VS:  HT:'5\' 6"'$  (167.6 cm)  WT:296 lb (134.3 kg)  BMI:47.8    BP:(!) 161/100  HR:82bpm  TEMP: ( )  RESP:  Physical Exam   Imaging: No results found.

## 2021-11-25 ENCOUNTER — Encounter: Payer: Self-pay | Admitting: Physical Medicine and Rehabilitation

## 2021-11-25 MED ORDER — BUPIVACAINE HCL 0.5 % IJ SOLN
2.0000 mL | INTRAMUSCULAR | Status: AC | PRN
  Administered 2021-11-20: 2 mL via INTRA_ARTICULAR

## 2021-11-25 MED ORDER — METHYLPREDNISOLONE ACETATE 80 MG/ML IJ SUSP
80.0000 mg | INTRAMUSCULAR | Status: AC | PRN
  Administered 2021-11-20: 80 mg via INTRA_ARTICULAR

## 2021-12-03 ENCOUNTER — Telehealth: Payer: Self-pay | Admitting: Orthopaedic Surgery

## 2021-12-03 ENCOUNTER — Other Ambulatory Visit: Payer: Self-pay | Admitting: Orthopaedic Surgery

## 2021-12-03 MED ORDER — HYDROCODONE-ACETAMINOPHEN 5-325 MG PO TABS: 1.0000 | ORAL_TABLET | Freq: Four times a day (QID) | ORAL | 0 refills | Status: DC | PRN

## 2021-12-03 NOTE — Progress Notes (Signed)
norco number 20 tablets in and 1 p.o. every 6 she understands she cannot drive while she is taking them.  Her symptoms sounds like she may be having some trochanteric bursitis and if she does not have the settle down she can return to be seen.  I called patient since Dr. Louanne Skye is out of the office.

## 2021-12-03 NOTE — Telephone Encounter (Signed)
Please see below messages. Patient had SI joint injection with Dr. Ernestina Patches that was ordered by Dr. Louanne Skye. She states that she is still experiencing severe pain, and My Chart message today states that she is having to leave work due to the pain. Can you advise since Dr. Louanne Skye is out of the office?

## 2021-12-03 NOTE — Telephone Encounter (Signed)
Can you please advise since Dr. Louanne Skye is not in office

## 2021-12-03 NOTE — Telephone Encounter (Signed)
Do you mind asking Dr. Lorin Mercy?

## 2021-12-03 NOTE — Telephone Encounter (Signed)
Pt called requesting a call back. Pt states after injection she still experiencing severe pain. Please call pt about this matter at [phone].

## 2021-12-07 DIAGNOSIS — G4733 Obstructive sleep apnea (adult) (pediatric): Secondary | ICD-10-CM | POA: Diagnosis not present

## 2021-12-13 ENCOUNTER — Other Ambulatory Visit: Payer: Self-pay | Admitting: Orthopaedic Surgery

## 2021-12-16 ENCOUNTER — Other Ambulatory Visit: Payer: Self-pay

## 2021-12-16 ENCOUNTER — Ambulatory Visit: Payer: 59 | Admitting: Orthopaedic Surgery

## 2021-12-16 VITALS — Wt 296.0 lb

## 2021-12-16 DIAGNOSIS — Z96652 Presence of left artificial knee joint: Secondary | ICD-10-CM | POA: Insufficient documentation

## 2021-12-16 DIAGNOSIS — M1711 Unilateral primary osteoarthritis, right knee: Secondary | ICD-10-CM | POA: Diagnosis not present

## 2021-12-16 DIAGNOSIS — M25562 Pain in left knee: Secondary | ICD-10-CM

## 2021-12-16 DIAGNOSIS — G8929 Other chronic pain: Secondary | ICD-10-CM | POA: Diagnosis not present

## 2021-12-16 DIAGNOSIS — M25561 Pain in right knee: Secondary | ICD-10-CM | POA: Diagnosis not present

## 2021-12-16 NOTE — Progress Notes (Signed)
The patient is a very pleasant 56 year old female that I am seeing for the first time.  She is sent to me by my partner Dr. Louanne Skye to evaluate and treat right knee osteoarthritis.  She also has significant left knee pain.  She actually had her left knee replaced 7 to 8 years ago in [place].  She said she was the same weight then.  I was able to review x-rays of both knees and I went over them with her today.  She denies being a diabetic.  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  She has been dealing with chronic back issues quite a bit and is seeing Dr. Louanne Skye and Dr. Lorin Mercy as well as Dr. Ernestina Patches for these issues.  I am seeing her for her knees today.  She walks without assistive device.  She has had arthroscopic interventions on both knees and again a left knee arthroplasty on the left knee.  She said that they did not replaced underneath the kneecap/patella at that surgery.  Her BMI today is 47.78.  Examination of both knees shows she has good flexion extension of both knees.  Her left knee is stable with a well-healed surgical incision.  There is pain in her knee at the patella.  Her right knee has medial joint line tenderness and patellofemoral crepitation.  Both knees are ligamentously stable.  X-rays of the right knee show tricompartment arthritis mainly involving the medial compartment and the patellofemoral compartment.  There is osteophytes in all 3 compartments and slight varus malalignment.  X-rays of the left knee from earlier this year shows a well-seated press-fit total knee arthroplasty.  The patella shows no resurfacing.  I explained to her using a knee model what her knee replacement on the left side looks like.  We also talked about the osteoarthritis of her right knee.  She does understand that we cannot schedule surgery as of yet given her BMI of 47.78.  I would like to send her to Dr. Migdalia Dk weight loss clinic to see what can help the patient in terms of nonoperative weight loss treatment  plans.  She understands this fully and does agree with this referral.  I would like to see her back in 3 months but no x-rays are needed.  I would like a weight and BMI calculation at that visit.  I do feel that she can take significant weight off her frame that would help her knees but also significantly helped her lumbar spine.

## 2021-12-17 MED ORDER — HYDROCODONE-ACETAMINOPHEN 5-325 MG PO TABS: 1.0000 | ORAL_TABLET | Freq: Four times a day (QID) | ORAL | 0 refills | Status: DC | PRN

## 2021-12-23 ENCOUNTER — Encounter: Payer: Self-pay | Admitting: Specialist

## 2021-12-24 DIAGNOSIS — F5104 Psychophysiologic insomnia: Secondary | ICD-10-CM | POA: Diagnosis not present

## 2021-12-24 DIAGNOSIS — G4733 Obstructive sleep apnea (adult) (pediatric): Secondary | ICD-10-CM | POA: Diagnosis not present

## 2021-12-30 NOTE — Telephone Encounter (Signed)
Mailed to patient

## 2022-01-07 DIAGNOSIS — G4733 Obstructive sleep apnea (adult) (pediatric): Secondary | ICD-10-CM | POA: Diagnosis not present

## 2022-01-13 ENCOUNTER — Ambulatory Visit: Payer: 59 | Admitting: Bariatrics

## 2022-01-16 ENCOUNTER — Ambulatory Visit (INDEPENDENT_AMBULATORY_CARE_PROVIDER_SITE_OTHER): Payer: 59 | Admitting: Bariatrics

## 2022-01-16 ENCOUNTER — Ambulatory Visit: Payer: 59 | Admitting: Podiatry

## 2022-01-16 ENCOUNTER — Ambulatory Visit (INDEPENDENT_AMBULATORY_CARE_PROVIDER_SITE_OTHER): Payer: 59

## 2022-01-16 ENCOUNTER — Encounter: Payer: Self-pay | Admitting: Bariatrics

## 2022-01-16 VITALS — Ht 66.0 in | Wt 288.0 lb

## 2022-01-16 DIAGNOSIS — I10 Essential (primary) hypertension: Secondary | ICD-10-CM

## 2022-01-16 DIAGNOSIS — M545 Low back pain, unspecified: Secondary | ICD-10-CM

## 2022-01-16 DIAGNOSIS — M722 Plantar fascial fibromatosis: Secondary | ICD-10-CM

## 2022-01-16 DIAGNOSIS — M7752 Other enthesopathy of left foot: Secondary | ICD-10-CM | POA: Diagnosis not present

## 2022-01-16 DIAGNOSIS — R5383 Other fatigue: Secondary | ICD-10-CM

## 2022-01-16 DIAGNOSIS — Z0289 Encounter for other administrative examinations: Secondary | ICD-10-CM

## 2022-01-16 NOTE — Progress Notes (Signed)
  Office: [phone]  /  Fax: [phone]  Initial Visit  Kristen Woods was seen in clinic today to evaluate for obesity. She is interested in losing weight to improve overall health and reduce the risk of weight related complications.   She presents today to review program treatment options, initial physical assessment, and evaluation.      Past medical history includes:   Past Medical History:  Diagnosis Date   Hypertension    Leg pain    Liver disease    Nerves    Vision abnormalities      Objective:   Ht '5\' 6"'$  (1.676 m)   Wt 288 lb (130.6 kg)   LMP 01/04/2015   BMI 46.48 kg/m  She was weighed on the bioimpedance scale:  Body mass index is 46.48 kg/m.  General:  Alert, oriented and cooperative. Patient is in no acute distress.  Respiratory: Normal respiratory effort, no problems with respiration noted  Extremities: Normal range of motion.    Mental Status: Normal mood and affect. Normal behavior. Normal judgment and thought content.   Assessment and Plan:  1. Essential hypertension  2. Other fatigue - EKG 12-Lead  3. Left-sided low back pain without sciatica, unspecified chronicity      Obesity Treatment Plan:  She will work on garnering support from family and friends to begin weight loss journey. Work on eliminating or reducing the presence of highly processed, calorie dense foods in the home. Complete provided nutritional and psychosocial assessment questionnaire.    Kristen Woods will follow up in the next 1-2 weeks to review the above steps and continue evaluation and treatment.  Obesity Education Performed Today:  She was weighed on the bioimpedance scale and results were discussed and documented in the synopsis.  We discussed obesity as a disease and the importance of a more detailed evaluation of all the factors contributing to the disease.  We discussed the importance of long term lifestyle changes which include nutrition, exercise and  behavioral modifications as well as the importance of customizing this to her specific health and social needs.  We discussed the benefits of reaching a healthier weight to alleviate the symptoms of existing conditions and reduce the risks of the biomechanical, metabolic and psychological effects of obesity.  We discussed the goals of this program is to improve her overall health and not simply achieve a specific BMI.  Frequent visits are very important to patient success. I plan to see her every 2 weeks for the first 3 months and then evaluate the visit frequency after that time. I explained obesity is a life-long chronic disease and long term treatments would be required. Medications to help her follow his eating plan may be offered as appropriate but are not required. All medication decisions will be made together after the initial workup is done and benefits and side effects are discussed in depth.  The clinic rules were reviewed including the late policy, cancellation policy, no show and program fees.  Kristen Woods appears to be in the action stage of change and states they are ready to start intensive lifestyle modifications and behavioral modifications.  35 minutes was spent today on this visit including the above counseling, pre-visit chart review, and post-visit documentation.  Jearld Lesch, DO

## 2022-01-16 NOTE — Progress Notes (Signed)
Subjective: Chief Complaint  Patient presents with   Foot Pain    Plantar fasciitis /tendonitis to left foot. Patient stated it starts from the 5th toe to the heel. Lateral aspect of left foot. Throbbing and constant pain.     56 year old female presents the above concerns.  She states that she is still having pain mostly along the ankle.  She also states that she been having some knee pain.  She is on oxycodone for back issues.  Objective: AAO x3, NAD DP/PT pulses palpable bilaterally, CRT less than 3 seconds Medial ankle tenodnitis on the course of the flexor tendons as well as the lateral aspect along the peroneal tenodnitis down to the lateral foot.  No area pinpoint tenderness.  No edema.  No erythema or warmth. No pain with calf compression, swelling, warmth, erythema  Assessment: Tendinitis  Plan: -All treatment options discussed with the patient including all alternatives, risks, complications.  -CAM boot dispensed for immobilization.  Ice, elevation as well as compression. -If no improvement MRI -Patient encouraged to call the office with any questions, concerns, change in symptoms.   Trula Slade DPM

## 2022-01-30 ENCOUNTER — Ambulatory Visit: Payer: Self-pay | Admitting: Bariatrics

## 2022-02-04 ENCOUNTER — Encounter (INDEPENDENT_AMBULATORY_CARE_PROVIDER_SITE_OTHER): Payer: Self-pay | Admitting: Bariatrics

## 2022-02-04 ENCOUNTER — Ambulatory Visit (INDEPENDENT_AMBULATORY_CARE_PROVIDER_SITE_OTHER): Payer: Commercial Managed Care - HMO | Admitting: Bariatrics

## 2022-02-04 ENCOUNTER — Encounter: Payer: Self-pay | Admitting: Bariatrics

## 2022-02-04 VITALS — BP 163/98 | HR 75 | Temp 98.1°F | Ht 66.0 in | Wt 300.0 lb

## 2022-02-04 DIAGNOSIS — R0602 Shortness of breath: Secondary | ICD-10-CM

## 2022-02-04 DIAGNOSIS — I10 Essential (primary) hypertension: Secondary | ICD-10-CM

## 2022-02-04 DIAGNOSIS — Z6841 Body Mass Index (BMI) 40.0 and over, adult: Secondary | ICD-10-CM

## 2022-02-04 DIAGNOSIS — Z1331 Encounter for screening for depression: Secondary | ICD-10-CM

## 2022-02-04 DIAGNOSIS — R5383 Other fatigue: Secondary | ICD-10-CM

## 2022-02-04 DIAGNOSIS — E7849 Other hyperlipidemia: Secondary | ICD-10-CM | POA: Diagnosis not present

## 2022-02-04 DIAGNOSIS — R7309 Other abnormal glucose: Secondary | ICD-10-CM | POA: Diagnosis not present

## 2022-02-04 DIAGNOSIS — E66813 Obesity, class 3: Secondary | ICD-10-CM

## 2022-02-04 DIAGNOSIS — G4733 Obstructive sleep apnea (adult) (pediatric): Secondary | ICD-10-CM

## 2022-02-04 DIAGNOSIS — Z0000 Encounter for general adult medical examination without abnormal findings: Secondary | ICD-10-CM

## 2022-02-04 DIAGNOSIS — E559 Vitamin D deficiency, unspecified: Secondary | ICD-10-CM | POA: Insufficient documentation

## 2022-02-04 DIAGNOSIS — E669 Obesity, unspecified: Secondary | ICD-10-CM

## 2022-02-04 MED ORDER — LISINOPRIL-HYDROCHLOROTHIAZIDE 20-25 MG PO TABS: 1.0000 | ORAL_TABLET | Freq: Every day | ORAL | 0 refills | Status: DC

## 2022-02-05 LAB — HEMOGLOBIN A1C
Est. average glucose Bld gHb Est-mCnc: 126 mg/dL
Hgb A1c MFr Bld: 6 % — ABNORMAL HIGH (ref 4.8–5.6)

## 2022-02-05 LAB — LIPID PANEL WITH LDL/HDL RATIO
Cholesterol, Total: 209 mg/dL — ABNORMAL HIGH (ref 100–199)
HDL: 67 mg/dL (ref >39)
LDL Chol Calc (NIH): 122 mg/dL — ABNORMAL HIGH (ref 0–99)
LDL/HDL Ratio: 1.8 ratio (ref 0.0–3.2)
Triglycerides: 111 mg/dL (ref 0–149)
VLDL Cholesterol Cal: 20 mg/dL (ref 5–40)

## 2022-02-05 LAB — TSH+T4F+T3FREE
Free T4: 1.21 ng/dL (ref 0.82–1.77)
T3, Free: 2.9 pg/mL (ref 2.0–4.4)
TSH: 2.19 u[IU]/mL (ref 0.450–4.500)

## 2022-02-05 LAB — COMPREHENSIVE METABOLIC PANEL
ALT: 18 IU/L (ref 0–32)
AST: 18 IU/L (ref 0–40)
Albumin/Globulin Ratio: 1.6 (ref 1.2–2.2)
Albumin: 4.3 g/dL (ref 3.8–4.9)
Alkaline Phosphatase: 130 IU/L — ABNORMAL HIGH (ref 44–121)
BUN/Creatinine Ratio: 10 (ref 9–23)
BUN: 10 mg/dL (ref 6–24)
Bilirubin Total: 0.2 mg/dL (ref 0.0–1.2)
CO2: 23 mmol/L (ref 20–29)
Calcium: 9.6 mg/dL (ref 8.7–10.2)
Chloride: 103 mmol/L (ref 96–106)
Creatinine, Ser: 1.02 mg/dL — ABNORMAL HIGH (ref 0.57–1.00)
Globulin, Total: 2.7 g/dL (ref 1.5–4.5)
Glucose: 88 mg/dL (ref 70–99)
Potassium: 4.2 mmol/L (ref 3.5–5.2)
Sodium: 141 mmol/L (ref 134–144)
Total Protein: 7 g/dL (ref 6.0–8.5)
eGFR: 64 mL/min/{1.73_m2} (ref >59)

## 2022-02-05 LAB — VITAMIN D 25 HYDROXY (VIT D DEFICIENCY, FRACTURES): Vit D, 25-Hydroxy: 32.8 ng/mL (ref 30.0–100.0)

## 2022-02-05 LAB — INSULIN, RANDOM: INSULIN: 13.4 u[IU]/mL (ref 2.6–24.9)

## 2022-02-06 ENCOUNTER — Encounter (INDEPENDENT_AMBULATORY_CARE_PROVIDER_SITE_OTHER): Payer: Self-pay | Admitting: Bariatrics

## 2022-02-06 DIAGNOSIS — R7303 Prediabetes: Secondary | ICD-10-CM | POA: Insufficient documentation

## 2022-02-11 NOTE — Progress Notes (Signed)
Chief Complaint:   OBESITY Kristen Woods (MR# [id number]) is a 56 y.o. female who presents for evaluation and treatment of obesity and related comorbidities. Current BMI is Body mass index is 48.42 kg/m. Kristen Woods has been struggling with her weight for many years and has been unsuccessful in either losing weight, maintaining weight loss, or reaching her healthy weight goal.  Kristen Woods was seen for an information session on 01/16/2022. She is lactose intolerant.   Kristen Woods is currently in the action stage of change and ready to dedicate time achieving and maintaining a healthier weight. Kristen Woods is interested in becoming our patient and working on intensive lifestyle modifications including (but not limited to) diet and exercise for weight loss.  Kristen Woods's habits were reviewed today and are as follows: Her family eats meals together, she thinks her family will eat healthier with her, she has been heavy most of her life, she started gaining weight in 1987, her heaviest weight ever was 300 pounds, she skips meals frequently, she is frequently drinking liquids with calories, she frequently makes poor food choices, and she struggles with emotional eating.  Depression Screen Kristen Woods's Food and Mood (modified PHQ-9) score was 7.     02/04/2022    9:16 AM  Depression screen PHQ 2/9  Decreased Interest 1  Down, Depressed, Hopeless 0  PHQ - 2 Score 1  Altered sleeping 2  Tired, decreased energy 1  Change in appetite 2  Feeling bad or failure about yourself  0  Trouble concentrating 1  Moving slowly or fidgety/restless 0  Suicidal thoughts 0  PHQ-9 Score 7  Difficult doing work/chores Somewhat difficult   Subjective:   1. Other fatigue Kristen Woods admits to daytime somnolence and admits to waking up still tired. Patient has a history of symptoms of daytime fatigue and morning fatigue. Kristen Woods generally gets 4 hours of sleep per night, and states that she has nightime awakenings.  Snoring is present. Apneic episodes are present. Epworth Sleepiness Score is 8.   2. SOB (shortness of breath) on exertion Kristen Woods notes increasing shortness of breath with exercising and seems to be worsening over time with weight gain. She notes getting out of breath sooner with activity than she used to. This has not gotten worse recently. Kristen Woods denies shortness of breath at rest or orthopnea.  3. Obstructive sleep apnea syndrome Kristen Woods is taking melatonin. She has a CPAP and she is using it for 3-4 hours only.   4. Essential hypertension Kristen Woods's blood pressure is elevated today. She is taking Zestoretic, but she has been out of her medications.   5. Health care maintenance Given obesity.   6. Other hyperlipidemia Kristen Woods is taking Lipitor.   7. Elevated glucose Kristen Woods is not on medications currently.   8. Vitamin D deficiency Kristen Woods is taking Vitamin B, Zinc, tumeric, calcium, and Women's multivitamins, as well as joint supplement and biotin.    Assessment/Plan:   1. Other fatigue Kristen Woods does feel that her weight is causing her energy to be lower than it should be. Fatigue may be related to obesity, depression or many other causes. Labs will be ordered, and in the meanwhile, Kristen Woods will focus on self care including making healthy food choices, increasing physical activity and focusing on stress reduction.  - TSH+T4F+T3Free  2. SOB (shortness of breath) on exertion Kristen Woods does feel that she gets out of breath more easily that she used to when she exercises. Kristen Woods's shortness of breath appears to be obesity related and exercise  induced. She has agreed to work on weight loss and gradually increase exercise to treat her exercise induced shortness of breath. Will continue to monitor closely.  - TSH+T4F+T3Free  3. Obstructive sleep apnea syndrome We discussed the importance of consistent restful sleep.   - Comprehensive metabolic panel  4. Essential  hypertension We will check labs today. Kristen Woods will resume Zestoretic 20-25 mg once daily, and we will refill for 90 days.   - Comprehensive metabolic panel - lisinopril-hydrochlorothiazide (ZESTORETIC) 20-25 MG tablet; Take 1 tablet by mouth daily.  Dispense: 90 tablet; Refill: 0  5. Health care maintenance We will check labs today. EKG an IC were done today, and were reviewed with the patient.   6. Other hyperlipidemia We will check labs today. Kristen Woods will continue her medications, and will work on eliminating trans fats.   - Comprehensive metabolic panel - Lipid Panel With LDL/HDL Ratio  7. Elevated glucose We will check labs today, and we will follow-up at Kristen Woods's next visit.   - Comprehensive metabolic panel - Hemoglobin A1c - Insulin, random  8. Vitamin D deficiency We will check labs today, and we will follow-up at Kristen Woods's next visit.   - VITAMIN D 25 Hydroxy (Vit-D Deficiency, Fractures)  9. Depression screening Kristen Woods had a positive depression screening. Depression is commonly associated with obesity and often results in emotional eating behaviors. We will monitor this closely and work on CBT to help improve the non-hunger eating patterns. Referral to Psychology may be required if no improvement is seen as she continues in our clinic.  10. Obesity, Current BMI 48.4 Kristen Woods is currently in the action stage of change and her goal is to continue with weight loss efforts. I recommend Kristen Woods begin the structured treatment plan as follows:  She has agreed to the Category 3 Plan.  Reviewed labs from 09/25/2021, BMP. Mindful eating was discussed. Handout on shakes was given.   Exercise goals: No exercise has been prescribed at this time.   Behavioral modification strategies: increasing lean protein intake, decreasing simple carbohydrates, increasing vegetables, increasing water intake, decreasing eating out, no skipping meals, meal planning and cooking strategies,  keeping healthy foods in the home, and planning for success.  She was informed of the importance of frequent follow-up visits to maximize her success with intensive lifestyle modifications for her multiple health conditions. She was informed we would discuss her lab results at her next visit unless there is a critical issue that needs to be addressed sooner. Kristen Woods agreed to keep her next visit at the agreed upon time to discuss these results.  Objective:   Blood pressure (!) 163/98, pulse 75, temperature 98.1 F (36.7 C), height '5\' 6"'$  (1.676 m), weight 300 lb (136.1 kg), last menstrual period 01/04/2015, SpO2 98 %. Body mass index is 48.42 kg/m.  EKG: Normal sinus rhythm, rate 79 BPM.  Indirect Calorimeter completed today shows a VO2 of 280 and a REE of 1930.  Her calculated basal metabolic rate is 1478 thus her basal metabolic rate is better than expected.  General: Cooperative, alert, well developed, in no acute distress. HEENT: Conjunctivae and lids unremarkable. Cardiovascular: Regular rhythm.  Lungs: Normal work of breathing. Neurologic: No focal deficits.   Lab Results  Component Value Date   CREATININE 1.02 (H) 02/04/2022   BUN 10 02/04/2022   NA 141 02/04/2022   K 4.2 02/04/2022   CL 103 02/04/2022   CO2 23 02/04/2022   Lab Results  Component Value Date   ALT 18 02/04/2022  AST 18 02/04/2022   ALKPHOS 130 (H) 02/04/2022   BILITOT 0.2 02/04/2022   Lab Results  Component Value Date   HGBA1C 6.0 (H) 02/04/2022   Lab Results  Component Value Date   INSULIN 13.4 02/04/2022   Lab Results  Component Value Date   TSH 2.190 02/04/2022   Lab Results  Component Value Date   CHOL 209 (H) 02/04/2022   HDL 67 02/04/2022   LDLCALC 122 (H) 02/04/2022   TRIG 111 02/04/2022   Lab Results  Component Value Date   WBC WILL FOLLOW 09/25/2021   HGB WILL FOLLOW 09/25/2021   HCT WILL FOLLOW 09/25/2021   MCV WILL FOLLOW 09/25/2021   PLT WILL FOLLOW 09/25/2021   No  results found for: "IRON", "TIBC", "FERRITIN"  Attestation Statements:   Reviewed by clinician on day of visit: allergies, medications, problem list, medical history, surgical history, family history, social history, and previous encounter notes.   Wilhemena Durie, am acting as Location manager for [employer], DO.  I have reviewed the above documentation for accuracy and completeness, and I agree with the above. Jearld Lesch, DO

## 2022-02-13 ENCOUNTER — Ambulatory Visit: Payer: 59 | Admitting: Nurse Practitioner

## 2022-02-18 ENCOUNTER — Encounter: Payer: Self-pay | Admitting: Nurse Practitioner

## 2022-02-18 ENCOUNTER — Ambulatory Visit (INDEPENDENT_AMBULATORY_CARE_PROVIDER_SITE_OTHER): Payer: Commercial Managed Care - HMO | Admitting: Nurse Practitioner

## 2022-02-18 VITALS — BP 155/92 | HR 74 | Temp 98.1°F | Ht 66.0 in | Wt 291.0 lb

## 2022-02-18 DIAGNOSIS — I10 Essential (primary) hypertension: Secondary | ICD-10-CM

## 2022-02-18 DIAGNOSIS — E669 Obesity, unspecified: Secondary | ICD-10-CM | POA: Diagnosis not present

## 2022-02-18 DIAGNOSIS — K219 Gastro-esophageal reflux disease without esophagitis: Secondary | ICD-10-CM | POA: Diagnosis not present

## 2022-02-18 DIAGNOSIS — Z6841 Body Mass Index (BMI) 40.0 and over, adult: Secondary | ICD-10-CM

## 2022-02-18 MED ORDER — OMEPRAZOLE 40 MG PO CPDR: 40.0000 mg | DELAYED_RELEASE_CAPSULE | Freq: Every day | ORAL | 0 refills | Status: DC

## 2022-02-20 NOTE — Progress Notes (Signed)
Chief Complaint:   OBESITY Kristen Woods is here to discuss her progress with her obesity treatment plan along with follow-up of her obesity related diagnoses. Kristen Woods is on unknown and states she is following her eating plan approximately 80% of the time. Kristen Woods states she is walking 15 minutes 5 times per week.  Today's visit was #: 2 Starting weight: 300 lbs Starting date: 02/04/2022 Today's weight: 291 lbs Today's date: 02/18/2022 Total lbs lost to date: 9 lbs Total lbs lost since last in-office visit: 9  Interim History: Kristen Woods has done well with weight loss since her last visit. Struggling with stress/emotional eating and cravings. Journals daily and finds it helpful. Breakfast: 1 egg with toast. Lunch: protein and veggies. Dinner: protein and veggies. Sometimes skips breakfast. Drinking water and diet hot tea.  Subjective:   1. Essential hypertension Kristen Woods's blood pressure is elevated today. Notes she feels better with a higher blood pressure. She is taking Lisinopril HCT 20-25 mg. Denies any side effects. Denies any chest pain,shortness of breath or palpitations.  2. Gastroesophageal reflux disease, unspecified whether esophagitis present Kristen Woods is taking Prilosec 40 mg. Denies side effects.   Assessment/Plan:   1. Essential hypertension Continue taking medication as directed. Side effects discussed. Discussed the risks associated with HTN such as but not limited to: headaches, vision problems, irregular heart rate/palpitations, dizziness, heart attack, stroke, etc.   Kristen Woods is working on healthy weight loss and exercise to improve blood pressure control. We will watch for signs of hypotension as she continues her lifestyle modifications.   2. Gastroesophageal reflux disease, unspecified whether esophagitis present We will refill Prilosec 40 mg daily for 1 month with 0 refills. Seeing a new PCP on 03/19/22, to discuss Prilosec and possible EGD and for further  refills.   -Refill omeprazole (PRILOSEC) 40 MG capsule; Take 1 capsule (40 mg total) by mouth daily.  Dispense: 30 capsule; Refill: 0  3. Obesity, Current BMI 47.0 Kristen Woods is currently in the action stage of change. As such, her goal is to continue with weight loss efforts. She has agreed to keeping a food journal and adhering to recommended goals of 1500 calories and 100+ grams of protein.   Exercise goals: As is.  Behavioral modification strategies: increasing lean protein intake, increasing water intake, planning for success, and keeping a strict food journal.  Kristen Woods has agreed to follow-up with our clinic in 2 weeks. She was informed of the importance of frequent follow-up visits to maximize her success with intensive lifestyle modifications for her multiple health conditions.   Objective:   Blood pressure (!) 155/92, pulse 74, temperature 98.1 F (36.7 C), temperature source Oral, height '5\' 6"'$  (1.676 m), weight 291 lb (132 kg), last menstrual period 01/04/2015, SpO2 99 %. Body mass index is 46.97 kg/m.  General: Cooperative, alert, well developed, in no acute distress. HEENT: Conjunctivae and lids unremarkable. Cardiovascular: Regular rhythm.  Lungs: Normal work of breathing. Neurologic: No focal deficits.   Lab Results  Component Value Date   CREATININE 1.02 (H) 02/04/2022   BUN 10 02/04/2022   NA 141 02/04/2022   K 4.2 02/04/2022   CL 103 02/04/2022   CO2 23 02/04/2022   Lab Results  Component Value Date   ALT 18 02/04/2022   AST 18 02/04/2022   ALKPHOS 130 (H) 02/04/2022   BILITOT 0.2 02/04/2022   Lab Results  Component Value Date   HGBA1C 6.0 (H) 02/04/2022   Lab Results  Component Value Date   INSULIN  13.4 02/04/2022   Lab Results  Component Value Date   TSH 2.190 02/04/2022   Lab Results  Component Value Date   CHOL 209 (H) 02/04/2022   HDL 67 02/04/2022   LDLCALC 122 (H) 02/04/2022   TRIG 111 02/04/2022   Lab Results  Component Value Date    VD25OH 32.8 02/04/2022   Lab Results  Component Value Date   WBC WILL FOLLOW 09/25/2021   HGB WILL FOLLOW 09/25/2021   HCT WILL FOLLOW 09/25/2021   MCV WILL FOLLOW 09/25/2021   PLT WILL FOLLOW 09/25/2021   No results found for: "IRON", "TIBC", "FERRITIN"  Attestation Statements:   Reviewed by clinician on day of visit: allergies, medications, problem list, medical history, surgical history, family history, social history, and previous encounter notes.  I, Brendell Tyus, RMA, am acting as transcriptionist for Everardo Pacific, FNP.  I have reviewed the above documentation for accuracy and completeness, and I agree with the above. Everardo Pacific, FNP

## 2022-02-26 ENCOUNTER — Encounter: Payer: Self-pay | Admitting: Bariatrics

## 2022-02-27 ENCOUNTER — Ambulatory Visit: Payer: 59 | Admitting: Nurse Practitioner

## 2022-02-28 ENCOUNTER — Telehealth: Payer: Self-pay | Admitting: Orthopaedic Surgery

## 2022-02-28 ENCOUNTER — Encounter: Payer: Self-pay | Admitting: General Practice

## 2022-02-28 NOTE — Telephone Encounter (Signed)
Pt called requesting a new note for work. Pt states Dr. Louanne Skye was her doctor and seen you Dr Ninfa Linden last about her back last. Dr Louanne Skye sent referral to see Dr Ernestina Patches and pt had back injection that did not help. Pt states her employer took away her chair to sit durning her shift away since Dr Louanne Skye retired and unable to get updated note from him. Pt is asking for a note to say she has restrictions on standing. She stands for 8 hrs and her back and hip are in severe pain. Please call pt about this matter at [phone]. Pt also is asking for Dr. Ninfa Linden to recommend who she can see for her back at our practice. Please send note to pt's mychart.

## 2022-02-28 NOTE — Telephone Encounter (Signed)
Patient aware of the below and that note is written for her

## 2022-03-04 ENCOUNTER — Ambulatory Visit (INDEPENDENT_AMBULATORY_CARE_PROVIDER_SITE_OTHER): Payer: Commercial Managed Care - HMO | Admitting: Orthopedic Surgery

## 2022-03-04 ENCOUNTER — Ambulatory Visit: Payer: Self-pay | Admitting: Podiatry

## 2022-03-04 ENCOUNTER — Encounter: Payer: Self-pay | Admitting: Orthopedic Surgery

## 2022-03-04 ENCOUNTER — Encounter: Payer: Self-pay | Admitting: Podiatry

## 2022-03-04 ENCOUNTER — Ambulatory Visit (INDEPENDENT_AMBULATORY_CARE_PROVIDER_SITE_OTHER): Payer: Commercial Managed Care - HMO

## 2022-03-04 VITALS — BP 101/67 | HR 78 | Ht 66.0 in | Wt 291.0 lb

## 2022-03-04 DIAGNOSIS — M5136 Other intervertebral disc degeneration, lumbar region: Secondary | ICD-10-CM

## 2022-03-04 DIAGNOSIS — M7752 Other enthesopathy of left foot: Secondary | ICD-10-CM

## 2022-03-04 DIAGNOSIS — M4316 Spondylolisthesis, lumbar region: Secondary | ICD-10-CM

## 2022-03-04 DIAGNOSIS — M51369 Other intervertebral disc degeneration, lumbar region without mention of lumbar back pain or lower extremity pain: Secondary | ICD-10-CM

## 2022-03-04 MED ORDER — DICLOFENAC SODIUM 1 % EX GEL: 2.0000 g | Freq: Four times a day (QID) | CUTANEOUS | 2 refills | Status: DC
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
## 2022-03-04 NOTE — Patient Instructions (Signed)
You can use 40% urea cream for the dry skin

## 2022-03-04 NOTE — Progress Notes (Signed)
Subjective: Chief Complaint  Patient presents with   Foot Pain    Left foot pain, lateral side of the foot and heel pain, rate of pain 8 out of 10, did some icing, not wearing the cam boot    56 year old female presents the above concerns.  She could not wear the boot.   She is soaking her feet but starting to get somell hard things on the bottom  Side still hurts. She states it is because of the standing. At work they took her stool away from her and due to the increase standing it hurts.   Denies any systemic complaints such as fevers, chills, nausea, vomiting. No acute changes since last appointment, and no other complaints at this time.   Objective: AAO x3, NAD DP/PT pulses palpable bilaterally, CRT less than 3 seconds Which point tenderness is still on the course of the peroneal tendon today mostly towards the insertion of the fifth metatarsal base.  No severe pain along medial aspect ankle.  Clinically the tendons appear to be intact.  There is no area pinpoint tenderness.  Flexor, extensor tendons intact.  Ankle joint range of motion intact.  MMT 5/5. No pain with calf compression, swelling, warmth, erythema  Assessment: Peroneal tendinitis  Plan: -All treatment options discussed with the patient including all alternatives, risks, complications.  -I did offer steroid injection to do the fifth metatarsal base but she decided to hold off on this.  Continue shoes good arch support.  Also note provided work to sit at work as it is is also contributing to her symptoms that increase standing. -Symptoms persist proceed with advanced imaging. -Patient encouraged to call the office with any questions, concerns, change in symptoms.

## 2022-03-05 ENCOUNTER — Ambulatory Visit (INDEPENDENT_AMBULATORY_CARE_PROVIDER_SITE_OTHER): Payer: Commercial Managed Care - HMO | Admitting: Nurse Practitioner

## 2022-03-05 ENCOUNTER — Encounter: Payer: Self-pay | Admitting: Nurse Practitioner

## 2022-03-05 VITALS — BP 138/83 | HR 96 | Temp 98.1°F | Ht 66.0 in | Wt 291.0 lb

## 2022-03-05 DIAGNOSIS — I10 Essential (primary) hypertension: Secondary | ICD-10-CM | POA: Diagnosis not present

## 2022-03-05 DIAGNOSIS — E782 Mixed hyperlipidemia: Secondary | ICD-10-CM

## 2022-03-05 DIAGNOSIS — Z6841 Body Mass Index (BMI) 40.0 and over, adult: Secondary | ICD-10-CM

## 2022-03-05 DIAGNOSIS — E669 Obesity, unspecified: Secondary | ICD-10-CM

## 2022-03-10 NOTE — Progress Notes (Unsigned)
Chief Complaint:   OBESITY Kristen Woods is here to discuss her progress with her obesity treatment plan along with follow-up of her obesity related diagnoses. Kristen Woods is on keeping a food journal and adhering to recommended goals of 1500 calories and 100+ grams of protein and states she is following her eating plan approximately 80% of the time. Kristen Woods states she is walking and climbing stairs 15 minutes 5 times per week.  Today's visit was #: 3 Starting weight: 300 lbs Starting date: 02/04/2022 Today's weight: 291 lbs Today's date: 03/05/2022 Total lbs lost to date: 9 lbs Total lbs lost since last in-office visit: 0  Interim History: Kristen Woods is using Myfitness Pal app. Calories:1500. Protein: 100+ grams. Feels that the "food is too little". She is drinking 2 proteins shakes daily. Drinks more water and also Yogi tea. Stopped coffee, Pepsi and juice.  Subjective:   1. Essential hypertension Davan is taking Lasix 20 mg and Lisinopril HCT 20-25 mg. Denies any side effects. Denies any chest pain,shortness of breath or palpitations.  2. Mixed hyperlipidemia Jordan is not taking Lipitor due to finding a new PCP.  Assessment/Plan:   1. Essential hypertension Keep appointment with PCP and continue medications as directed.  2. Mixed hyperlipidemia Has appointment scheduled this month with new PCP. Cardiovascular risk and specific lipid/LDL goals reviewed.  We discussed several lifestyle modifications today and Kristen Woods will continue to work on diet, exercise and weight loss efforts. Orders and follow up as documented in patient record.   Counseling Intensive lifestyle modifications are the first line treatment for this issue. Dietary changes: Increase soluble fiber. Decrease simple carbohydrates. Exercise changes: Moderate to vigorous-intensity aerobic activity 150 minutes per week if tolerated. Lipid-lowering medications: see documented in medical record.   3. Obesity,  Current BMI 47.0 Kristen Woods is currently in the action stage of change. As such, her goal is to continue with weight loss efforts. She has agreed to the Category 3 Plan.   Exercise goals: As is.  Multiple handouts given today.  Behavioral modification strategies: no skipping meals, meal planning and cooking strategies, and holiday eating strategies .  Kristen Woods has agreed to follow-up with our clinic in 3 weeks. She was informed of the importance of frequent follow-up visits to maximize her success with intensive lifestyle modifications for her multiple health conditions.   Objective:   Blood pressure 138/83, pulse 96, temperature 98.1 F (36.7 C), temperature source Oral, height '5\' 6"'$  (1.676 m), weight 291 lb (132 kg), last menstrual period 01/04/2015, SpO2 98 %. Body mass index is 46.97 kg/m.  General: Cooperative, alert, well developed, in no acute distress. HEENT: Conjunctivae and lids unremarkable. Cardiovascular: Regular rhythm.  Lungs: Normal work of breathing. Neurologic: No focal deficits.   Lab Results  Component Value Date   CREATININE 1.02 (H) 02/04/2022   BUN 10 02/04/2022   NA 141 02/04/2022   K 4.2 02/04/2022   CL 103 02/04/2022   CO2 23 02/04/2022   Lab Results  Component Value Date   ALT 18 02/04/2022   AST 18 02/04/2022   ALKPHOS 130 (H) 02/04/2022   BILITOT 0.2 02/04/2022   Lab Results  Component Value Date   HGBA1C 6.0 (H) 02/04/2022   Lab Results  Component Value Date   INSULIN 13.4 02/04/2022   Lab Results  Component Value Date   TSH 2.190 02/04/2022   Lab Results  Component Value Date   CHOL 209 (H) 02/04/2022   HDL 67 02/04/2022   LDLCALC 122 (H)  02/04/2022   TRIG 111 02/04/2022   Lab Results  Component Value Date   VD25OH 32.8 02/04/2022   Lab Results  Component Value Date   WBC WILL FOLLOW 09/25/2021   HGB WILL FOLLOW 09/25/2021   HCT WILL FOLLOW 09/25/2021   MCV WILL FOLLOW 09/25/2021   PLT WILL FOLLOW 09/25/2021   No  results found for: "IRON", "TIBC", "FERRITIN"  Attestation Statements:   Reviewed by clinician on day of visit: allergies, medications, problem list, medical history, surgical history, family history, social history, and previous encounter notes.  I, Brendell Tyus, RMA, am acting as transcriptionist for Everardo Pacific, FNP.  I have reviewed the above documentation for accuracy and completeness, and I agree with the above. -  ***

## 2022-03-18 ENCOUNTER — Telehealth: Payer: Self-pay | Admitting: Orthopaedic Surgery

## 2022-03-18 NOTE — Telephone Encounter (Signed)
Pt came to office to talk about doctor note that was sent to her through mychart... Pt stated that the note for her job didn't stated if the pt situation was temporary or permanent.... Pt is request for the note to be update stating that her situation is permanent... Pt stated that her other note states permanent as well... Pt requesting callback.Marland KitchenMarland Kitchen

## 2022-03-18 NOTE — Telephone Encounter (Signed)
[place] for restrictions to be permanent?

## 2022-03-18 NOTE — Telephone Encounter (Signed)
Emailed note to patient

## 2022-03-19 ENCOUNTER — Ambulatory Visit (INDEPENDENT_AMBULATORY_CARE_PROVIDER_SITE_OTHER): Payer: Commercial Managed Care - HMO | Admitting: Family Medicine

## 2022-03-19 ENCOUNTER — Encounter: Payer: Self-pay | Admitting: Family Medicine

## 2022-03-19 VITALS — BP 136/88 | HR 76 | Temp 98.1°F | Resp 16 | Ht 66.0 in | Wt 300.4 lb

## 2022-03-19 DIAGNOSIS — Z23 Encounter for immunization: Secondary | ICD-10-CM | POA: Diagnosis not present

## 2022-03-19 DIAGNOSIS — M5137 Other intervertebral disc degeneration, lumbosacral region: Secondary | ICD-10-CM

## 2022-03-19 DIAGNOSIS — K219 Gastro-esophageal reflux disease without esophagitis: Secondary | ICD-10-CM | POA: Diagnosis not present

## 2022-03-19 DIAGNOSIS — Z7689 Persons encountering health services in other specified circumstances: Secondary | ICD-10-CM

## 2022-03-19 DIAGNOSIS — I10 Essential (primary) hypertension: Secondary | ICD-10-CM | POA: Diagnosis not present

## 2022-03-19 DIAGNOSIS — E782 Mixed hyperlipidemia: Secondary | ICD-10-CM

## 2022-03-19 DIAGNOSIS — Z6841 Body Mass Index (BMI) 40.0 and over, adult: Secondary | ICD-10-CM

## 2022-03-19 MED ORDER — MONTELUKAST SODIUM 10 MG PO TABS: 10.0000 mg | ORAL_TABLET | Freq: Every day | ORAL | 1 refills | Status: DC

## 2022-03-19 MED ORDER — LISINOPRIL-HYDROCHLOROTHIAZIDE 20-25 MG PO TABS: 1.0000 | ORAL_TABLET | Freq: Every day | ORAL | 1 refills | Status: DC

## 2022-03-19 MED ORDER — GABAPENTIN 300 MG PO CAPS: 300.0000 mg | ORAL_CAPSULE | Freq: Three times a day (TID) | ORAL | 3 refills | Status: DC

## 2022-03-19 MED ORDER — OMEPRAZOLE 40 MG PO CPDR: 40.0000 mg | DELAYED_RELEASE_CAPSULE | Freq: Every day | ORAL | 1 refills | Status: DC

## 2022-03-19 NOTE — Telephone Encounter (Signed)
Pt portal message sent advising pt

## 2022-03-19 NOTE — Telephone Encounter (Signed)
Work note completed and sent to pts [employer]

## 2022-03-19 NOTE — Progress Notes (Unsigned)
Patient is here to established care with provider. ~health hx address ~care gaps address

## 2022-03-19 NOTE — Telephone Encounter (Signed)
emailed

## 2022-03-20 ENCOUNTER — Encounter (HOSPITAL_BASED_OUTPATIENT_CLINIC_OR_DEPARTMENT_OTHER): Payer: Self-pay

## 2022-03-20 ENCOUNTER — Encounter: Payer: Self-pay | Admitting: Family Medicine

## 2022-03-20 ENCOUNTER — Emergency Department (HOSPITAL_BASED_OUTPATIENT_CLINIC_OR_DEPARTMENT_OTHER)
Admission: EM | Admit: 2022-03-20 | Discharge: 2022-03-20 | Disposition: A | Discharge status: Home / Self Care | Payer: Commercial Managed Care - HMO | Attending: Emergency Medicine | Admitting: Emergency Medicine

## 2022-03-20 ENCOUNTER — Emergency Department (HOSPITAL_BASED_OUTPATIENT_CLINIC_OR_DEPARTMENT_OTHER): Payer: Commercial Managed Care - HMO

## 2022-03-20 ENCOUNTER — Telehealth: Payer: Self-pay | Admitting: Orthopaedic Surgery

## 2022-03-20 DIAGNOSIS — I10 Essential (primary) hypertension: Secondary | ICD-10-CM | POA: Insufficient documentation

## 2022-03-20 DIAGNOSIS — M5432 Sciatica, left side: Secondary | ICD-10-CM

## 2022-03-20 DIAGNOSIS — M545 Low back pain, unspecified: Secondary | ICD-10-CM | POA: Diagnosis present

## 2022-03-20 DIAGNOSIS — M5442 Lumbago with sciatica, left side: Secondary | ICD-10-CM | POA: Diagnosis not present

## 2022-03-20 MED ORDER — METHOCARBAMOL 500 MG PO TABS: 500.0000 mg | ORAL_TABLET | Freq: Three times a day (TID) | ORAL | 0 refills | Status: DC | PRN

## 2022-03-20 MED ORDER — DEXAMETHASONE SODIUM PHOSPHATE 10 MG/ML IJ SOLN
10.0000 mg | Freq: Once | INTRAMUSCULAR | Status: AC
  Administered 2022-03-20: 10 mg via INTRAMUSCULAR
  Filled 2022-03-20: qty 1

## 2022-03-20 MED ORDER — KETOROLAC TROMETHAMINE 30 MG/ML IJ SOLN
30.0000 mg | Freq: Once | INTRAMUSCULAR | Status: AC
  Administered 2022-03-20: 30 mg via INTRAMUSCULAR
  Filled 2022-03-20: qty 1

## 2022-03-20 MED ORDER — DICLOFENAC SODIUM 1 % EX GEL: 2.0000 g | Freq: Four times a day (QID) | CUTANEOUS | 0 refills | Status: AC | PRN
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
## 2022-03-20 NOTE — Discharge Instructions (Signed)
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
## 2022-03-20 NOTE — Progress Notes (Signed)
New Patient Office Visit  Subjective    Patient ID: Kristen Woods, female    DOB: 11-Aug-1965  Age: 56 y.o. MRN: [id number]  CC:  Chief Complaint  Patient presents with   Establish Care    HPI Kristen Woods presents to establish care and for review of chronic med issues. Patient denies acute complaints or concerns.    Outpatient Encounter Medications as of 03/19/2022  Medication Sig   gabapentin (NEURONTIN) 300 MG capsule Take 1 capsule (300 mg total) by mouth 3 (three) times daily.   [DISCONTINUED] lisinopril-hydrochlorothiazide (ZESTORETIC) 20-25 MG tablet Take 1 tablet by mouth daily.   [DISCONTINUED] montelukast (SINGULAIR) 10 MG tablet Take 10 mg by mouth at bedtime.   [DISCONTINUED] omeprazole (PRILOSEC) 40 MG capsule Take by mouth.   lisinopril-hydrochlorothiazide (ZESTORETIC) 20-25 MG tablet Take 1 tablet by mouth daily.   montelukast (SINGULAIR) 10 MG tablet Take 1 tablet (10 mg total) by mouth at bedtime.   omeprazole (PRILOSEC) 40 MG capsule Take 1 capsule (40 mg total) by mouth daily.   [DISCONTINUED] allopurinol (ZYLOPRIM) 100 MG tablet Take 1 tablet (100 mg total) by mouth 2 (two) times daily. (Patient not taking: Reported on 02/04/2022)   [DISCONTINUED] ascorbic acid (VITAMIN C) 500 MG tablet Take 500 mg by mouth daily. (Patient not taking: Reported on 02/04/2022)   [DISCONTINUED] atorvastatin (LIPITOR) 20 MG tablet  (Patient not taking: Reported on 02/04/2022)   [DISCONTINUED] benzonatate (TESSALON) 100 MG capsule Take 1 capsule (100 mg total) by mouth every 8 (eight) hours. (Patient not taking: Reported on 02/04/2022)   [DISCONTINUED] Biotin 10 MG CAPS Take 1 tablet by mouth daily. (Patient not taking: Reported on 02/04/2022)   [DISCONTINUED] colchicine 0.6 MG tablet Take 1 tablet (0.6 mg total) by mouth 2 (two) times daily. (Patient not taking: Reported on 02/04/2022)   [DISCONTINUED] colchicine 0.6 MG tablet Take 1 tablet (0.6 mg total) by mouth 2 (two) times  daily. (Patient not taking: Reported on 02/04/2022)   [DISCONTINUED] cyclobenzaprine (FLEXERIL) 10 MG tablet Take 1 tablet (10 mg total) by mouth 3 (three) times daily as needed for muscle spasms. (Patient not taking: Reported on 02/04/2022)   [DISCONTINUED] cyclobenzaprine (FLEXERIL) 10 MG tablet Take 1 tablet (10 mg total) by mouth 3 (three) times daily as needed for muscle spasms. (Patient not taking: Reported on 02/04/2022)   [DISCONTINUED] diclofenac Sodium (VOLTAREN) 1 % GEL Apply 2 g topically 4 (four) times daily. Rub into affected area of foot 2 to 4 times daily   [DISCONTINUED] furosemide (LASIX) 20 MG tablet Take 20 mg by mouth 2 (two) times daily.   [DISCONTINUED] gabapentin (NEURONTIN) 300 MG capsule One in am, one in evening and two at bedtime po (Patient not taking: Reported on 02/04/2022)   [DISCONTINUED] HYDROcodone-acetaminophen (NORCO/VICODIN) 5-325 MG per tablet Take 1 tablet by mouth every 6 (six) hours as needed for moderate pain or severe pain. (Patient not taking: Reported on 02/04/2022)   [DISCONTINUED] HYDROcodone-acetaminophen (NORCO/VICODIN) 5-325 MG tablet Take 1-2 tablets by mouth every 6 (six) hours as needed for moderate pain. (Patient not taking: Reported on 02/04/2022)   [DISCONTINUED] HYDROcodone-acetaminophen (NORCO/VICODIN) 5-325 MG tablet Take 1 tablet by mouth every 6 (six) hours as needed for moderate pain. (Patient not taking: Reported on 02/04/2022)   [DISCONTINUED] ibuprofen (ADVIL) 800 MG tablet Take 1 tablet (800 mg total) by mouth every 8 (eight) hours as needed. (Patient not taking: Reported on 02/04/2022)   [DISCONTINUED] methylPREDNISolone (MEDROL DOSEPAK) 4 MG TBPK tablet Take 6 tablets on  day one, then decrease by one tablet each day until gone. (Patient not taking: Reported on 02/04/2022)   [DISCONTINUED] methylPREDNISolone (MEDROL DOSEPAK) 4 MG TBPK tablet Take as directed (Patient not taking: Reported on 02/04/2022)   [DISCONTINUED] Multiple  Vitamins-Minerals (ALIVE WOMENS 50+ PO) Take 1 tablet by mouth 2 (two) times daily. (Patient not taking: Reported on 02/04/2022)   [DISCONTINUED] omeprazole (PRILOSEC) 40 MG capsule Take 1 capsule (40 mg total) by mouth daily. (Patient not taking: Reported on 03/05/2022)   [DISCONTINUED] traMADol-acetaminophen (ULTRACET) 37.5-325 MG tablet Take 1 tablet by mouth every 6 (six) hours as needed. (Patient not taking: Reported on 02/04/2022)   No facility-administered encounter medications on file as of 03/19/2022.    Past Medical History:  Diagnosis Date   Back pain    Chronic fatigue syndrome    Depression    High cholesterol    Hypertension    Joint pain    Joint pain    Leg pain    Liver disease    Nerves    Sleep apnea    Swelling of lower extremity    Vision abnormalities     Past Surgical History:  Procedure Laterality Date   BREAST SURGERY     INCONTINENCE SURGERY     KNEE SURGERY      Family History  Problem Relation Age of Onset   Hypertension Mother    Heart disease Mother    Diabetes Father    Hyperlipidemia Other    Heart attack Neg Hx    Sudden death Neg Hx     Social History   Socioeconomic History   Marital status: Single    Spouse name: Not on file   Number of children: Not on file   Years of education: Not on file   Highest education level: Not on file  Occupational History   Not on file  Tobacco Use   Smoking status: Never   Smokeless tobacco: Not on file  Substance and Sexual Activity   Alcohol use: No   Drug use: No   Sexual activity: Not on file  Other Topics Concern   Not on file  Social History Narrative   Not on file   Social Determinants of Health   Financial Resource Strain: Not on file  Food Insecurity: Not on file  Transportation Needs: Not on file  Physical Activity: Not on file  Stress: Not on file  Social Connections: Not on file  Intimate Partner Violence: Not on file    Review of Systems  All other systems  reviewed and are negative.       Objective    BP 136/88   Pulse 76   Temp 98.1 F (36.7 C) (Oral)   Resp 16   Ht '5\' 6"'$  (1.676 m)   Wt (!) 300 lb 6.4 oz (136.3 kg)   LMP 01/04/2015   SpO2 94%   BMI 48.49 kg/m   Physical Exam Vitals and nursing note reviewed.  Constitutional:      General: She is not in acute distress.    Appearance: She is obese.  Cardiovascular:     Rate and Rhythm: Normal rate and regular rhythm.  Pulmonary:     Effort: Pulmonary effort is normal.     Breath sounds: Normal breath sounds.  Neurological:     General: No focal deficit present.     Mental Status: She is alert and oriented to person, place, and time.  Psychiatric:  Mood and Affect: Mood normal.        Behavior: Behavior normal.         Assessment & Plan:   1. Essential hypertension Appears stable with present management. Continue. Meds refilled.  - lisinopril-hydrochlorothiazide (ZESTORETIC) 20-25 MG tablet; Take 1 tablet by mouth daily.  Dispense: 90 tablet; Refill: 1  2. Class 3 severe obesity due to excess calories with serious comorbidity and body mass index (BMI) of 45.0 to 49.9 in adult [facility]) Discussed dietary and activity options. Working with Optometrist  3. Mixed hyperlipidemia continue  4. Gastroesophageal reflux disease, unspecified whether esophagitis present Discussed dietary and activity options.  continue  5. DDD (degenerative disc disease), lumbosacral Management as per consultant.  6. Need for shingles vaccine  - Varicella-zoster vaccine IM  7. Encounter to establish care     Return in about 4 months (around 07/18/2022) for physical, follow up.   Becky Sax, MD
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
## 2022-03-26 ENCOUNTER — Encounter: Payer: Self-pay | Admitting: Nurse Practitioner

## 2022-03-26 ENCOUNTER — Ambulatory Visit (INDEPENDENT_AMBULATORY_CARE_PROVIDER_SITE_OTHER): Payer: Commercial Managed Care - HMO | Admitting: Nurse Practitioner

## 2022-03-26 VITALS — BP 134/88 | HR 71 | Temp 98.0°F | Ht 66.0 in | Wt 288.0 lb

## 2022-03-26 DIAGNOSIS — E669 Obesity, unspecified: Secondary | ICD-10-CM

## 2022-03-26 DIAGNOSIS — I10 Essential (primary) hypertension: Secondary | ICD-10-CM | POA: Diagnosis not present

## 2022-03-26 DIAGNOSIS — Z6841 Body Mass Index (BMI) 40.0 and over, adult: Secondary | ICD-10-CM | POA: Diagnosis not present

## 2022-03-26 NOTE — Patient Instructions (Signed)

## 2022-04-02 ENCOUNTER — Ambulatory Visit: Payer: Commercial Managed Care - HMO | Admitting: Orthopedic Surgery

## 2022-04-08 ENCOUNTER — Telehealth: Payer: Self-pay | Admitting: Orthopaedic Surgery

## 2022-04-08 NOTE — Telephone Encounter (Signed)
Called pt 1X and left vm. Pt sent a mychart message to reschedule appt. Pt has an appt with Dr Ninfa Linden and Dr Sharol Given. Not sure if she want to reschedule both or just one appt.

## 2022-04-10 ENCOUNTER — Ambulatory Visit: Payer: Commercial Managed Care - HMO | Admitting: Orthopedic Surgery

## 2022-04-10 DIAGNOSIS — G8929 Other chronic pain: Secondary | ICD-10-CM | POA: Diagnosis not present

## 2022-04-10 DIAGNOSIS — M25561 Pain in right knee: Secondary | ICD-10-CM

## 2022-04-10 DIAGNOSIS — M25562 Pain in left knee: Secondary | ICD-10-CM | POA: Diagnosis not present

## 2022-04-11 NOTE — Progress Notes (Signed)
Office Visit Note   Patient: Kristen Woods           Date of Birth: 03-Jul-1965           MRN: [id number] Visit Date: 04/10/2022              Requested by: Dorna Mai, [address] [place],  [place] [address] PCP: Dorna Mai, MD  Chief Complaint  Patient presents with   Right Knee - Pain   Left Knee - Pain      HPI: Patient is a 56 year old woman who is seen for evaluation for bilateral knee pain.  Patient has previously been followed with Dr. Ninfa Linden.  Patient states that she is lost 13 pounds in her current weight loss management.  Patient states she is trying to get her BMI below 40 to proceed with total knee arthroplasty.  She has had no relief with anti-inflammatories or topical anti-inflammatory gel.  She states she has had steroid and hyaluronic acid injections without relief.  Assessment & Plan: Visit Diagnoses:  1. Chronic pain of left knee   2. Chronic pain of right knee     Plan: Patient will continue with her weight loss program continue with exercise and strengthening.  Follow-up with Dr. Ninfa Linden in 4 weeks.  Follow-Up Instructions: No follow-ups on file.   Ortho Exam  Patient is alert, oriented, no adenopathy, well-dressed, normal affect, normal respiratory effort. Examination patient has an antalgic gait there is an effusion of both knees no redness or cellulitis there is crepitation with range of motion of both knees collaterals cruciates are stable.  Imaging: No results found. No images are attached to the encounter.  Labs: Lab Results  Component Value Date   HGBA1C 6.0 (H) 02/04/2022   ESRSEDRATE 38 (H) 09/25/2021   CRP 16.4 (H) 09/25/2021   LABURIC 7.4 (H) 09/25/2021     Lab Results  Component Value Date   ALBUMIN 4.3 02/04/2022   ALBUMIN 3.5 10/31/2013    No results found for: "MG" Lab Results  Component Value Date   VD25OH 32.8 02/04/2022    No results found for: "PREALBUMIN"    09/25/2021    1:15 PM  09/13/2015    5:34 PM 11/07/2014    9:10 PM  CBC EXTENDED  WBC WILL FOLLOW  P 8.7  9.2   RBC WILL FOLLOW  P 3.89  4.21   Hemoglobin WILL FOLLOW  P 10.8  11.8   HCT WILL FOLLOW  P 33.3  36.5   Platelets WILL FOLLOW  P 422  417   NEUT# WILL FOLLOW  P 5.8  4.3   Lymph# WILL FOLLOW  P 1.7  3.2     P Preliminary result     There is no height or weight on file to calculate BMI.  Orders:  No orders of the defined types were placed in this encounter.  No orders of the defined types were placed in this encounter.    Procedures: No procedures performed  Clinical Data: No additional findings.  ROS:  All other systems negative, except as noted in the HPI. Review of Systems  Objective: Vital Signs: LMP 01/04/2015   Specialty Comments:  No specialty comments available.  PMFS History: Patient Active Problem List   Diagnosis Date Noted   Prediabetes 02/06/2022   Other fatigue 02/04/2022   SOB (shortness of breath) on exertion 02/04/2022   Essential hypertension 02/04/2022   Health care maintenance 02/04/2022   Other hyperlipidemia 02/04/2022  Elevated glucose 02/04/2022   Vitamin D deficiency 02/04/2022   Class 3 severe obesity with serious comorbidity and body mass index (BMI) of 45.0 to 49.9 in adult ([place]) 02/04/2022   Unilateral primary osteoarthritis, right knee 12/16/2021   History of total left knee replacement 12/16/2021   Obstructive sleep apnea syndrome 03/05/2021   Stress incontinence of urine 03/05/2021   Mixed hyperlipidemia 09/01/2019   Chronic anemia 06/13/2019   DDD (degenerative disc disease), cervical 06/13/2019   DDD (degenerative disc disease), lumbosacral 06/13/2019   Peripheral edema 06/13/2019   Gastroesophageal reflux disease 06/13/2019   History of colon polyps 06/13/2019   Mild intermittent asthma 06/13/2019   Lower back pain 01/12/2015   Neck pain 10/05/2014   Left arm pain 10/05/2014   Dysesthesia 10/05/2014   Shoulder pain, left  10/05/2014   Left knee pain 08/02/2012   Right wrist pain 02/09/2012   Left leg pain 11/17/2011   Past Medical History:  Diagnosis Date   Back pain    Chronic fatigue syndrome    Depression    High cholesterol    Hypertension    Joint pain    Joint pain    Leg pain    Liver disease    Nerves    Sleep apnea    Swelling of lower extremity    Vision abnormalities     Family History  Problem Relation Age of Onset   Hypertension Mother    Heart disease Mother    Diabetes Father    Hyperlipidemia Other    Heart attack Neg Hx    Sudden death Neg Hx     Past Surgical History:  Procedure Laterality Date   BREAST SURGERY     INCONTINENCE SURGERY     KNEE SURGERY     Social History   Occupational History   Not on file  Tobacco Use   Smoking status: Never   Smokeless tobacco: Not on file  Substance and Sexual Activity   Alcohol use: No   Drug use: No   Sexual activity: Not on file

## 2022-04-15 ENCOUNTER — Ambulatory Visit: Payer: 59 | Admitting: Orthopaedic Surgery

## 2022-04-15 NOTE — Progress Notes (Unsigned)
Chief Complaint:   OBESITY Kristen Woods is here to discuss her progress with her obesity treatment plan along with follow-up of her obesity related diagnoses. Kristen Woods is on the Category 3 Plan and states she is following her eating plan approximately 75% of the time. Kristen Woods states she is walks at [employer].  Today's visit was #: 4 Starting weight: 300 lbs Starting date: 02/04/2022 Today's weight: 288 lbs Today's date: 03/26/2022 Total lbs lost to date: 12 lbs Total lbs lost since last in-office visit: 3  Interim History: Kristen Woods has done well with weight lost since her last visit. Aiming to eat more protein and avoids fried foods. Doing more chair exercises and drinking more water. Goal is to lose more weight to have knee surgery. Drinks one protein shake daily, tea and water.  Subjective:   1. Essential hypertension Kristen Woods is taking Lisinopril HCT 20-25 mg. Denies any side effects.Recently saw PCP.  Assessment/Plan:   1. Essential hypertension Continue to follow up with PCP. Continue medications as directed.  2. Obesity, Current BMI 46.5 Kristen Woods is currently in the action stage of change. As such, her goal is to continue with weight loss efforts. She has agreed to the Category 3 Plan.   Kristen Woods will change to lactose free shakes due to bloating with ***.  Exercise goals: As is.  Behavioral modification strategies: increasing lean protein intake, increasing water intake, and holiday eating strategies .  Kristen Woods has agreed to follow-up with our clinic in 4 weeks. She was informed of the importance of frequent follow-up visits to maximize her success with intensive lifestyle modifications for her multiple health conditions.   Objective:   Blood pressure 134/88, pulse 71, temperature 98 F (36.7 C), temperature source Oral, height '5\' 6"'$  (1.676 m), weight 288 lb (130.6 kg), last menstrual period 01/04/2015, SpO2 96 %. Body mass index is 46.48  kg/m.  General: Cooperative, alert, well developed, in no acute distress. HEENT: Conjunctivae and lids unremarkable. Cardiovascular: Regular rhythm.  Lungs: Normal work of breathing. Neurologic: No focal deficits.   Lab Results  Component Value Date   CREATININE 1.02 (H) 02/04/2022   BUN 10 02/04/2022   NA 141 02/04/2022   K 4.2 02/04/2022   CL 103 02/04/2022   CO2 23 02/04/2022   Lab Results  Component Value Date   ALT 18 02/04/2022   AST 18 02/04/2022   ALKPHOS 130 (H) 02/04/2022   BILITOT 0.2 02/04/2022   Lab Results  Component Value Date   HGBA1C 6.0 (H) 02/04/2022   Lab Results  Component Value Date   INSULIN 13.4 02/04/2022   Lab Results  Component Value Date   TSH 2.190 02/04/2022   Lab Results  Component Value Date   CHOL 209 (H) 02/04/2022   HDL 67 02/04/2022   LDLCALC 122 (H) 02/04/2022   TRIG 111 02/04/2022   Lab Results  Component Value Date   VD25OH 32.8 02/04/2022   Lab Results  Component Value Date   WBC WILL FOLLOW 09/25/2021   HGB WILL FOLLOW 09/25/2021   HCT WILL FOLLOW 09/25/2021   MCV WILL FOLLOW 09/25/2021   PLT WILL FOLLOW 09/25/2021   No results found for: "IRON", "TIBC", "FERRITIN"  Attestation Statements:   Reviewed by clinician on day of visit: allergies, medications, problem list, medical history, surgical history, family history, social history, and previous encounter notes.  I, Brendell Tyus, RMA, am acting as transcriptionist for Everardo Pacific, FNP.  I have reviewed the above documentation for accuracy and  completeness, and I agree with the above. -  ***

## 2022-04-16 ENCOUNTER — Ambulatory Visit: Payer: 59 | Admitting: Orthopaedic Surgery

## 2022-04-22 ENCOUNTER — Ambulatory Visit (INDEPENDENT_AMBULATORY_CARE_PROVIDER_SITE_OTHER): Payer: 59 | Admitting: Nurse Practitioner

## 2022-04-22 ENCOUNTER — Encounter: Payer: Self-pay | Admitting: Nurse Practitioner

## 2022-04-22 VITALS — BP 112/70 | HR 70 | Temp 98.5°F | Ht 66.0 in | Wt 295.0 lb

## 2022-04-22 DIAGNOSIS — M25562 Pain in left knee: Secondary | ICD-10-CM | POA: Diagnosis not present

## 2022-04-22 DIAGNOSIS — M25561 Pain in right knee: Secondary | ICD-10-CM | POA: Diagnosis not present

## 2022-04-22 DIAGNOSIS — E669 Obesity, unspecified: Secondary | ICD-10-CM | POA: Diagnosis not present

## 2022-04-22 DIAGNOSIS — G8929 Other chronic pain: Secondary | ICD-10-CM

## 2022-04-22 DIAGNOSIS — Z6841 Body Mass Index (BMI) 40.0 and over, adult: Secondary | ICD-10-CM | POA: Diagnosis not present

## 2022-04-22 DIAGNOSIS — R7303 Prediabetes: Secondary | ICD-10-CM | POA: Diagnosis not present

## 2022-04-22 NOTE — Patient Instructions (Signed)

## 2022-04-24 NOTE — Progress Notes (Deleted)
Office Visit Note  Patient: Kristen Woods             Date of Birth: 10/23/1965           MRN: [id number]             PCP: Dorna Mai, MD Referring: Jessy Oto, MD Visit Date: 04/28/2022 Occupation: '@GUAROCC' @  Subjective:  No chief complaint on file.   History of Present Illness: Kristen Woods is a 57 y.o. female ***     Activities of Daily Living:  Patient reports morning stiffness for *** {minute/hour:19697}.   Patient {ACTIONS;DENIES/REPORTS:21021675::"Denies"} nocturnal pain.  Difficulty dressing/grooming: {ACTIONS;DENIES/REPORTS:21021675::"Denies"} Difficulty climbing stairs: {ACTIONS;DENIES/REPORTS:21021675::"Denies"} Difficulty getting out of chair: {ACTIONS;DENIES/REPORTS:21021675::"Denies"} Difficulty using hands for taps, buttons, cutlery, and/or writing: {ACTIONS;DENIES/REPORTS:21021675::"Denies"}  No Rheumatology ROS completed.   PMFS History:  Patient Active Problem List   Diagnosis Date Noted   Prediabetes 02/06/2022   Other fatigue 02/04/2022   SOB (shortness of breath) on exertion 02/04/2022   Essential hypertension 02/04/2022   Health care maintenance 02/04/2022   Other hyperlipidemia 02/04/2022   Elevated glucose 02/04/2022   Vitamin D deficiency 02/04/2022   Class 3 severe obesity with serious comorbidity and body mass index (BMI) of 45.0 to 49.9 in adult ([place]) 02/04/2022   Unilateral primary osteoarthritis, right knee 12/16/2021   History of total left knee replacement 12/16/2021   Obstructive sleep apnea syndrome 03/05/2021   Stress incontinence of urine 03/05/2021   Mixed hyperlipidemia 09/01/2019   Chronic anemia 06/13/2019   DDD (degenerative disc disease), cervical 06/13/2019   DDD (degenerative disc disease), lumbosacral 06/13/2019   Peripheral edema 06/13/2019   Gastroesophageal reflux disease 06/13/2019   History of colon polyps 06/13/2019   Mild intermittent asthma 06/13/2019   Lower back pain 01/12/2015   Neck pain  10/05/2014   Left arm pain 10/05/2014   Dysesthesia 10/05/2014   Shoulder pain, left 10/05/2014   Left knee pain 08/02/2012   Right wrist pain 02/09/2012   Left leg pain 11/17/2011    Past Medical History:  Diagnosis Date   Back pain    Chronic fatigue syndrome    Depression    High cholesterol    Hypertension    Joint pain    Joint pain    Leg pain    Liver disease    Nerves    Sleep apnea    Swelling of lower extremity    Vision abnormalities     Family History  Problem Relation Age of Onset   Hypertension Mother    Heart disease Mother    Diabetes Father    Hyperlipidemia Other    Heart attack Neg Hx    Sudden death Neg Hx    Past Surgical History:  Procedure Laterality Date   BREAST SURGERY     INCONTINENCE SURGERY     KNEE SURGERY     Social History   Social History Narrative   Not on file   Immunization History  Administered Date(s) Administered   Moderna Sars-Covid-2 Vaccination 08/12/2019, 09/09/2019   Pfizer Covid-19 Environmental manager Booster 55yr & up 03/23/2021   Tdap 05/15/2014, 04/06/2021   Zoster Recombinat (Shingrix) 03/23/2021, 03/19/2022     Objective: Vital Signs: LMP 01/04/2015    Physical Exam   Musculoskeletal Exam: ***  CDAI Exam: CDAI Score: -- Patient Global: --; Provider Global: -- Swollen: --; Tender: -- Joint Exam 04/28/2022   No joint exam has been documented for this visit   There is currently no information documented on  the homunculus. Go to the Rheumatology activity and complete the homunculus joint exam.  Investigation: No additional findings.  Imaging: No results found.  Recent Labs: Lab Results  Component Value Date   WBC WILL FOLLOW 09/25/2021   HGB WILL FOLLOW 09/25/2021   PLT WILL FOLLOW 09/25/2021   NA 141 02/04/2022   K 4.2 02/04/2022   CL 103 02/04/2022   CO2 23 02/04/2022   GLUCOSE 88 02/04/2022   BUN 10 02/04/2022   CREATININE 1.02 (H) 02/04/2022   BILITOT 0.2 02/04/2022   ALKPHOS 130  (H) 02/04/2022   AST 18 02/04/2022   ALT 18 02/04/2022   PROT 7.0 02/04/2022   ALBUMIN 4.3 02/04/2022   CALCIUM 9.6 02/04/2022   GFRAA 50 (L) 09/13/2015    Speciality Comments: No specialty comments available.  Procedures:  No procedures performed Allergies: Patient has no known allergies.   Assessment / Plan:     Visit Diagnoses: Positive ANA (antinuclear antibody) - 09/25/21: ANA+ 1:80 speckled, CRP 16.4, ESR 38, uric acid 7.4, RF<14  Unilateral primary osteoarthritis, right knee  History of total left knee replacement  DDD (degenerative disc disease), cervical  Sacroiliac joint dysfunction of both sides  Other spondylosis with radiculopathy, lumbar region  DDD (degenerative disc disease), lumbosacral  NSAID long-term use  Essential hypertension  History of asthma  Obstructive sleep apnea syndrome  History of gastroesophageal reflux (GERD)  Chronic anemia  History of colon polyps  Mixed hyperlipidemia  Vitamin D deficiency  Prediabetes  Orders: No orders of the defined types were placed in this encounter.  No orders of the defined types were placed in this encounter.   Face-to-face time spent with patient was *** minutes. Greater than 50% of time was spent in counseling and coordination of care.  Follow-Up Instructions: No follow-ups on file.   Ofilia Neas, PA-C  Note - This record has been created using Dragon software.  Chart creation errors have been sought, but may not always  have been located. Such creation errors do not reflect on  the standard of medical care.

## 2022-04-28 ENCOUNTER — Ambulatory Visit: Payer: Commercial Managed Care - HMO | Attending: Rheumatology | Admitting: Rheumatology

## 2022-04-28 ENCOUNTER — Telehealth: Payer: Self-pay | Admitting: Orthopaedic Surgery

## 2022-04-28 DIAGNOSIS — G4733 Obstructive sleep apnea (adult) (pediatric): Secondary | ICD-10-CM

## 2022-04-28 DIAGNOSIS — E782 Mixed hyperlipidemia: Secondary | ICD-10-CM

## 2022-04-28 DIAGNOSIS — Z86010 Personal history of colonic polyps: Secondary | ICD-10-CM

## 2022-04-28 DIAGNOSIS — M5137 Other intervertebral disc degeneration, lumbosacral region: Secondary | ICD-10-CM

## 2022-04-28 DIAGNOSIS — E559 Vitamin D deficiency, unspecified: Secondary | ICD-10-CM

## 2022-04-28 DIAGNOSIS — R768 Other specified abnormal immunological findings in serum: Secondary | ICD-10-CM

## 2022-04-28 DIAGNOSIS — I10 Essential (primary) hypertension: Secondary | ICD-10-CM

## 2022-04-28 DIAGNOSIS — D649 Anemia, unspecified: Secondary | ICD-10-CM

## 2022-04-28 DIAGNOSIS — Z791 Long term (current) use of non-steroidal anti-inflammatories (NSAID): Secondary | ICD-10-CM

## 2022-04-28 DIAGNOSIS — Z8709 Personal history of other diseases of the respiratory system: Secondary | ICD-10-CM

## 2022-04-28 DIAGNOSIS — Z8719 Personal history of other diseases of the digestive system: Secondary | ICD-10-CM

## 2022-04-28 DIAGNOSIS — R7303 Prediabetes: Secondary | ICD-10-CM

## 2022-04-28 DIAGNOSIS — M4726 Other spondylosis with radiculopathy, lumbar region: Secondary | ICD-10-CM

## 2022-04-28 DIAGNOSIS — Z96652 Presence of left artificial knee joint: Secondary | ICD-10-CM

## 2022-04-28 DIAGNOSIS — M533 Sacrococcygeal disorders, not elsewhere classified: Secondary | ICD-10-CM

## 2022-04-28 DIAGNOSIS — M5030 Other cervical disc degeneration, unspecified cervical region: Secondary | ICD-10-CM

## 2022-04-28 DIAGNOSIS — M1711 Unilateral primary osteoarthritis, right knee: Secondary | ICD-10-CM

## 2022-04-28 NOTE — Telephone Encounter (Signed)
I called pt and advised her

## 2022-04-28 NOTE — Telephone Encounter (Signed)
Noted new referral placed in chart for Dr. Amil Amen and [facility]

## 2022-04-28 NOTE — Telephone Encounter (Signed)
Pt called requesting a new referral for a Rhuemotoligts. Pt states she had a referral for Dr Brett Canales and did not remember she had an appt. [facility] office states she need a new referral. Please call pt about this matter at [phone].

## 2022-04-28 NOTE — Telephone Encounter (Signed)
Patient called our office this morning to reschedule after her scheduled appointment time. Patient was advised that she will need to be referred somewhere else and we cannot reschedule due to Dr. Arlean Hopping no-show policy. Patient also answered our reminder phone call a few days ago.

## 2022-04-28 NOTE — Telephone Encounter (Signed)
I saw the referral was closed due to a no-show today. Can the pt still be seen at your office or do we need to refer pt. Somewhere else?

## 2022-04-29 NOTE — Progress Notes (Signed)
Chief Complaint:   OBESITY Kristen Woods is here to discuss her progress with her obesity treatment plan along with follow-up of her obesity related diagnoses. Kristen Woods is on the Category 3 Plan and states she is following her eating plan approximately 80% of the time. Kristen Woods states she is chair and bed exercises 20 minutes 3 times per week.  Today's visit was #: 5 Starting weight: 300 lbs Starting date: 02/04/2022 Today's weight: 295 lbs Today's date: 04/22/2022 Total lbs lost to date: 5 lbs Total lbs lost since last in-office visit: 0  Interim History: Nimrat has gotten off track. Moved in recently with her brother and is having a hard time following the plan because she cannot cook and she would like to.  Notes some stress eating.  Hoping to move into a new place in the next 2 months.  Subjective:   1. Chronic pain of both knees Kristen Woods saw Ortho last on 04/10/2022.  Goal is to lose weight to have a total knee arthroplasty.  2. Pre-diabetes Kristen Woods last A1c was 6.0.  Never been on medication.  Struggles with polyphagia and cravings.  Family history; father (DMT2)  Assessment/Plan:   1. Chronic pain of both knees Continue to follow-up with Ortho.  2. Pre-diabetes Information given on prediabetes and metformin.  Will recheck A1c at next visit. Kristen Woods will continue to work on weight loss, exercise, and decreasing simple carbohydrates to help decrease the risk of diabetes.    3. Obesity, Current BMI 47.7 Kristen Woods is currently in the action stage of change. As such, her goal is to continue with weight loss efforts. She has agreed to the Category 3 Plan.   Will check labs at next visit. A1c, CMP and Vit B12.  Exercise goals: All adults should avoid inactivity. Some physical activity is better than none, and adults who participate in any amount of physical activity gain some health benefits.  Behavioral modification strategies: increasing lean protein intake, increasing  water intake, and meal planning and cooking strategies.  Kristen Woods has agreed to follow-up with our clinic in 4 weeks. She was informed of the importance of frequent follow-up visits to maximize her success with intensive lifestyle modifications for her multiple health conditions.   Objective:   Blood pressure 112/70, pulse 70, temperature 98.5 F (36.9 C), height '5\' 6"'$  (1.676 m), weight 295 lb (133.8 kg), last menstrual period 01/04/2015, SpO2 97 %. Body mass index is 47.61 kg/m.  General: Cooperative, alert, well developed, in no acute distress. HEENT: Conjunctivae and lids unremarkable. Cardiovascular: Regular rhythm.  Lungs: Normal work of breathing. Neurologic: No focal deficits.   Lab Results  Component Value Date   CREATININE 1.02 (H) 02/04/2022   BUN 10 02/04/2022   NA 141 02/04/2022   K 4.2 02/04/2022   CL 103 02/04/2022   CO2 23 02/04/2022   Lab Results  Component Value Date   ALT 18 02/04/2022   AST 18 02/04/2022   ALKPHOS 130 (H) 02/04/2022   BILITOT 0.2 02/04/2022   Lab Results  Component Value Date   HGBA1C 6.0 (H) 02/04/2022   Lab Results  Component Value Date   INSULIN 13.4 02/04/2022   Lab Results  Component Value Date   TSH 2.190 02/04/2022   Lab Results  Component Value Date   CHOL 209 (H) 02/04/2022   HDL 67 02/04/2022   LDLCALC 122 (H) 02/04/2022   TRIG 111 02/04/2022   Lab Results  Component Value Date   VD25OH 32.8 02/04/2022  Lab Results  Component Value Date   WBC WILL FOLLOW 09/25/2021   HGB WILL FOLLOW 09/25/2021   HCT WILL FOLLOW 09/25/2021   MCV WILL FOLLOW 09/25/2021   PLT WILL FOLLOW 09/25/2021   No results found for: "IRON", "TIBC", "FERRITIN"  Attestation Statements:   Reviewed by clinician on day of visit: allergies, medications, problem list, medical history, surgical history, family history, social history, and previous encounter notes.  Time spent on visit including pre-visit chart review and post-visit care  and charting was 30 minutes.   I, Brendell Tyus, RMA, am acting as transcriptionist for Everardo Pacific, FNP.  I have reviewed the above documentation for accuracy and completeness, and I agree with the above. Everardo Pacific, FNP

## 2022-05-08 ENCOUNTER — Ambulatory Visit: Payer: Self-pay | Admitting: Orthopaedic Surgery

## 2022-05-20 ENCOUNTER — Ambulatory Visit: Payer: 59 | Admitting: Rheumatology

## 2022-05-21 ENCOUNTER — Ambulatory Visit
Admission: EM | Admit: 2022-05-21 | Discharge: 2022-05-21 | Disposition: A | Discharge status: Home / Self Care | Payer: 59 | Attending: Physician Assistant | Admitting: Physician Assistant

## 2022-05-21 ENCOUNTER — Ambulatory Visit: Payer: Self-pay

## 2022-05-21 DIAGNOSIS — J209 Acute bronchitis, unspecified: Secondary | ICD-10-CM

## 2022-05-21 DIAGNOSIS — J069 Acute upper respiratory infection, unspecified: Secondary | ICD-10-CM | POA: Diagnosis not present

## 2022-05-21 DIAGNOSIS — R062 Wheezing: Secondary | ICD-10-CM | POA: Diagnosis not present

## 2022-05-21 MED ORDER — ALBUTEROL SULFATE (2.5 MG/3ML) 0.083% IN NEBU
2.5000 mg | INHALATION_SOLUTION | Freq: Once | RESPIRATORY_TRACT | Status: AC
  Administered 2022-05-21: 2.5 mg via RESPIRATORY_TRACT

## 2022-05-21 MED ORDER — TRIAMCINOLONE ACETONIDE 40 MG/ML IJ SUSP
40.0000 mg | Freq: Once | INTRAMUSCULAR | Status: AC
  Administered 2022-05-21: 40 mg via INTRAMUSCULAR

## 2022-05-21 MED ORDER — PREDNISONE 10 MG PO TABS: 10.0000 mg | ORAL_TABLET | Freq: Three times a day (TID) | ORAL | 0 refills | Status: DC

## 2022-05-21 MED ORDER — SPACER/AERO-HOLDING CHAMBERS DEVI: 1.0000 [IU] | Freq: Three times a day (TID) | 0 refills | Status: AC

## 2022-05-21 MED ORDER — AZITHROMYCIN 250 MG PO TABS: ORAL_TABLET | ORAL | 0 refills | Status: DC

## 2022-05-21 MED ORDER — ALBUTEROL SULFATE HFA 108 (90 BASE) MCG/ACT IN AERS: 2.0000 | INHALATION_SPRAY | Freq: Four times a day (QID) | RESPIRATORY_TRACT | 0 refills | Status: AC | PRN

## 2022-05-21 MED ORDER — DM-GUAIFENESIN ER 30-600 MG PO TB12: 1.0000 | ORAL_TABLET | Freq: Two times a day (BID) | ORAL | 0 refills | Status: DC

## 2022-05-21 NOTE — ED Triage Notes (Signed)
Pt c/o head cold, wheezing, feeling cold   Onset ~ 3 days ago   Denies pain in triage   Checked BP twice. States did not take HTN medication today. States this is a low reading for her.

## 2022-05-21 NOTE — Telephone Encounter (Signed)
   Chief Complaint: Cough with wheezing Symptoms: Above  Frequency: 3 days ago Pertinent Negatives: Patient denies fever Disposition: '[]'$ ED /'[x]'$ Urgent Care (no appt availability in office) / '[]'$ Appointment(In office/virtual)/ '[]'$  [facility]/ '[]'$ Home Care/ '[]'$ Refused Recommended Disposition /'[]'$ [facility]/ '[]'$  Follow-up with PCP Additional Notes:    Reason for Disposition  [1] MILD difficulty breathing (e.g., minimal/no SOB at rest, SOB with walking, pulse <100) AND [2] still present when not coughing  Answer Assessment - Initial Assessment Questions 1. ONSET: "When did the cough begin?"      3 days ago 2. SEVERITY: "How bad is the cough today?"      Severe 3. SPUTUM: "Describe the color of your sputum" (none, dry cough; clear, white, yellow, green)     No 4. HEMOPTYSIS: "Are you coughing up any blood?" If so ask: "How much?" (flecks, streaks, tablespoons, etc.)     No 5. DIFFICULTY BREATHING: "Are you having difficulty breathing?" If Yes, ask: "How bad is it?" (e.g., mild, moderate, severe)    - MILD: No SOB at rest, mild SOB with walking, speaks normally in sentences, can lie down, no retractions, pulse < 100.    - MODERATE: SOB at rest, SOB with minimal exertion and prefers to sit, cannot lie down flat, speaks in phrases, mild retractions, audible wheezing, pulse 100-120.    - SEVERE: Very SOB at rest, speaks in single words, struggling to breathe, sitting hunched forward, retractions, pulse > 120      No 6. FEVER: "Do you have a fever?" If Yes, ask: "What is your temperature, how was it measured, and when did it start?"     No 7. CARDIAC HISTORY: "Do you have any history of heart disease?" (e.g., heart attack, congestive heart failure)      No 8. LUNG HISTORY: "Do you have any history of lung disease?"  (e.g., pulmonary embolus, asthma, emphysema)     Asthma 9. PE RISK FACTORS: "Do you have a history of blood clots?" (or: recent major surgery, recent prolonged  travel, bedridden)     No 10. OTHER SYMPTOMS: "Do you have any other symptoms?" (e.g., runny nose, wheezing, chest pain)       Wheezing 11. PREGNANCY: "Is there any chance you are pregnant?" "When was your last menstrual period?"       No 12. TRAVEL: "Have you traveled out of the country in the last month?" (e.g., travel history, exposures)       No  Protocols used: Cough - Acute Non-Productive-A-AH

## 2022-05-21 NOTE — ED Provider Notes (Signed)
[facility]    CSN: [id number] Arrival date & time: 05/21/22  1537      History   Chief Complaint Chief Complaint  Patient presents with   head cold    HPI Kristen Woods is a 57 y.o. female.   57 year old female presents with cough, congestion and wheezing.  Patient indicates for the past 3 days she has been having increasing episodes of upper respiratory congestion with sinus congestion, rhinitis, postnasal drip.  Patient indicates she has been having chest congestion with intermittent cough, wheezing, and shortness of breath with coughing spasms and episodes.  Patient indicates that she does not have fever, chills, but relates she does have a history of having asthma.  Patient indicates that she does not have an albuterol inhaler because it has been a long time since she has had any wheezing episodes.  She indicates that she has had this respiratory infection she is having a mild exacerbation of her asthma with her shortness of breath and wheezing.  The patient is tolerating fluids well.     Past Medical History:  Diagnosis Date   Back pain    Chronic fatigue syndrome    Depression    High cholesterol    Hypertension    Joint pain    Joint pain    Leg pain    Liver disease    Nerves    Sleep apnea    Swelling of lower extremity    Vision abnormalities     Patient Active Problem List   Diagnosis Date Noted   Prediabetes 02/06/2022   Other fatigue 02/04/2022   SOB (shortness of breath) on exertion 02/04/2022   Essential hypertension 02/04/2022   Health care maintenance 02/04/2022   Other hyperlipidemia 02/04/2022   Elevated glucose 02/04/2022   Vitamin D deficiency 02/04/2022   Class 3 severe obesity with serious comorbidity and body mass index (BMI) of 45.0 to 49.9 in adult ([place]) 02/04/2022   Unilateral primary osteoarthritis, right knee 12/16/2021   History of total left knee replacement 12/16/2021   Obstructive sleep apnea syndrome 03/05/2021    Stress incontinence of urine 03/05/2021   Mixed hyperlipidemia 09/01/2019   Chronic anemia 06/13/2019   DDD (degenerative disc disease), cervical 06/13/2019   DDD (degenerative disc disease), lumbosacral 06/13/2019   Peripheral edema 06/13/2019   Gastroesophageal reflux disease 06/13/2019   History of colon polyps 06/13/2019   Mild intermittent asthma 06/13/2019   Lower back pain 01/12/2015   Neck pain 10/05/2014   Left arm pain 10/05/2014   Dysesthesia 10/05/2014   Shoulder pain, left 10/05/2014   Left knee pain 08/02/2012   Right wrist pain 02/09/2012   Left leg pain 11/17/2011    Past Surgical History:  Procedure Laterality Date   BREAST SURGERY     INCONTINENCE SURGERY     KNEE SURGERY      OB History     Gravida  6   Para      Term      Preterm      AB      Living         SAB      IAB      Ectopic      Multiple      Live Births               Home Medications    Prior to Admission medications   Medication Sig Start Date End Date Taking? Authorizing Provider  albuterol (VENTOLIN HFA)  108 (90 Base) MCG/ACT inhaler Inhale 2 puffs into the lungs every 6 (six) hours as needed for wheezing or shortness of breath. 05/21/22  Yes Nyoka Lint, PA-C  azithromycin (ZITHROMAX Z-PAK) 250 MG tablet 2 tablets initially and then 1 tablet daily until completed. 05/21/22  Yes Nyoka Lint, PA-C  dextromethorphan-guaiFENesin [facility] DM) 30-600 MG 12hr tablet Take 1 tablet by mouth 2 (two) times daily. 05/21/22  Yes Nyoka Lint, PA-C  predniSONE (DELTASONE) 10 MG tablet Take 1 tablet (10 mg total) by mouth in the morning, at noon, and at bedtime. 05/21/22  Yes Nyoka Lint, PA-C  Spacer/Aero-Holding Chambers DEVI 1 Units by Does not apply route in the morning, at noon, and at bedtime. 05/21/22  Yes Nyoka Lint, PA-C  diclofenac Sodium (VOLTAREN) 1 % GEL Apply 2 g topically 4 (four) times daily as needed. 03/20/22   Long, Wonda Olds, MD  gabapentin (NEURONTIN) 300 MG  capsule Take 1 capsule (300 mg total) by mouth 3 (three) times daily. 03/19/22   Dorna Mai, MD  lisinopril-hydrochlorothiazide (ZESTORETIC) 20-25 MG tablet Take 1 tablet by mouth daily. 03/19/22   Dorna Mai, MD  methocarbamol (ROBAXIN) 500 MG tablet Take 1 tablet (500 mg total) by mouth every 8 (eight) hours as needed for muscle spasms. 03/20/22   Long, Wonda Olds, MD  montelukast (SINGULAIR) 10 MG tablet Take 1 tablet (10 mg total) by mouth at bedtime. 03/19/22   Dorna Mai, MD  omeprazole (PRILOSEC) 40 MG capsule Take 1 capsule (40 mg total) by mouth daily. 03/19/22   Dorna Mai, MD    Family History Family History  Problem Relation Age of Onset   Hypertension Mother    Heart disease Mother    Diabetes Father    Hyperlipidemia Other    Heart attack Neg Hx    Sudden death Neg Hx     Social History Social History   Tobacco Use   Smoking status: Never  Substance Use Topics   Alcohol use: No   Drug use: No     Allergies   Patient has no known allergies.   Review of Systems Review of Systems  HENT:  Positive for postnasal drip, rhinorrhea, sinus pressure and sinus pain.   Respiratory:  Positive for cough, shortness of breath and wheezing.      Physical Exam Triage Vital Signs ED Triage Vitals  Enc Vitals Group     BP 05/21/22 1606 (!) 163/109     Pulse Rate 05/21/22 1606 87     Resp 05/21/22 1606 18     Temp 05/21/22 1606 98.3 F (36.8 C)     Temp Source 05/21/22 1606 Oral     SpO2 05/21/22 1607 97 %     Weight --      Height --      Head Circumference --      Peak Flow --      Pain Score 05/21/22 1607 0     Pain Loc --      Pain Edu? --      Excl. in [place]? --    No data found.  Updated Vital Signs BP (!) 163/109 (BP Location: Left Arm)   Pulse 87   Temp 98.3 F (36.8 C) (Oral)   Resp 18   LMP 01/04/2015   SpO2 97%   Visual Acuity Right Eye Distance:   Left Eye Distance:   Bilateral Distance:    Right Eye Near:   Left Eye Near:     Bilateral Near:  Physical Exam Constitutional:      Appearance: Normal appearance.  HENT:     Right Ear: Tympanic membrane and ear canal normal.     Left Ear: Tympanic membrane and ear canal normal.     Mouth/Throat:     Mouth: Mucous membranes are moist.     Pharynx: Oropharynx is clear.  Cardiovascular:     Rate and Rhythm: Normal rate and regular rhythm.     Heart sounds: Normal heart sounds.  Pulmonary:     Effort: Pulmonary effort is normal.     Breath sounds: Normal air entry. Examination of the right-lower field reveals wheezing and rhonchi. Examination of the left-lower field reveals wheezing and rhonchi. Wheezing (mild with expiration bilaterally) and rhonchi (mild bilaterally.) present.  Lymphadenopathy:     Cervical: No cervical adenopathy.  Neurological:     Mental Status: She is alert.      UC Treatments / Results  Labs (all labs ordered are listed, but only abnormal results are displayed) Labs Reviewed - No data to display  EKG   Radiology No results found.  Procedures Procedures (including critical care time)  Medications Ordered in UC Medications  albuterol (PROVENTIL) (2.5 MG/3ML) 0.083% nebulizer solution 2.5 mg (2.5 mg Nebulization Given 05/21/22 1633)  triamcinolone acetonide (KENALOG-40) injection 40 mg (40 mg Intramuscular Given 05/21/22 1633)    Initial Impression / Assessment and Plan / UC Course  I have reviewed the triage vital signs and the nursing notes.  Pertinent labs & imaging results that were available during my care of the patient were reviewed by me and considered in my medical decision making (see chart for details).    Plan: The diagnosis will be treated with the following: 1.  Wheezing: A.  Albuterol 0.083% nebulizer treatment given in the office today. B.  Kenalog 40 mg given IM. 2.  Acute bronchitis: A.  Albuterol inhaler with spacer, 2 puffs every 6 hours on a regular basis to decrease wheezing and congestion. B.   Prednisone 10 mg 3 times a day for 5 days only to help reduce wheezing and shortness of breath. C.  Mucinex DM every 12 hours to help decrease congestion. 3.  Acute upper respiratory infection: A.  Mucinex DM every 12 hours to help decrease congestion and cough. 4.  Patient advised follow-up PCP or return to urgent care if symptoms fail to improve.  Final Clinical Impressions(s) / UC Diagnoses   Final diagnoses:  Acute upper respiratory infection  Acute bronchitis, unspecified organism  Wheezing     Discharge Instructions      Advised to use the albuterol inhaler with spacer, 2 puffs every 6 hours on a regular basis to help decrease cough, wheezing and shortness of breath. Advised take prednisone 10 mg 3 times a day for 5 days only to help reduce wheezing and shortness of breath. Advised to take the Mucinex DM every 12 hours to help decrease cough and chest congestion. Advised take the Zithromax 250 mg, 2 tablets today and then 1 tablet daily until completed as this will help treat infection.  Advised to increase fluid intake, as this will help decrease and thin secretions.  Advised follow-up PCP or return to urgent care if symptoms fail to improve.    ED Prescriptions     Medication Sig Dispense Auth. Provider   albuterol (VENTOLIN HFA) 108 (90 Base) MCG/ACT inhaler Inhale 2 puffs into the lungs every 6 (six) hours as needed for wheezing or shortness of breath. 8 g  Nyoka Lint, PA-C   Spacer/Aero-Holding Chambers DEVI 1 Units by Does not apply route in the morning, at noon, and at bedtime. 1 Units Nyoka Lint, PA-C   dextromethorphan-guaiFENesin [facility] DM) 30-600 MG 12hr tablet Take 1 tablet by mouth 2 (two) times daily. 20 tablet Nyoka Lint, PA-C   predniSONE (DELTASONE) 10 MG tablet Take 1 tablet (10 mg total) by mouth in the morning, at noon, and at bedtime. 15 tablet Nyoka Lint, PA-C   azithromycin (ZITHROMAX Z-PAK) 250 MG tablet 2 tablets initially and then 1 tablet  daily until completed. 6 each Nyoka Lint, PA-C      PDMP not reviewed this encounter.   Nyoka Lint, PA-C 05/21/22 1644

## 2022-05-21 NOTE — Discharge Instructions (Signed)
Advised to use the albuterol inhaler with spacer, 2 puffs every 6 hours on a regular basis to help decrease cough, wheezing and shortness of breath. Advised take prednisone 10 mg 3 times a day for 5 days only to help reduce wheezing and shortness of breath. Advised to take the Mucinex DM every 12 hours to help decrease cough and chest congestion. Advised take the Zithromax 250 mg, 2 tablets today and then 1 tablet daily until completed as this will help treat infection.  Advised to increase fluid intake, as this will help decrease and thin secretions.  Advised follow-up PCP or return to urgent care if symptoms fail to improve.

## 2022-05-22 ENCOUNTER — Ambulatory Visit: Payer: 59 | Admitting: Nurse Practitioner

## 2022-05-22 ENCOUNTER — Telehealth (INDEPENDENT_AMBULATORY_CARE_PROVIDER_SITE_OTHER): Payer: 59 | Admitting: Family Medicine

## 2022-05-22 ENCOUNTER — Encounter (INDEPENDENT_AMBULATORY_CARE_PROVIDER_SITE_OTHER): Payer: Self-pay | Admitting: Family Medicine

## 2022-05-22 DIAGNOSIS — F5089 Other specified eating disorder: Secondary | ICD-10-CM

## 2022-05-22 DIAGNOSIS — E669 Obesity, unspecified: Secondary | ICD-10-CM

## 2022-05-22 DIAGNOSIS — Z6841 Body Mass Index (BMI) 40.0 and over, adult: Secondary | ICD-10-CM

## 2022-05-22 DIAGNOSIS — R7303 Prediabetes: Secondary | ICD-10-CM | POA: Diagnosis not present

## 2022-05-22 DIAGNOSIS — F509 Eating disorder, unspecified: Secondary | ICD-10-CM | POA: Insufficient documentation

## 2022-05-22 NOTE — Progress Notes (Signed)
TeleHealth Visit:  This visit was completed with telemedicine (audio/video) technology. Kristen Woods has verbally consented to this TeleHealth visit. The patient is located at home, the provider is located at home. The participants in this visit include the listed provider and patient. The visit was conducted today via MyChart video.  OBESITY Kristen Woods is here to discuss her progress with her obesity treatment plan along with follow-up of her obesity related diagnoses.   Today's visit was # 6 Starting weight: 300 lbs Starting date: 02/04/2022 Weight at last in office visit: 295 lbs on 04/22/22 Total weight loss: 5 lbs at last in office visit on 04/22/22. Today's reported weight: 295 lbs   Nutrition Plan: the Category 3 Plan.   Current exercise:  chair and bed exercises 20 minutes 3 times per week.  Interim History:  Kristen Woods reports stress eating junk food has been  an issue recently but has always been an issue for her.  Her living situation is not ideal and she is trying to change it.  She is unable to cook at her current residence because of bug infestation. Is focusing on protein intake.  Generally has a weight loss tea for breakfast.  She also tries to have a protein shake daily.  Water intake is excellent.  No intake of sugar sweetened beverages.  Assessment/Plan:  1. Eating disorder/emotional eating Kristen Woods has had issues with stress/emotional eating. Currently this is poorly controlled. Overall mood is stable.  Medication(s): none  Plan: She would like to set up a visit with Dr. Mallie Mussel.  We will do this at next in-office visit so she can fill out new patient paperwork. Consider adding bupropion in the future.  2. Prediabetes Kristen Woods has a diagnosis of prediabetes based on her elevated HgA1c. Medication(s): none Lab Results  Component Value Date   HGBA1C 6.0 (H) 02/04/2022   Lab Results  Component Value Date   INSULIN 13.4 02/04/2022    Plan: Check labs next office  visit. Consider adding metformin.   3. Obesity: Current BMI 42 Kristen Woods is currently in the action stage of change. As such, her goal is to continue with weight loss efforts.  She has agreed to the Category 3 Plan.   1.  Discussed doing frozen meal or a meal from [employer] for dinner.  Keep calories less than 600 and try to get a least 35 g of protein. 2.  If skipping breakfast have a protein shake with at least 20 g of protein.  Exercise goals:  as is  Behavioral modification strategies: increasing lean protein intake, decreasing simple carbohydrates, no skipping meals, meal planning and cooking strategies, keeping healthy foods in the home, and planning for success.  Kristen Woods has agreed to follow-up with our clinic in 4 weeks, fasting.   No orders of the defined types were placed in this encounter.   There are no discontinued medications.   No orders of the defined types were placed in this encounter.     Objective:   VITALS: Per patient if applicable, see vitals. GENERAL: Alert and in no acute distress. CARDIOPULMONARY: No increased WOB. Speaking in clear sentences.  PSYCH: Pleasant and cooperative. Speech normal rate and rhythm. Affect is appropriate. Insight and judgement are appropriate. Attention is focused, linear, and appropriate.  NEURO: Oriented as arrived to appointment on time with no prompting.   Lab Results  Component Value Date   CREATININE 1.02 (H) 02/04/2022   BUN 10 02/04/2022   NA 141 02/04/2022   K 4.2 02/04/2022  CL 103 02/04/2022   CO2 23 02/04/2022   Lab Results  Component Value Date   ALT 18 02/04/2022   AST 18 02/04/2022   ALKPHOS 130 (H) 02/04/2022   BILITOT 0.2 02/04/2022   Lab Results  Component Value Date   HGBA1C 6.0 (H) 02/04/2022   Lab Results  Component Value Date   INSULIN 13.4 02/04/2022   Lab Results  Component Value Date   TSH 2.190 02/04/2022   Lab Results  Component Value Date   CHOL 209 (H) 02/04/2022   HDL 67  02/04/2022   LDLCALC 122 (H) 02/04/2022   TRIG 111 02/04/2022   Lab Results  Component Value Date   WBC WILL FOLLOW 09/25/2021   HGB WILL FOLLOW 09/25/2021   HCT WILL FOLLOW 09/25/2021   MCV WILL FOLLOW 09/25/2021   PLT WILL FOLLOW 09/25/2021   No results found for: "IRON", "TIBC", "FERRITIN" Lab Results  Component Value Date   VD25OH 32.8 02/04/2022    Attestation Statements:   Reviewed by clinician on day of visit: allergies, medications, problem list, medical history, surgical history, family history, social history, and previous encounter notes.   Time spent on visit including the items listed below was 30 minutes.  -preparing to see the patient (e.g., review of tests, history, previous notes) -obtaining and/or reviewing separately obtained history -counseling and educating the patient/family/caregiver -documenting clinical information in the electronic or other health record -ordering medications, tests, or procedures -independently interpreting results and communicating results to the patient/ family/caregiver -referring and communicating with other health care professionals  -care coordination

## 2022-05-23 ENCOUNTER — Emergency Department (HOSPITAL_BASED_OUTPATIENT_CLINIC_OR_DEPARTMENT_OTHER)
Admission: EM | Admit: 2022-05-23 | Discharge: 2022-05-23 | Disposition: A | Discharge status: Home / Self Care | Payer: 59 | Attending: Emergency Medicine | Admitting: Emergency Medicine

## 2022-05-23 ENCOUNTER — Ambulatory Visit: Payer: Self-pay

## 2022-05-23 ENCOUNTER — Other Ambulatory Visit: Payer: Self-pay

## 2022-05-23 ENCOUNTER — Emergency Department (HOSPITAL_BASED_OUTPATIENT_CLINIC_OR_DEPARTMENT_OTHER): Payer: 59

## 2022-05-23 ENCOUNTER — Other Ambulatory Visit (HOSPITAL_BASED_OUTPATIENT_CLINIC_OR_DEPARTMENT_OTHER): Payer: Self-pay

## 2022-05-23 ENCOUNTER — Encounter (HOSPITAL_BASED_OUTPATIENT_CLINIC_OR_DEPARTMENT_OTHER): Payer: Self-pay | Admitting: Emergency Medicine

## 2022-05-23 DIAGNOSIS — J45909 Unspecified asthma, uncomplicated: Secondary | ICD-10-CM | POA: Diagnosis not present

## 2022-05-23 DIAGNOSIS — I10 Essential (primary) hypertension: Secondary | ICD-10-CM | POA: Insufficient documentation

## 2022-05-23 DIAGNOSIS — Z79899 Other long term (current) drug therapy: Secondary | ICD-10-CM | POA: Diagnosis not present

## 2022-05-23 DIAGNOSIS — J069 Acute upper respiratory infection, unspecified: Secondary | ICD-10-CM

## 2022-05-23 DIAGNOSIS — Z1152 Encounter for screening for COVID-19: Secondary | ICD-10-CM | POA: Insufficient documentation

## 2022-05-23 DIAGNOSIS — R059 Cough, unspecified: Secondary | ICD-10-CM | POA: Diagnosis not present

## 2022-05-23 DIAGNOSIS — R0602 Shortness of breath: Secondary | ICD-10-CM | POA: Diagnosis not present

## 2022-05-23 LAB — RESP PANEL BY RT-PCR (RSV, FLU A&B, COVID)  RVPGX2
Influenza A by PCR: NEGATIVE
Influenza B by PCR: NEGATIVE
Resp Syncytial Virus by PCR: NEGATIVE
SARS Coronavirus 2 by RT PCR: NEGATIVE

## 2022-05-23 MED ORDER — PREDNISONE 20 MG PO TABS
40.0000 mg | ORAL_TABLET | Freq: Every day | ORAL | 0 refills | Status: AC
  Filled 2022-05-23: qty 6, 3d supply, fill #0

## 2022-05-23 MED ORDER — PROMETHAZINE-DM 6.25-15 MG/5ML PO SYRP: 5.0000 mL | ORAL_SOLUTION | Freq: Four times a day (QID) | ORAL | 0 refills | Status: DC | PRN

## 2022-05-23 MED ORDER — PROMETHAZINE-DM 6.25-15 MG/5ML PO SYRP
5.0000 mL | ORAL_SOLUTION | Freq: Four times a day (QID) | ORAL | 0 refills | Status: AC | PRN
  Filled 2022-05-23: qty 180, 9d supply, fill #0

## 2022-05-23 NOTE — Discharge Instructions (Addendum)
You were seen in the emergency department today for cough and hoarseness.  We have tested you for covid, flu, and RSV -- these results are still pending.  The results can be checked on MyChart, further instructions on how to do so been provided in your discharge paperwork.  However, it is still likely that your symptoms are related to a viral illness.   Treatment is directed at relieving symptoms.  There is no cure for acute viral infections; it is usually best to let them run their course.  Symptoms usually last around 1-2 weeks, though cough may occasionally linger for up to 3-4 weeks.  As discussed, antibiotics are not effective with viral infections, because the infection is caused by a virus, not by bacteria.   Treatments may include:  Increased fluid intake. Sports drinks offer valuable electrolytes, sugars, and fluids.  Breathing heated mist or steam (vaporizer or shower).  Eating chicken soup or other clear broths, and maintaining good nutrition.  Getting plenty of rest.  Using lozenges, tea with honey, or warm salt water for cough relief/sore throat relief (Cepkaol or [place] lozenges are available over-the-counter) Increasing usage of your inhaler if you have asthma.   Take over-the-counter Benadryl, Zyrtec, or Claritin to decrease sinus secretions, with Mucinex to help break up remaining mucus Continue to alternate between Tylenol and ibuprofen for pain and fever control.  A cough syrup by the name of Phenergan DM has been sent to the pharmacy.  You may take 5 mL every 6 hours as needed for cough relief.  Please keep in mind this may cause drowsiness, do not drive or operate machinery after taking this medication.  You may return to work 24 hours after your temperature has returned to normal.  Please follow up with your primary care doctor in 3-5 days for recheck of ongoing symptoms.  Return to emergency department for emergent changing or worsening of symptoms.

## 2022-05-23 NOTE — Telephone Encounter (Signed)
  Chief Complaint: Continued cough with SOB. Tearful."The inhaler, nothing is helping." Symptoms: SOB, wheezing. Frequency: 7 days ago Pertinent Negatives: Patient denies fever Disposition: '[x]'$ ED /'[]'$ Urgent Care (no appt availability in office) / '[]'$ Appointment(In office/virtual)/ '[]'$  [facility] [facility]/ '[]'$ Home Care/ '[]'$ Refused Recommended Disposition /'[]'$ [facility]/ '[]'$  Follow-up with PCP Additional Notes: Will call 911.   Reason for Disposition  Sounds like a life-threatening emergency to the triager  Answer Assessment - Initial Assessment Questions 1. ONSET: "When did the cough begin?"      7 Days 2. SEVERITY: "How bad is the cough today?"      Severe 3. SPUTUM: "Describe the color of your sputum" (none, dry cough; clear, white, yellow, green)     None 4. HEMOPTYSIS: "Are you coughing up any blood?" If so ask: "How much?" (flecks, streaks, tablespoons, etc.)     No 5. DIFFICULTY BREATHING: "Are you having difficulty breathing?" If Yes, ask: "How bad is it?" (e.g., mild, moderate, severe)    - MILD: No SOB at rest, mild SOB with walking, speaks normally in sentences, can lie down, no retractions, pulse < 100.    - MODERATE: SOB at rest, SOB with minimal exertion and prefers to sit, cannot lie down flat, speaks in phrases, mild retractions, audible wheezing, pulse 100-120.    - SEVERE: Very SOB at rest, speaks in single words, struggling to breathe, sitting hunched forward, retractions, pulse > 120      Mild 6. FEVER: "Do you have a fever?" If Yes, ask: "What is your temperature, how was it measured, and when did it start?"     Unsure 7. CARDIAC HISTORY: "Do you have any history of heart disease?" (e.g., heart attack, congestive heart failure)      No 8. LUNG HISTORY: "Do you have any history of lung disease?"  (e.g., pulmonary embolus, asthma, emphysema)     No 9. PE RISK FACTORS: "Do you have a history of blood clots?" (or: recent major surgery, recent prolonged travel,  bedridden)     NO 10. OTHER SYMPTOMS: "Do you have any other symptoms?" (e.g., runny nose, wheezing, chest pain)       Wheezing 11. PREGNANCY: "Is there any chance you are pregnant?" "When was your last menstrual period?"       No 12. TRAVEL: "Have you traveled out of the country in the last month?" (e.g., travel history, exposures)       NO  Protocols used: Cough - Acute Non-Productive-A-AH

## 2022-05-23 NOTE — Telephone Encounter (Signed)
Patient currently being evaluated in the ED.

## 2022-05-23 NOTE — ED Notes (Signed)
   05/23/22 0926  Therapy Vitals  Temp 98.6 F (37 C)  Temp Source Oral  Pulse Rate 73  Resp (!) 22  BP (!) 153/105  MEWS Score/Color  MEWS Score 1  MEWS Score Color Green  Respiratory Assessment  $ RT Protocol Assessment  Yes  Assessment Type Assess only  Respiratory Pattern Regular;Unlabored;Symmetrical;Dyspnea at rest;Shallow  Chest Assessment Chest expansion symmetrical  Cough Non-productive;Strong  R Upper  Breath Sounds Diminished;Clear  L Upper Breath Sounds Diminished;Clear  R Lower Breath Sounds Diminished  L Lower Breath Sounds Diminished  Oxygen Therapy/Pulse Ox  O2 Device Room Air (shallow breathing)  SpO2 96 %

## 2022-05-23 NOTE — ED Provider Notes (Signed)
[facility] Provider Note   CSN: [id number] Arrival date & time: 05/23/22  8676     History  Chief Complaint  Patient presents with   Cough   Shortness of Breath    Kristen Woods is a 57 y.o. female with hx of chronic fatigue syndrome, HTN, OSA presenting with sinus congestion, postnasal drip, wheezing, and intermittent cough.  Patient denies shortness of breath to this provider.  Denies fever/chills or difficulty swallowing.  Patient also expresses she believed she had a childhood history of an asthma attack in her 75s, and states after further evaluation this was ruled out.  Denies neck stiffness.  Denies N/V/D.  Seen by UC 05/21/22 and diagnosed with acute bronchitis, wheezing, and acute URI.  Was given albuterol neb and kenalog IM in office, in addition to albuterol inhaler, prednisone, Zpak and mucinex for home use.  Was recommended to follow up with PCP if no improvement.  The history is provided by the patient and medical records.  Cough Associated symptoms: shortness of breath   Shortness of Breath Associated symptoms: cough       Home Medications Prior to Admission medications   Medication Sig Start Date End Date Taking? Authorizing Provider  predniSONE (DELTASONE) 20 MG tablet Take 2 tablets (40 mg total) by mouth daily for 3 days. 05/23/22 05/26/22 Yes Prince Rome, PA-C  albuterol (VENTOLIN HFA) 108 (90 Base) MCG/ACT inhaler Inhale 2 puffs into the lungs every 6 (six) hours as needed for wheezing or shortness of breath. 05/21/22   Nyoka Lint, PA-C  azithromycin (ZITHROMAX Z-PAK) 250 MG tablet 2 tablets initially and then 1 tablet daily until completed. 05/21/22   Nyoka Lint, PA-C  dextromethorphan-guaiFENesin [facility] DM) 30-600 MG 12hr tablet Take 1 tablet by mouth 2 (two) times daily. 05/21/22   Nyoka Lint, PA-C  diclofenac Sodium (VOLTAREN) 1 % GEL Apply 2 g topically 4 (four) times daily as needed. 03/20/22    Long, Wonda Olds, MD  gabapentin (NEURONTIN) 300 MG capsule Take 1 capsule (300 mg total) by mouth 3 (three) times daily. 03/19/22   Dorna Mai, MD  lisinopril-hydrochlorothiazide (ZESTORETIC) 20-25 MG tablet Take 1 tablet by mouth daily. 03/19/22   Dorna Mai, MD  methocarbamol (ROBAXIN) 500 MG tablet Take 1 tablet (500 mg total) by mouth every 8 (eight) hours as needed for muscle spasms. 03/20/22   Long, Wonda Olds, MD  montelukast (SINGULAIR) 10 MG tablet Take 1 tablet (10 mg total) by mouth at bedtime. 03/19/22   Dorna Mai, MD  omeprazole (PRILOSEC) 40 MG capsule Take 1 capsule (40 mg total) by mouth daily. 03/19/22   Dorna Mai, MD  promethazine-dextromethorphan (PROMETHAZINE-DM) 6.25-15 MG/5ML syrup Take 5 mLs by mouth 4 (four) times daily as needed for cough. 04/29/48   Prince Rome, PA-C  Spacer/Aero-Holding Josiah Lobo DEVI 1 Units by Does not apply route in the morning, at noon, and at bedtime. 05/21/22   Nyoka Lint, PA-C      Allergies    Patient has no known allergies.    Review of Systems   Review of Systems  Respiratory:  Positive for cough and shortness of breath.     Physical Exam Updated Vital Signs BP (!) 153/105   Pulse 73   Temp 98.6 F (37 C) (Oral)   Resp (!) 22   Wt 133.8 kg   LMP 01/04/2015   SpO2 96%   BMI 47.61 kg/m  Physical Exam Vitals and nursing note reviewed.  Constitutional:      General: She is not in acute distress.    Appearance: She is well-developed. She is not ill-appearing, toxic-appearing or diaphoretic.  HENT:     Head: Normocephalic and atraumatic.     Jaw: There is normal jaw occlusion.     Right Ear: Tympanic membrane, ear canal and external ear normal.     Left Ear: Tympanic membrane, ear canal and external ear normal.     Nose: Congestion and rhinorrhea present.     Mouth/Throat:     Comments: Airway patent.  Uvula midline without swelling.  Handling secretions without difficulty.  Tongue without elevation or  swelling or deviation.  Without tonsillar exudate, erythema, abscess, edema.  Pharynx clear, mouth moist.  Mild erythema of the posterior oropharynx. Eyes:     Conjunctiva/sclera: Conjunctivae normal.  Neck:     Comments: Appears supple on exam, no meningismus or torticollis Cardiovascular:     Rate and Rhythm: Normal rate and regular rhythm.     Heart sounds: No murmur heard. Pulmonary:     Effort: Pulmonary effort is normal. No tachypnea, accessory muscle usage, respiratory distress or retractions.     Breath sounds: Normal breath sounds. No decreased air movement or transmitted upper airway sounds. No decreased breath sounds, wheezing, rhonchi or rales.     Comments: CTAB, equal chest rise, very good air movement.  Some periods able to speak without difficulty, other periods with coughing fits voice becomes more hoarse.  Without increased respiratory effort.  No stridor, drooling, tripoding. Abdominal:     Palpations: Abdomen is soft.     Tenderness: There is no abdominal tenderness.  Musculoskeletal:        General: No swelling.     Cervical back: Neck supple. No rigidity or torticollis.  Lymphadenopathy:     Cervical: No cervical adenopathy.  Skin:    General: Skin is warm and dry.     Capillary Refill: Capillary refill takes less than 2 seconds.     Coloration: Skin is not cyanotic, jaundiced, mottled or pale.  Neurological:     Mental Status: She is alert.  Psychiatric:        Mood and Affect: Mood is anxious.     Comments: Anxious appearing and tearful intermittently on exam, usually occurs simultaneously with coughing fits.     ED Results / Procedures / Treatments   Labs (all labs ordered are listed, but only abnormal results are displayed) Labs Reviewed  RESP PANEL BY RT-PCR (RSV, FLU A&B, COVID)  [id number]    EKG None  Radiology DG Chest 2 View  Result Date: 05/23/2022 CLINICAL DATA:  Cough, shortness of breath EXAM: CHEST - 2 VIEW COMPARISON:  09/13/2015 FINDINGS:  The heart size and mediastinal contours are within normal limits. Both lungs are clear. The visualized skeletal structures are unremarkable. IMPRESSION: No active cardiopulmonary disease. Electronically Signed   By: Kathreen Devoid M.D.   On: 05/23/2022 09:45    Procedures Procedures    Medications Ordered in ED Medications - No data to display  ED Course/ Medical Decision Making/ A&P                             Medical Decision Making Amount and/or Complexity of Data Reviewed Radiology: ordered.  Risk Prescription drug management.   Patient is a 57 year old female presenting with 5 days of URI symptoms.  Patient reports history of childhood asthma, states she was further  evaluated, and was found not to have asthma or lung disease, stated this occurred in her early 27s.  Overall well though occasionally anxious appearing on exam.  Anxiousness appears to correlate with coughing episodes, which patient occasionally becomes tearful as well.  Patient's phonation and cough improves after pt relaxes.  During coughing episodes, pt moves air adequately and does not appear in respiratory distress.  Pt afebrile without tonsillar exudate, erythema, or swelling.  Low clinical suspicion for strep pharyngitis.  Satting at 96-99% on room air.  Hemodynamically stable.  Good air movement without wheezing or rales.  No accessory muscle use, tripoding, drooling, nasal flaring, or retractions appreciated.  No Hx of asthma or COPD.  CXR negative for acute pathology such as pneumothorax or pneumonia at this time.  Without lymphadenopathy or dysphagia.  Mild hoarseness, without hot-potato voice.  Airway patent.  No angioedema.  No oral/neck mass palpated or appreciated.  Presentation non concerning for PTA or RPA.  No trismus or uvula deviation.  Pt able to drink water in ED without difficulty with intact air way.   Doubt meningitis.  Low clinical suspicion for otitis media or otitis externa or mastoiditis at this  time.  Ambulates well without difficulty.  Negative for COVID, flu, RSV.  Clinical diagnosis of viral pharyngitis with suspicion of likely laryngitis etiology as well.  Pt does not appear dehydrated, but did discuss importance of water rehydration.  Recommended additional symptom management and close PCP follow up.  Patient advised to stop taking the prednisone dosage prescribed by urgent care for an increased regimen starting today.  Already has albuterol refill.  Steroid course and Phenergan DM sent to pharmacy.  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  I find this reasonable.  Provide note for work.  Strict return precautions discussed at length.  Patient in NAD and in good condition at time of discharge.   After consideration the patient's encounter today, I do not feel today's workup suggests an emergent condition requiring admission or immediate intervention beyond what has been performed at this time.  Safe for discharge; instructed to return immediately for worsening symptoms, change in symptoms or any other concerns.  I have reviewed the patients home medicines and have made adjustments as needed.  Discussed course of treatment with the patient, whom demonstrated understanding.  Patient in agreement and has no further questions.    This chart was dictated using voice recognition software.  Despite best efforts to proofread,  errors can occur which can change the documentation meaning.         Final Clinical Impression(s) / ED Diagnoses Final diagnoses:  Upper respiratory tract infection, unspecified type    Rx / DC Orders ED Discharge Orders          Ordered    promethazine-dextromethorphan (PROMETHAZINE-DM) 6.25-15 MG/5ML syrup  4 times daily PRN,   Status:  Discontinued        05/23/22 1029    promethazine-dextromethorphan (PROMETHAZINE-DM) 6.25-15 MG/5ML syrup  4 times daily PRN        05/23/22 1030    predniSONE (DELTASONE)  20 MG tablet  Daily        05/23/22 1031              Dorise Bullion [place], [place] 22/97/98 Kai Levins, MD 05/29/22 [phone]

## 2022-05-23 NOTE — ED Triage Notes (Signed)
Cough x 1 week , shortness of breath . Unable to speak full sentence . Audible wheezing .

## 2022-05-27 ENCOUNTER — Ambulatory Visit (INDEPENDENT_AMBULATORY_CARE_PROVIDER_SITE_OTHER): Payer: 59 | Admitting: Family Medicine

## 2022-05-27 ENCOUNTER — Encounter: Payer: Self-pay | Admitting: Family Medicine

## 2022-05-27 VITALS — BP 152/98 | HR 80 | Temp 98.1°F | Resp 16 | Wt 295.0 lb

## 2022-05-27 DIAGNOSIS — J029 Acute pharyngitis, unspecified: Secondary | ICD-10-CM

## 2022-05-27 DIAGNOSIS — J040 Acute laryngitis: Secondary | ICD-10-CM | POA: Diagnosis not present

## 2022-05-27 DIAGNOSIS — I10 Essential (primary) hypertension: Secondary | ICD-10-CM | POA: Diagnosis not present

## 2022-05-27 LAB — POCT RAPID STREP A (OFFICE): Rapid Strep A Screen: NEGATIVE

## 2022-05-27 MED ORDER — AMOXICILLIN 875 MG PO TABS: 875.0000 mg | ORAL_TABLET | Freq: Two times a day (BID) | ORAL | 0 refills | Status: AC

## 2022-05-27 MED ORDER — PREDNISONE 20 MG PO TABS: 40.0000 mg | ORAL_TABLET | Freq: Every day | ORAL | 0 refills | Status: DC

## 2022-05-27 NOTE — Progress Notes (Unsigned)
Established Patient Office Visit  Subjective    Patient ID: Kristen Woods, female    DOB: 25-Oct-1965  Age: 57 y.o. MRN: [id number]  CC:  Chief Complaint  Patient presents with   Sore Throat    HPI Kristen Woods presents with complaint of sore throat for about 1 week. She has also lost her voice. She denies fever/chills or viral sx. She denies known contacts or exposures. She reports these sx have been recurrent. Patient was seen at [facility] and is completing course of prednisone.    Outpatient Encounter Medications as of 05/27/2022  Medication Sig   albuterol (VENTOLIN HFA) 108 (90 Base) MCG/ACT inhaler Inhale 2 puffs into the lungs every 6 (six) hours as needed for wheezing or shortness of breath.   amoxicillin (AMOXIL) 875 MG tablet Take 1 tablet (875 mg total) by mouth 2 (two) times daily for 10 days.   azithromycin (ZITHROMAX Z-PAK) 250 MG tablet 2 tablets initially and then 1 tablet daily until completed.   dextromethorphan-guaiFENesin (MUCINEX DM) 30-600 MG 12hr tablet Take 1 tablet by mouth 2 (two) times daily.   diclofenac Sodium (VOLTAREN) 1 % GEL Apply 2 g topically 4 (four) times daily as needed.   gabapentin (NEURONTIN) 300 MG capsule Take 1 capsule (300 mg total) by mouth 3 (three) times daily.   ibuprofen (ADVIL) 800 MG tablet Take 800 mg by mouth 3 (three) times daily.   lisinopril-hydrochlorothiazide (ZESTORETIC) 20-25 MG tablet Take 1 tablet by mouth daily.   methocarbamol (ROBAXIN) 500 MG tablet Take 1 tablet (500 mg total) by mouth every 8 (eight) hours as needed for muscle spasms.   montelukast (SINGULAIR) 10 MG tablet Take 1 tablet (10 mg total) by mouth at bedtime.   omeprazole (PRILOSEC) 40 MG capsule Take 1 capsule (40 mg total) by mouth daily.   promethazine-dextromethorphan (PROMETHAZINE-DM) 6.25-15 MG/5ML syrup Take 5 mLs by mouth 4 (four) times daily as needed for cough.   Spacer/Aero-Holding Chambers DEVI 1 Units by Does not apply route in the morning, at  noon, and at bedtime.   [DISCONTINUED] predniSONE (DELTASONE) 20 MG tablet Take 2 tablets (40 mg total) by mouth daily with breakfast.   No facility-administered encounter medications on file as of 05/27/2022.    Past Medical History:  Diagnosis Date   Back pain    Chronic fatigue syndrome    Depression    High cholesterol    Hypertension    Joint pain    Joint pain    Leg pain    Liver disease    Nerves    Sleep apnea    Swelling of lower extremity    Vision abnormalities     Past Surgical History:  Procedure Laterality Date   BREAST SURGERY     INCONTINENCE SURGERY     KNEE SURGERY      Family History  Problem Relation Age of Onset   Hypertension Mother    Heart disease Mother    Diabetes Father    Hyperlipidemia Other    Heart attack Neg Hx    Sudden death Neg Hx     Social History   Socioeconomic History   Marital status: Single    Spouse name: Not on file   Number of children: Not on file   Years of education: Not on file   Highest education level: Not on file  Occupational History   Not on file  Tobacco Use   Smoking status: Never   Smokeless tobacco: Not on file  Substance and Sexual Activity   Alcohol use: No   Drug use: No   Sexual activity: Not on file  Other Topics Concern   Not on file  Social History Narrative   Not on file   Social Determinants of Health   Financial Resource Strain: Not on file  Food Insecurity: Not on file  Transportation Needs: Not on file  Physical Activity: Not on file  Stress: Not on file  Social Connections: Not on file  Intimate Partner Violence: Not on file    Review of Systems  All other systems reviewed and are negative.       Objective    BP (!) 152/98   Pulse 80   Temp 98.1 F (36.7 C) (Oral)   Resp 16   Wt 295 lb (133.8 kg)   LMP 01/04/2015   SpO2 98%   BMI 47.61 kg/m   Physical Exam Vitals and nursing note reviewed.  Constitutional:      General: She is not in acute  distress. HENT:     Mouth/Throat:     Mouth: Mucous membranes are moist.     Pharynx: Oropharynx is clear. Posterior oropharyngeal erythema present. No oropharyngeal exudate.  Cardiovascular:     Rate and Rhythm: Normal rate and regular rhythm.  Pulmonary:     Effort: Pulmonary effort is normal.     Breath sounds: Normal breath sounds.  Abdominal:     Palpations: Abdomen is soft.     Tenderness: There is no abdominal tenderness.  Neurological:     General: No focal deficit present.     Mental Status: She is alert and oriented to person, place, and time.         Assessment & Plan:   1. Acute pharyngitis, unspecified etiology Cultures pending. Amoxicillin prescribed. Referral to ENT for further eval/mgt - Ambulatory referral to ENT - Culture, Group A Strep - POCT rapid strep A  2. Laryngitis As above  3. Essential hypertension Slightly elevated reading.     Return if symptoms worsen or fail to improve.   Becky Sax, MD

## 2022-05-29 ENCOUNTER — Encounter: Payer: Self-pay | Admitting: Family Medicine

## 2022-05-30 ENCOUNTER — Encounter: Payer: Self-pay | Admitting: Family Medicine

## 2022-05-30 LAB — CULTURE, GROUP A STREP: Strep A Culture: NEGATIVE

## 2022-06-02 NOTE — Telephone Encounter (Signed)
Spoke with patient and she will see provider on 06/04/2022 for work note.

## 2022-06-04 ENCOUNTER — Ambulatory Visit (INDEPENDENT_AMBULATORY_CARE_PROVIDER_SITE_OTHER): Payer: 59 | Admitting: Family Medicine

## 2022-06-04 VITALS — BP 141/84 | HR 76 | Temp 98.3°F | Resp 16 | Wt 295.0 lb

## 2022-06-04 DIAGNOSIS — J040 Acute laryngitis: Secondary | ICD-10-CM | POA: Diagnosis not present

## 2022-06-04 DIAGNOSIS — J029 Acute pharyngitis, unspecified: Secondary | ICD-10-CM

## 2022-06-04 NOTE — Progress Notes (Unsigned)
New Patient Office Visit  Subjective    Patient ID: Kristen Woods, female    DOB: 07/15/1965  Age: 57 y.o. MRN: [id number]  CC: No chief complaint on file.   HPI Kristen Woods presents to establish care ***  Outpatient Encounter Medications as of 06/04/2022  Medication Sig   albuterol (VENTOLIN HFA) 108 (90 Base) MCG/ACT inhaler Inhale 2 puffs into the lungs every 6 (six) hours as needed for wheezing or shortness of breath.   amoxicillin (AMOXIL) 875 MG tablet Take 1 tablet (875 mg total) by mouth 2 (two) times daily for 10 days.   azithromycin (ZITHROMAX Z-PAK) 250 MG tablet 2 tablets initially and then 1 tablet daily until completed.   dextromethorphan-guaiFENesin (MUCINEX DM) 30-600 MG 12hr tablet Take 1 tablet by mouth 2 (two) times daily.   diclofenac Sodium (VOLTAREN) 1 % GEL Apply 2 g topically 4 (four) times daily as needed.   gabapentin (NEURONTIN) 300 MG capsule Take 1 capsule (300 mg total) by mouth 3 (three) times daily.   ibuprofen (ADVIL) 800 MG tablet Take 800 mg by mouth 3 (three) times daily.   lisinopril-hydrochlorothiazide (ZESTORETIC) 20-25 MG tablet Take 1 tablet by mouth daily.   methocarbamol (ROBAXIN) 500 MG tablet Take 1 tablet (500 mg total) by mouth every 8 (eight) hours as needed for muscle spasms.   montelukast (SINGULAIR) 10 MG tablet Take 1 tablet (10 mg total) by mouth at bedtime.   omeprazole (PRILOSEC) 40 MG capsule Take 1 capsule (40 mg total) by mouth daily.   promethazine-dextromethorphan (PROMETHAZINE-DM) 6.25-15 MG/5ML syrup Take 5 mLs by mouth 4 (four) times daily as needed for cough.   Spacer/Aero-Holding Chambers DEVI 1 Units by Does not apply route in the morning, at noon, and at bedtime.   No facility-administered encounter medications on file as of 06/04/2022.    Past Medical History:  Diagnosis Date   Back pain    Chronic fatigue syndrome    Depression    High cholesterol    Hypertension    Joint pain    Joint pain    Leg  pain    Liver disease    Nerves    Sleep apnea    Swelling of lower extremity    Vision abnormalities     Past Surgical History:  Procedure Laterality Date   BREAST SURGERY     INCONTINENCE SURGERY     KNEE SURGERY      Family History  Problem Relation Age of Onset   Hypertension Mother    Heart disease Mother    Diabetes Father    Hyperlipidemia Other    Heart attack Neg Hx    Sudden death Neg Hx     Social History   Socioeconomic History   Marital status: Single    Spouse name: Not on file   Number of children: Not on file   Years of education: Not on file   Highest education level: Not on file  Occupational History   Not on file  Tobacco Use   Smoking status: Never   Smokeless tobacco: Not on file  Substance and Sexual Activity   Alcohol use: No   Drug use: No   Sexual activity: Not on file  Other Topics Concern   Not on file  Social History Narrative   Not on file   Social Determinants of Health   Financial Resource Strain: Not on file  Food Insecurity: Not on file  Transportation Needs: Not on file  Physical Activity:  Not on file  Stress: Not on file  Social Connections: Not on file  Intimate Partner Violence: Not on file    ROS      Objective    BP (!) 141/84   Pulse 76   Temp 98.3 F (36.8 C) (Oral)   Resp 16   Wt 295 lb (133.8 kg)   LMP 01/04/2015   SpO2 95%   BMI 47.61 kg/m   Physical Exam  {Labs (Optional):23779}    Assessment & Plan:   Problem List Items Addressed This Visit   None Visit Diagnoses     Laryngitis    -  Primary   Relevant Orders   Ambulatory referral to ENT   Pharyngitis, unspecified etiology       Relevant Orders   Ambulatory referral to ENT       No follow-ups on file.   Becky Sax, MD

## 2022-06-05 ENCOUNTER — Encounter: Payer: Self-pay | Admitting: Family Medicine

## 2022-06-06 DIAGNOSIS — R490 Dysphonia: Secondary | ICD-10-CM | POA: Diagnosis not present

## 2022-06-12 ENCOUNTER — Encounter: Payer: Self-pay | Admitting: *Deleted

## 2022-06-12 ENCOUNTER — Ambulatory Visit (INDEPENDENT_AMBULATORY_CARE_PROVIDER_SITE_OTHER): Payer: 59 | Admitting: Nurse Practitioner

## 2022-06-12 ENCOUNTER — Encounter: Payer: Self-pay | Admitting: Nurse Practitioner

## 2022-06-12 ENCOUNTER — Encounter: Payer: Self-pay | Admitting: Family Medicine

## 2022-06-12 VITALS — BP 154/94 | HR 64 | Temp 98.3°F | Ht 66.0 in | Wt 294.0 lb

## 2022-06-12 DIAGNOSIS — Z6841 Body Mass Index (BMI) 40.0 and over, adult: Secondary | ICD-10-CM

## 2022-06-12 DIAGNOSIS — R7303 Prediabetes: Secondary | ICD-10-CM

## 2022-06-12 DIAGNOSIS — E538 Deficiency of other specified B group vitamins: Secondary | ICD-10-CM
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
## 2022-06-12 NOTE — Progress Notes (Signed)
Office: [phone]  /  Fax: [phone]  WEIGHT SUMMARY AND BIOMETRICS  Weight Lost Since Last Visit: 1lb   Medical Weight Loss Height: 5' 6"$  (1.676 m) Weight: 294 lb (133.4 kg) Temp: 98.3 F (36.8 C) Pulse Rate: 64 BP: (!) 154/94 SpO2: 98 % Fasting: Yes Today's Visit #: 7 Weight at Last VIsit: 295lb Weight Lost Since Last Visit: 1lb  Body Fat %: 55 % Fat Mass (lbs): 162 lbs Muscle Mass (lbs): 126 lbs Total Body Water (lbs): 96.6 lbs Visceral Fat Rating : 20 Starting Date: 02/04/22 Starting Weight: 300lb Total Weight Loss (lbs): 6 lb (2.722 kg)     HPI  Chief Complaint: OBESITY  Kristen Woods is here to discuss her progress with her obesity treatment plan. She is on the the Category 3 Plan and states she is following her eating plan approximately 40 % of the time. She states she is exercising 30 minutes 3 days per week-chair exercise.   Interval History:  Since last office visit she has lost 1 pound.  She has gotten off track due to recent illness.  She hasn't been following the meal plan as she has in the past. She is eating Special K for breakfast. Not able to cook at home due to living situation.  She is drinking water and avoiding sugary drinks.  She has been out of work since Feb 9th and plans to be out until Feb 28th-until she sees rehabilitation on 06/18/22.  She saw Dr. West Carbo on 06/06/22 (see note in chart).  Struggles with cravings.   Pharmacotherapy for weight loss: She is not currently taking medications  for medical weight loss.    Previous pharmacotherapy for medical weight loss:  [facility]    Prediabetes Last A1c was 6.0  Medication(s): Never been on meds.  FH:  father and mother with DMT2  Lab Results  Component Value Date   HGBA1C 6.0 (H) 02/04/2022   Lab Results  Component Value Date   INSULIN 13.4 02/04/2022    Vit B12 def She is not currently Vit B12. She is taking a MV.  She has not taken Vit B12 injections in the past.   Reports  fatigue all the time  PHYSICAL EXAM:  Blood pressure (!) 154/94, pulse 64, temperature 98.3 F (36.8 C), height 5' 6"$  (1.676 m), weight 294 lb (133.4 kg), last menstrual period 01/04/2015, SpO2 98 %. Body mass index is 47.45 kg/m.  General: She is overweight, cooperative, alert, well developed, and in no acute distress. PSYCH: Has normal mood, affect and thought process.   Extremities: No edema.  Neurologic: No gross sensory or motor deficits. No tremors or fasciculations noted.    DIAGNOSTIC DATA REVIEWED:  BMET    Component Value Date/Time   NA 141 02/04/2022 1046   K 4.2 02/04/2022 1046   CL 103 02/04/2022 1046   CO2 23 02/04/2022 1046   GLUCOSE 88 02/04/2022 1046   GLUCOSE 86 09/25/2021 1315   BUN 10 02/04/2022 1046   CREATININE 1.02 (H) 02/04/2022 1046   CREATININE 1.04 (H) 09/25/2021 1315   CALCIUM 9.6 02/04/2022 1046   GFRNONAA 43 (L) 09/13/2015 1734   GFRAA 50 (L) 09/13/2015 1734   Lab Results  Component Value Date   HGBA1C 6.0 (H) 02/04/2022   Lab Results  Component Value Date   INSULIN 13.4 02/04/2022   Lab Results  Component Value Date   TSH 2.190 02/04/2022   CBC    Component Value Date/Time   WBC WILL FOLLOW 09/25/2021  1315   WBC 8.7 09/13/2015 1734   RBC WILL FOLLOW 09/25/2021 1315   RBC 3.89 09/13/2015 1734   HGB WILL FOLLOW 09/25/2021 1315   HCT WILL FOLLOW 09/25/2021 1315   PLT WILL FOLLOW 09/25/2021 1315   MCV WILL FOLLOW 09/25/2021 1315   MCH WILL FOLLOW 09/25/2021 1315   MCH 27.8 09/13/2015 1734   MCHC WILL FOLLOW 09/25/2021 1315   MCHC 32.4 09/13/2015 1734   RDW WILL FOLLOW 09/25/2021 1315   Iron Studies No results found for: "IRON", "TIBC", "FERRITIN", "IRONPCTSAT" Lipid Panel     Component Value Date/Time   CHOL 209 (H) 02/04/2022 1046   TRIG 111 02/04/2022 1046   HDL 67 02/04/2022 1046   LDLCALC 122 (H) 02/04/2022 1046   Hepatic Function Panel     Component Value Date/Time   PROT 7.0 02/04/2022 1046   ALBUMIN 4.3  02/04/2022 1046   AST 18 02/04/2022 1046   ALT 18 02/04/2022 1046   ALKPHOS 130 (H) 02/04/2022 1046   BILITOT 0.2 02/04/2022 1046      Component Value Date/Time   TSH 2.190 02/04/2022 1046   Nutritional Lab Results  Component Value Date   VD25OH 32.8 02/04/2022     ASSESSMENT AND PLAN  TREATMENT PLAN FOR OBESITY:  Recommended Dietary Goals  Demeatrice is currently in the action stage of change. As such, her goal is to continue weight management plan. She has agreed to the Category 3 Plan.  Behavioral Intervention  We discussed the following Behavioral Modification Strategies today: increasing lean protein intake, decreasing simple carbohydrates , increasing vegetables, avoid skipping meals, increase water intake, work on meal planning and easy cooking plans, and think about ways to increase physical activity.  Additional resources provided today: NA  Recommended Physical Activity Goals  Jodene has been advised to work up to 150 minutes of moderate intensity aerobic activity a week and strengthening exercises 2-3 times per week for cardiovascular health, weight loss maintenance and preservation of muscle mass.   She has agreed to continue physical activity as is.    ASSOCIATED CONDITIONS ADDRESSED TODAY  Action/Plan  Pre-diabetes Consider Metformin.  Never taken in the past. Labs obtained today.  Information given on pre diabetes and Metformin today.  -     Hemoglobin A1c -     Comprehensive metabolic panel  Ciana will continue to work on weight loss, exercise, and decreasing simple carbohydrates to help decrease the risk of diabetes.    Vitamin B12 deficiency Labs obtained today -     Comprehensive metabolic panel -     Vitamin B12  Morbid obesity ([place]) -     Comprehensive metabolic panel  BMI [id number], adult (HCC) -     Comprehensive metabolic panel      To make appt with Dr. Mallie Mussel. Consider Wellbutrin due to complaints of cravings.     Return  in about 4 weeks (around 07/10/2022), or Please make appt with Dr. Mallie Mussel.Marland Kitchen She was informed of the importance of frequent follow up visits to maximize her success with intensive lifestyle modifications for her multiple health conditions.   ATTESTASTION STATEMENTS:  Reviewed by clinician on day of visit: allergies, medications, problem list, medical history, surgical history, family history, social history, and previous encounter notes.   Time spent on visit including pre-visit chart review and post-visit care and charting was 30 minutes.    Ailene Rud. Tarvares Lant FNP-C

## 2022-06-13 LAB — COMPREHENSIVE METABOLIC PANEL
ALT: 13 IU/L (ref 0–32)
AST: 12 IU/L (ref 0–40)
Albumin/Globulin Ratio: 1.4 (ref 1.2–2.2)
Albumin: 4.1 g/dL (ref 3.8–4.9)
Alkaline Phosphatase: 115 IU/L (ref 44–121)
BUN/Creatinine Ratio: 16 (ref 9–23)
BUN: 17 mg/dL (ref 6–24)
Bilirubin Total: 0.4 mg/dL (ref 0.0–1.2)
CO2: 21 mmol/L (ref 20–29)
Calcium: 9.4 mg/dL (ref 8.7–10.2)
Chloride: 106 mmol/L (ref 96–106)
Creatinine, Ser: 1.04 mg/dL — ABNORMAL HIGH (ref 0.57–1.00)
Globulin, Total: 2.9 g/dL (ref 1.5–4.5)
Glucose: 88 mg/dL (ref 70–99)
Potassium: 4.5 mmol/L (ref 3.5–5.2)
Sodium: 142 mmol/L (ref 134–144)
Total Protein: 7 g/dL (ref 6.0–8.5)
eGFR: 63 mL/min/{1.73_m2} (ref >59)

## 2022-06-13 LAB — VITAMIN B12: Vitamin B-12: 784 pg/mL (ref 232–1245)

## 2022-06-13 LAB — HEMOGLOBIN A1C
Est. average glucose Bld gHb Est-mCnc: 126 mg/dL
Hgb A1c MFr Bld: 6 % — ABNORMAL HIGH (ref 4.8–5.6)

## 2022-06-16 ENCOUNTER — Encounter: Payer: Self-pay | Admitting: Family Medicine

## 2022-06-17 ENCOUNTER — Ambulatory Visit (INDEPENDENT_AMBULATORY_CARE_PROVIDER_SITE_OTHER): Payer: 59 | Admitting: Family Medicine

## 2022-06-17 ENCOUNTER — Telehealth (INDEPENDENT_AMBULATORY_CARE_PROVIDER_SITE_OTHER): Payer: 59 | Admitting: Psychology

## 2022-06-17 VITALS — BP 128/91 | HR 91

## 2022-06-17 DIAGNOSIS — F419 Anxiety disorder, unspecified: Secondary | ICD-10-CM

## 2022-06-17 DIAGNOSIS — F32A Depression, unspecified: Secondary | ICD-10-CM

## 2022-06-17 DIAGNOSIS — J040 Acute laryngitis: Secondary | ICD-10-CM | POA: Diagnosis not present

## 2022-06-17 DIAGNOSIS — F5089 Other specified eating disorder: Secondary | ICD-10-CM

## 2022-06-17 NOTE — Progress Notes (Unsigned)
New Patient Office Visit  Subjective    Patient ID: Kristen Woods, female    DOB: 1965/04/29  Age: 57 y.o. MRN: [id number]  CC: No chief complaint on file.   HPI Kristen Woods presents to establish care ***  Outpatient Encounter Medications as of 06/17/2022  Medication Sig   albuterol (VENTOLIN HFA) 108 (90 Base) MCG/ACT inhaler Inhale 2 puffs into the lungs every 6 (six) hours as needed for wheezing or shortness of breath.   azithromycin (ZITHROMAX Z-PAK) 250 MG tablet 2 tablets initially and then 1 tablet daily until completed.   dextromethorphan-guaiFENesin (MUCINEX DM) 30-600 MG 12hr tablet Take 1 tablet by mouth 2 (two) times daily.   diclofenac Sodium (VOLTAREN) 1 % GEL Apply 2 g topically 4 (four) times daily as needed.   gabapentin (NEURONTIN) 300 MG capsule Take 1 capsule (300 mg total) by mouth 3 (three) times daily.   ibuprofen (ADVIL) 800 MG tablet Take 800 mg by mouth 3 (three) times daily.   lisinopril-hydrochlorothiazide (ZESTORETIC) 20-25 MG tablet Take 1 tablet by mouth daily.   methocarbamol (ROBAXIN) 500 MG tablet Take 1 tablet (500 mg total) by mouth every 8 (eight) hours as needed for muscle spasms.   montelukast (SINGULAIR) 10 MG tablet Take 1 tablet (10 mg total) by mouth at bedtime.   omeprazole (PRILOSEC) 40 MG capsule Take 1 capsule (40 mg total) by mouth daily.   promethazine-dextromethorphan (PROMETHAZINE-DM) 6.25-15 MG/5ML syrup Take 5 mLs by mouth 4 (four) times daily as needed for cough.   Spacer/Aero-Holding Chambers DEVI 1 Units by Does not apply route in the morning, at noon, and at bedtime.   No facility-administered encounter medications on file as of 06/17/2022.    Past Medical History:  Diagnosis Date   Back pain    Chronic fatigue syndrome    Depression    High cholesterol    Hypertension    Joint pain    Joint pain    Leg pain    Liver disease    Nerves    Sleep apnea    Swelling of lower extremity    Vision abnormalities      Past Surgical History:  Procedure Laterality Date   BREAST SURGERY     INCONTINENCE SURGERY     KNEE SURGERY      Family History  Problem Relation Age of Onset   Hypertension Mother    Heart disease Mother    Diabetes Father    Hyperlipidemia Other    Heart attack Neg Hx    Sudden death Neg Hx     Social History   Socioeconomic History   Marital status: Single    Spouse name: Not on file   Number of children: Not on file   Years of education: Not on file   Highest education level: Not on file  Occupational History   Not on file  Tobacco Use   Smoking status: Never   Smokeless tobacco: Not on file  Substance and Sexual Activity   Alcohol use: No   Drug use: No   Sexual activity: Not on file  Other Topics Concern   Not on file  Social History Narrative   Not on file   Social Determinants of Health   Financial Resource Strain: Not on file  Food Insecurity: Not on file  Transportation Needs: Not on file  Physical Activity: Not on file  Stress: Not on file  Social Connections: Not on file  Intimate Partner Violence: Not on file  ROS      Objective    BP (!) 128/91   Pulse 91   LMP 01/04/2015   Physical Exam  {Labs (Optional):23779}    Assessment & Plan:   Problem List Items Addressed This Visit   None   No follow-ups on file.   Becky Sax, MD

## 2022-06-17 NOTE — Telephone Encounter (Signed)
Please advise patient.  

## 2022-06-18 ENCOUNTER — Ambulatory Visit: Payer: 59 | Attending: Physician Assistant | Admitting: Speech Pathology

## 2022-06-18 DIAGNOSIS — R490 Dysphonia: Secondary | ICD-10-CM | POA: Insufficient documentation

## 2022-06-19 ENCOUNTER — Encounter: Payer: Self-pay | Admitting: Family Medicine

## 2022-06-19 ENCOUNTER — Other Ambulatory Visit: Payer: Self-pay

## 2022-06-19 ENCOUNTER — Emergency Department (HOSPITAL_BASED_OUTPATIENT_CLINIC_OR_DEPARTMENT_OTHER)
Admission: EM | Admit: 2022-06-19 | Discharge: 2022-06-19 | Disposition: A | Discharge status: Home / Self Care | Payer: 59 | Attending: Emergency Medicine | Admitting: Emergency Medicine

## 2022-06-19 ENCOUNTER — Emergency Department (HOSPITAL_BASED_OUTPATIENT_CLINIC_OR_DEPARTMENT_OTHER): Payer: 59

## 2022-06-19 ENCOUNTER — Encounter: Payer: Self-pay | Admitting: Speech Pathology

## 2022-06-19 ENCOUNTER — Encounter (HOSPITAL_BASED_OUTPATIENT_CLINIC_OR_DEPARTMENT_OTHER): Payer: Self-pay | Admitting: Emergency Medicine

## 2022-06-19 DIAGNOSIS — R221 Localized swelling, mass and lump, neck: Secondary | ICD-10-CM | POA: Diagnosis not present

## 2022-06-19 DIAGNOSIS — Z79899 Other long term (current) drug therapy: Secondary | ICD-10-CM | POA: Diagnosis not present

## 2022-06-19 DIAGNOSIS — R490 Dysphonia: Secondary | ICD-10-CM | POA: Insufficient documentation

## 2022-06-19 LAB — CBC WITH DIFFERENTIAL/PLATELET
Abs Immature Granulocytes: 0.02 10*3/uL (ref 0.00–0.07)
Basophils Absolute: 0 10*3/uL (ref 0.0–0.1)
Basophils Relative: 0 %
Eosinophils Absolute: 0.2 10*3/uL (ref 0.0–0.5)
Eosinophils Relative: 4 %
HCT: 39.9 % (ref 36.0–46.0)
Hemoglobin: 12.8 g/dL (ref 12.0–15.0)
Immature Granulocytes: 0 %
Lymphocytes Relative: 18 %
Lymphs Abs: 1 10*3/uL (ref 0.7–4.0)
MCH: 28.5 pg (ref 26.0–34.0)
MCHC: 32.1 g/dL (ref 30.0–36.0)
MCV: 88.9 fL (ref 80.0–100.0)
Monocytes Absolute: 0.7 10*3/uL (ref 0.1–1.0)
Monocytes Relative: 13 %
Neutro Abs: 3.5 10*3/uL (ref 1.7–7.7)
Neutrophils Relative %: 65 %
Platelets: 395 10*3/uL (ref 150–400)
RBC: 4.49 MIL/uL (ref 3.87–5.11)
RDW: 13.9 % (ref 11.5–15.5)
WBC: 5.3 10*3/uL (ref 4.0–10.5)
nRBC: 0 % (ref 0.0–0.2)

## 2022-06-19 LAB — BASIC METABOLIC PANEL
Anion gap: 3 — ABNORMAL LOW (ref 5–15)
BUN: 17 mg/dL (ref 6–20)
CO2: 28 mmol/L (ref 22–32)
Calcium: 8.9 mg/dL (ref 8.9–10.3)
Chloride: 104 mmol/L (ref 98–111)
Creatinine, Ser: 1.08 mg/dL — ABNORMAL HIGH (ref 0.44–1.00)
GFR, Estimated: 60 mL/min — ABNORMAL LOW (ref >60)
Glucose, Bld: 93 mg/dL (ref 70–99)
Potassium: 4.4 mmol/L (ref 3.5–5.1)
Sodium: 135 mmol/L (ref 135–145)

## 2022-06-19 MED ORDER — IOHEXOL 300 MG/ML  SOLN
80.0000 mL | Freq: Once | INTRAMUSCULAR | Status: AC | PRN
  Administered 2022-06-19: 80 mL via INTRAVENOUS

## 2022-06-19 NOTE — Therapy (Signed)
OUTPATIENT SPEECH LANGUAGE PATHOLOGY [facility] LOUD EVALUATION   Patient Name: Kristen Woods MRN: [id number] DOB:1966/03/05, 57 y.o., female Today's Date: 06/19/2022  PCP: Dorna Mai, MD REFERRING PROVIDER: [place], Camelia Eng, PA-C  END OF SESSION:  End of Session - 06/19/22 1206     Visit Number 1    Number of Visits 7    Date for SLP Re-Evaluation 08/18/22    SLP Start Time 1210    SLP Stop Time  32    SLP Time Calculation (min) 50 min    Activity Tolerance Patient tolerated treatment well             Past Medical History:  Diagnosis Date   Back pain    Chronic fatigue syndrome    Depression    High cholesterol    Hypertension    Joint pain    Joint pain    Leg pain    Liver disease    Nerves    Sleep apnea    Swelling of lower extremity    Vision abnormalities    Past Surgical History:  Procedure Laterality Date   BREAST SURGERY     INCONTINENCE SURGERY     KNEE SURGERY     Patient Active Problem List   Diagnosis Date Noted   Eating disorder 05/22/2022   Pre-diabetes 02/06/2022   Other fatigue 02/04/2022   SOB (shortness of breath) on exertion 02/04/2022   Essential hypertension 02/04/2022   Health care maintenance 02/04/2022   Other hyperlipidemia 02/04/2022   Elevated glucose 02/04/2022   Vitamin D deficiency 02/04/2022   Morbid obesity ([place]) 02/04/2022   Unilateral primary osteoarthritis, right knee 12/16/2021   History of total left knee replacement 12/16/2021   Obstructive sleep apnea syndrome 03/05/2021   Stress incontinence of urine 03/05/2021   Mixed hyperlipidemia 09/01/2019   Chronic anemia 06/13/2019   DDD (degenerative disc disease), cervical 06/13/2019   DDD (degenerative disc disease), lumbosacral 06/13/2019   Peripheral edema 06/13/2019   Gastroesophageal reflux disease 06/13/2019   History of colon polyps 06/13/2019   Mild intermittent asthma 06/13/2019   Lower back pain 01/12/2015   Neck pain 10/05/2014   Left arm pain  10/05/2014   Dysesthesia 10/05/2014   Shoulder pain, left 10/05/2014   Left knee pain 08/02/2012   Right wrist pain 02/09/2012   Left leg pain 11/17/2011    Onset date: Reportedly ~4 weeks ago  REFERRING DIAG: R49.0 (ICD-10-CM) - Dysphonia   THERAPY DIAG:  Dysphonia  Rationale for Evaluation and Treatment: Rehabilitation  SUBJECTIVE:   SUBJECTIVE STATEMENT: "It makes me so mad." Pt exhibited bout of crying after experiencing an "attack" where she began coughing, felt like her throat "closed" and became short of breath.    Pt accompanied by: self  PERTINENT HISTORY: Upper respiratory infection   PAIN:  Are you having pain? Yes: NPRS scale: 5/10 Pain location: L neck Pain description: aching Aggravating factors: turning head L Relieving factors:    FALLS: Has patient fallen in last 6 months? No, Number of falls: 0  LIVING ENVIRONMENT: Lives with: lives with their family; brother and sister-in-law Lives in: House/apartment  PLOF: Independent  PATIENT GOALS: voice  OBJECTIVE:   DIAGNOSTIC FINDINGS:  Flexible Laryngoscopy completed on 06/06/22.  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 Pt became short of breath with speech.   Speech is characterized by Other:  vocal strain - completely aphonic. For trained listener in quiet environment with context, intelligibility was vocal 100% at a "whisper" level.    Only vocalization noted during today's session was when  pt laughed to something in response which seemed to be involuntary. When asked to laugh again, pt was unable to vocalize during laughter.   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 Pt scored 18/60 indicating symptoms consistent with VCD.   TODAY'S TREATMENT:                                                                                                                                         DATE:    PATIENT EDUCATION: Education details: Muscle Tension Dysphonia, VCD Person educated: Patient Education method: Explanation Education comprehension: needs further education    GOALS: Goals reviewed with patient? Yes  SHORT TERM GOALS: Target date: 07/18/22  Pt will demonstrate use of SOVTE with minA verbal cues.  Baseline: Goal status: INITIAL  2.  Pt will demonstrate use of relaxation techniques (diaphragmatic breathing, visualization etc) with minA verbal cues.  Baseline:  Goal status: INITIAL  3.  Pt will recall 3 vocal hygiene strategies independently.  Baseline:  Goal status: INITIAL    LONG TERM GOALS: Target date: 08/18/22  Pt will decrease score on VCD-Q. Baseline:  Goal status: INITIAL  2.  Pt will report successful use of relaxation exercises during an "episode".  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  ENT laryngoscopy results were unremarkable.  Upon entering therapy room, patient was observed to be completely aphonic.  No vocalization was heard during speech for the entirety of today's evaluation.  SLP observed one vocalization during today's assessment when patient was laughing.  Patient was unable to hum or produce any voice during sustained phonation.  Patient was observed to strain when speaking and attempt to produce voice.   SLP explained this could impact voice production.  SLP completed thorough voice case history in which patient reported aching pain in left neck/shoulder and reported swelling on left side and into chest.  SLP noted minimal swelling left side of neck; however, noted a lump on chest this date.  Patient reported it was recommended she receive a CT scan, but has not been completed yet .  During today's session, patient appeared to have a sudden onset of an episode. The following was observed re:  Loud cough Inability to  inhale lasting ~10 seconds Requiring prompting from SLP and attempt to relax and breath through nose Shortness of breath following incident Immediate tears  SLP inquired if this was common for patient.  Patient reports this has been occurring for the past 3-1/2 weeks.  Patient reports after the episode she immediately becomes short of breath and dizzy, as if she is about to pass out.  Patient reports she has not lost consciousness; however, has been close.  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 Patient reported this describes her symptoms and does not know if there is a trigger.  Patient reports she does not always cough prior to the episodes.  Patient is currently living in an environment that may contain several irritants and potential infestation. She also reports significant hx of acid reflux. While muscle tension dysphonia is likely; based off observation, SLP also suspects pt is suffering from VCD. SLP to reach out to ENT for next steps to confirm or r/o diagnosis.   SLP rec skilled ST services to address dysphonia with unknown etiology.  SLP to treat muscle tension dysphonia as well as suspected VCD with vocal hygiene, SOVTE, and relaxation techniques.  OBJECTIVE IMPAIRMENTS: include voice disorder. These impairments are limiting patient from return to work and effectively communicating at home and in community. Factors  affecting potential to achieve goals and functional outcome are  unknown etiology .Marland Kitchen Patient will benefit from skilled SLP services to address above impairments and improve overall function.  REHAB POTENTIAL: Good  PLAN:  SLP FREQUENCY: 1x/week due to monetary concerns  SLP DURATION: 6 weeks  PLANNED INTERVENTIONS: Environmental controls, Cueing hierachy, Internal/external aids, SLP instruction and feedback, Compensatory strategies, and Patient/family education    [place], [place] 06/19/2022, 12:13 PM

## 2022-06-19 NOTE — ED Provider Notes (Signed)
[place] [facility] Provider Note   CSN: [id number] Arrival date & time: 06/19/22  [id number]     History  Chief Complaint  Patient presents with   Neck Swelling    Kristen Woods is a 57 y.o. female.  57 yo F with a chief complaint of hoarseness.  This has been an ongoing issue for her.  She seen her family doctor for this multiple times and has been seen by ENT.  She was scoped by ENT and they thought this was more of a muscular issue and recommended speech therapy.  Since then she feels like she has had more swelling on the left side of her neck.  She had called her family doctor who thought she needed emergent CT imaging encouraged her to come here for evaluation.  She denies any fevers denies cough or congestion.        Home Medications Prior to Admission medications   Medication Sig Start Date End Date Taking? Authorizing Provider  albuterol (VENTOLIN HFA) 108 (90 Base) MCG/ACT inhaler Inhale 2 puffs into the lungs every 6 (six) hours as needed for wheezing or shortness of breath. 05/21/22   Nyoka Lint, PA-C  azithromycin (ZITHROMAX Z-PAK) 250 MG tablet 2 tablets initially and then 1 tablet daily until completed. 05/21/22   Nyoka Lint, PA-C  dextromethorphan-guaiFENesin [facility] DM) 30-600 MG 12hr tablet Take 1 tablet by mouth 2 (two) times daily. 05/21/22   Nyoka Lint, PA-C  diclofenac Sodium (VOLTAREN) 1 % GEL Apply 2 g topically 4 (four) times daily as needed. 03/20/22   Long, Wonda Olds, MD  gabapentin (NEURONTIN) 300 MG capsule Take 1 capsule (300 mg total) by mouth 3 (three) times daily. 03/19/22   Dorna Mai, MD  ibuprofen (ADVIL) 800 MG tablet Take 800 mg by mouth 3 (three) times daily. 04/03/22   [provider]  lisinopril-hydrochlorothiazide (ZESTORETIC) 20-25 MG tablet Take 1 tablet by mouth daily. 03/19/22   Dorna Mai, MD  methocarbamol (ROBAXIN) 500 MG tablet Take 1 tablet (500 mg total) by mouth every 8  (eight) hours as needed for muscle spasms. 03/20/22   Long, Wonda Olds, MD  montelukast (SINGULAIR) 10 MG tablet Take 1 tablet (10 mg total) by mouth at bedtime. 03/19/22   Dorna Mai, MD  omeprazole (PRILOSEC) 40 MG capsule Take 1 capsule (40 mg total) by mouth daily. 03/19/22   Dorna Mai, MD  promethazine-dextromethorphan (PROMETHAZINE-DM) 6.25-15 MG/5ML syrup Take 5 mLs by mouth 4 (four) times daily as needed for cough. [id number]   Prince Rome, PA-C  Spacer/Aero-Holding Josiah Lobo DEVI 1 Units by Does not apply route in the morning, at noon, and at bedtime. 05/21/22   Nyoka Lint, PA-C      Allergies    Patient has no known allergies.    Review of Systems   Review of Systems  Physical Exam Updated Vital Signs BP (!) 161/80 (BP Location: Left Arm)   Pulse 96   Temp 99.2 F (37.3 C) (Oral)   Resp 18   LMP 01/04/2015   SpO2 99%  Physical Exam Vitals and nursing note reviewed.  Constitutional:      General: She is not in acute distress.    Appearance: She is well-developed. She is not diaphoretic.  HENT:     Head: Normocephalic and atraumatic.  Eyes:     Pupils: Pupils are equal, round, and reactive to light.  Neck:     Comments: The patient describes swelling to the left side  of her neck.  No obvious appreciable difference from the right to the left.  No erythema no warmth no tenderness.  Posterior oropharynx with out any obvious edema or erythema.  Tolerating secretions without difficulty.  No sublingual swelling. Cardiovascular:     Rate and Rhythm: Normal rate and regular rhythm.     Heart sounds: No murmur heard.    No friction rub. No gallop.  Pulmonary:     Effort: Pulmonary effort is normal.     Breath sounds: No wheezing or rales.  Abdominal:     General: There is no distension.     Palpations: Abdomen is soft.     Tenderness: There is no abdominal tenderness.  Musculoskeletal:        General: No tenderness.     Cervical back: Normal range of motion  and neck supple.  Skin:    General: Skin is warm and dry.  Neurological:     Mental Status: She is alert and oriented to person, place, and time.  Psychiatric:        Behavior: Behavior normal.     ED Results / Procedures / Treatments   Labs (all labs ordered are listed, but only abnormal results are displayed) Labs Reviewed  BASIC METABOLIC PANEL - Abnormal; Notable for the following components:      Result Value   Creatinine, Ser 1.08 (*)    GFR, Estimated 60 (*)    Anion gap 3 (*)    All other components within normal limits  CBC WITH DIFFERENTIAL/PLATELET    EKG None  Radiology CT Soft Tissue Neck W Contrast  Result Date: 06/19/2022 CLINICAL DATA:  Hoarseness, normal laryngeal exam. Left neck swelling. EXAM: CT NECK WITH CONTRAST TECHNIQUE: Multidetector CT imaging of the neck was performed using the standard protocol following the bolus administration of intravenous contrast. RADIATION DOSE REDUCTION: This exam was performed according to the departmental dose-optimization program which includes automated exposure control, adjustment of the mA and/or kV according to patient size and/or use of iterative reconstruction technique. CONTRAST:  48m OMNIPAQUE IOHEXOL 300 MG/ML  SOLN COMPARISON:  None Available. FINDINGS: Pharynx and larynx: Prominent palatine and lingual tonsils narrow the oropharyngeal airway. Glottis is closed. No definite mass or edema. Salivary glands: No inflammation, mass, or stone. Thyroid: Normal. Lymph nodes: Mild asymmetric prominence of the left level 5 lymph nodes with benign morphology, likely reactive. Vascular: Medialized common and internal carotid arteries. Limited intracranial: Unremarkable. Visualized orbits: Normal. Mastoids and visualized paranasal sinuses: Well aerated. Skeleton: Mild degenerative changes of the lower cervical spine with no more than mild spinal canal stenosis at C5-6 and C6-7. Upper chest: Atherosclerotic calcifications of the aortic  arch. Other: None. IMPRESSION: 1. Prominent palatine and lingual tonsils narrow the oropharyngeal airway. No definite mass or edema. 2. Mild asymmetric prominence of the left level 5 lymph nodes with benign morphology, likely reactive. 3. Aortic Atherosclerosis (ICD10-I70.0). Electronically Signed   By: WEmmit AlexandersM.D.   On: 06/19/2022 10:24    Procedures Procedures    Medications Ordered in ED Medications  iohexol (OMNIPAQUE) 300 MG/ML solution 80 mL (80 mLs Intravenous Contrast Given 06/19/22 0948)    ED Course/ Medical Decision Making/ A&P                             Medical Decision Making Amount and/or Complexity of Data Reviewed Labs: ordered. Radiology: ordered.  Risk Prescription drug management.   57yo F  with a chief complaints of hoarse voice.  Going on for some time has seen her family doctor as well as a specialist and had been scoped by ENT in the past.  She is here for worsening left-sided neck swelling.  She has been in close communication with her PCP and they had attempted to get a outpatient CT scan of the neck and have not yet been successful and encouraged her to come here for evaluation.  Will obtain CT imaging here.  No significant laboratory evaluation, no anemia no leukocytosis.  CT soft tissue neck with prominent tonsils.  I discussed this with Dr. Redmond Baseman, ENT.  Felt no reason for any change to her medical management today.  Thought having her follow-up back in the office would be the most helpful.  12:21 PM:  I have discussed the diagnosis/risks/treatment options with the patient.  Evaluation and diagnostic testing in the emergency department does not suggest an emergent condition requiring admission or immediate intervention beyond what has been performed at this time.  They will follow up with ENT. We also discussed returning to the ED immediately if new or worsening sx occur. We discussed the sx which are most concerning (e.g., sudden worsening pain,  fever, inability to tolerate by mouth) that necessitate immediate return. Medications administered to the patient during their visit and any new prescriptions provided to the patient are listed below.  Medications given during this visit Medications  iohexol (OMNIPAQUE) 300 MG/ML solution 80 mL (80 mLs Intravenous Contrast Given 06/19/22 0948)     The patient appears reasonably screen and/or stabilized for discharge and I doubt any other medical condition or other [facility] requiring further screening, evaluation, or treatment in the ED at this time prior to discharge.          Final Clinical Impression(s) / ED Diagnoses Final diagnoses:  Hoarseness of voice    Rx / DC Orders ED Discharge Orders     None         Deno Etienne, DO 06/19/22 1221

## 2022-06-19 NOTE — ED Triage Notes (Signed)
Lost her voice 05/30/22, swelling to left neck going around , saw ENT and was diagnosed , here for neck swelling

## 2022-06-19 NOTE — Discharge Instructions (Signed)
Please follow-up with the ear nose and throat physician in the office.  Please return for worsening symptoms.

## 2022-06-24 ENCOUNTER — Telehealth: Payer: Self-pay | Admitting: *Deleted

## 2022-06-24 NOTE — Telephone Encounter (Signed)
Copied from [place] [id number]. Topic: General - Other >> Jun 23, 2022  8:12 AM Oley Balm A wrote: Reason for CRM: Pt states that she is needing a new note for work with the date of 06/09/22 on it and her return to work date. Pt states she does not know when PCP will be sending her back to work.  Please advise

## 2022-06-24 NOTE — Telephone Encounter (Signed)
Patient was called and letter has been written Copied from [place] [id number]. Topic: General - Other >> Jun 23, 2022  8:12 AM Oley Balm A wrote: Reason for CRM: Pt states that she is needing a new note for work with the date of 06/09/22 on it and her return to work date. Pt states she does not know when PCP will be sending her back to work.  Please advise

## 2022-06-25 ENCOUNTER — Ambulatory Visit: Payer: Commercial Managed Care - HMO | Attending: Physician Assistant | Admitting: Speech Pathology

## 2022-06-25 DIAGNOSIS — R490 Dysphonia: Secondary | ICD-10-CM | POA: Insufficient documentation

## 2022-06-25 NOTE — Therapy (Signed)
OUTPATIENT SPEECH LANGUAGE PATHOLOGY TREATMENT NOTE   Patient Name: Kristen Woods MRN: [id number] DOB:1965-11-24, 57 y.o., female Today's Date: 06/25/2022  PCP: Dorna Mai, MD REFERRING PROVIDER: [place], Camelia Eng, PA-C  END OF SESSION:   End of Session - 06/25/22 1148     Visit Number 2    Number of Visits 7    Date for SLP Re-Evaluation 08/18/22    SLP Start Time 49    SLP Stop Time  37    SLP Time Calculation (min) 47 min    Activity Tolerance Patient tolerated treatment well             Past Medical History:  Diagnosis Date   Back pain    Chronic fatigue syndrome    Depression    High cholesterol    Hypertension    Joint pain    Joint pain    Leg pain    Liver disease    Nerves    Sleep apnea    Swelling of lower extremity    Vision abnormalities    Past Surgical History:  Procedure Laterality Date   BREAST SURGERY     INCONTINENCE SURGERY     KNEE SURGERY     Patient Active Problem List   Diagnosis Date Noted   Eating disorder 05/22/2022   Pre-diabetes 02/06/2022   Other fatigue 02/04/2022   SOB (shortness of breath) on exertion 02/04/2022   Essential hypertension 02/04/2022   Health care maintenance 02/04/2022   Other hyperlipidemia 02/04/2022   Elevated glucose 02/04/2022   Vitamin D deficiency 02/04/2022   Morbid obesity ([place]) 02/04/2022   Unilateral primary osteoarthritis, right knee 12/16/2021   History of total left knee replacement 12/16/2021   Obstructive sleep apnea syndrome 03/05/2021   Stress incontinence of urine 03/05/2021   Mixed hyperlipidemia 09/01/2019   Chronic anemia 06/13/2019   DDD (degenerative disc disease), cervical 06/13/2019   DDD (degenerative disc disease), lumbosacral 06/13/2019   Peripheral edema 06/13/2019   Gastroesophageal reflux disease 06/13/2019   History of colon polyps 06/13/2019   Mild intermittent asthma 06/13/2019   Lower back pain 01/12/2015   Neck pain 10/05/2014   Left arm pain  10/05/2014   Dysesthesia 10/05/2014   Shoulder pain, left 10/05/2014   Left knee pain 08/02/2012   Right wrist pain 02/09/2012   Left leg pain 11/17/2011    Onset date: Reportedly ~4 weeks ago   REFERRING DIAG: R49.0 (ICD-10-CM) - Dysphonia   THERAPY DIAG:  Dysphonia  Rationale for Evaluation and Treatment: Rehabilitation  SUBJECTIVE:   SUBJECTIVE STATEMENT:  PAIN:  Are you having pain? Yes NPRS scale: 7/10 Pain location: L jaw, neck  Pain orientation: Left  PAIN TYPE: aching and burning Pain description: intermittent  Aggravating factors: pressure Relieving factors: limited movement   OBJECTIVE:   TODAY'S TREATMENT:  DATE:   06/25/22: Pt seen for skilled ST services with focus on education. Due to high patient copay, SLP to see additional session to ensure understanding of recommendations. Pt is extremely frustrated with limited answers she has received regarding throat, neck, and chest discomfort/swelling. Pt received CT scan of neck (but not chest). See Imaging for full results. SLP was able to see lump on chest during today's session. Pt reports it gets larger when neck feels tighter.   Provided edu on re: diaphragmatic breathing, "sniff, sniff, sniff exhale /s/" when experiencing a suspected VCD episode. Pt reported she has not had another episode since she was in my office. Pt belched during today's session and then proceeded to cough immediately.   SLP also provided pt with vocal hygiene strategies to review. Pt to call with any questions.   PATIENT EDUCATION: Education details: Muscle Tension Dysphonia, VCD Person educated: Patient Education method: Explanation Education comprehension: needs further education       GOALS: Goals reviewed with patient? Yes   SHORT TERM GOALS: Target date: 07/18/22   Pt will demonstrate use of  SOVTE with minA verbal cues.  Baseline: Goal status: INITIAL   2.  Pt will demonstrate use of relaxation techniques (diaphragmatic breathing, visualization etc) with minA verbal cues.  Baseline:  Goal status: INITIAL   3.  Pt will recall 3 vocal hygiene strategies independently.  Baseline:  Goal status: INITIAL       LONG TERM GOALS: Target date: 08/18/22   Pt will decrease score on VCD-Q. Baseline:  Goal status: INITIAL   2.  Pt will report successful use of relaxation exercises during an "episode".  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  ENT laryngoscopy results were unremarkable.  Upon entering therapy room, patient was observed to be completely aphonic.  No vocalization was heard during speech for the entirety of today's evaluation.  SLP observed one vocalization during today's assessment when patient was laughing.  Patient was unable to hum or produce any voice during sustained phonation.  Patient was observed to strain when speaking and attempt to produce voice.  SLP explained this could impact voice production.  SLP completed thorough voice case history in which patient reported aching pain in left neck/shoulder and reported swelling on left side and into chest.  SLP noted minimal swelling left side of neck; however, noted a lump on chest this date.  Patient reported it was recommended she receive a CT scan, but has not been completed yet .  During today's session, patient appeared to have a sudden onset of an episode. The following was observed re:  Loud cough Inability to inhale lasting ~10 seconds Requiring prompting from SLP and attempt to relax and breath through nose Shortness of breath following incident Immediate tears   SLP inquired if this was common for patient.  Patient reports this has been occurring for the  past 3-1/2 weeks.  Patient reports after the episode she immediately becomes short of breath and dizzy, as if she is about to pass out.  Patient reports she has not lost consciousness; however, has been close.  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 Patient reported this describes her symptoms and does not know if there is a trigger.  Patient reports she does not always cough prior to  the episodes.  Patient is currently living in an environment that may contain several irritants and potential infestation. She also reports significant hx of acid reflux. While muscle tension dysphonia is likely; based off observation, SLP also suspects pt is suffering from VCD. SLP to reach out to ENT for next steps to confirm or r/o diagnosis.    SLP rec skilled ST services to address dysphonia with unknown etiology.  SLP to treat muscle tension dysphonia as well as suspected VCD with vocal hygiene, SOVTE, and relaxation techniques.   OBJECTIVE IMPAIRMENTS: include voice disorder. These impairments are limiting patient from return to work and effectively communicating at home and in community. Factors affecting potential to achieve goals and functional outcome are  unknown etiology .Marland Kitchen Patient will benefit from skilled SLP services to address above impairments and improve overall function.   REHAB POTENTIAL: Good   PLAN:   SLP FREQUENCY: 1x/week due to monetary concerns   SLP DURATION: 6 weeks   PLANNED INTERVENTIONS: Environmental controls, Cueing hierachy, Internal/external aids, SLP instruction and feedback, Compensatory strategies, and Patient/family education     [place], [place] 06/25/2022, 11:49 AM

## 2022-06-30 ENCOUNTER — Other Ambulatory Visit: Payer: Self-pay | Admitting: *Deleted

## 2022-06-30 MED ORDER — OMEPRAZOLE 40 MG PO CPDR: 40.0000 mg | DELAYED_RELEASE_CAPSULE | Freq: Every day | ORAL | 3 refills | Status: DC

## 2022-07-01 ENCOUNTER — Telehealth (INDEPENDENT_AMBULATORY_CARE_PROVIDER_SITE_OTHER): Payer: 59 | Admitting: Psychology

## 2022-07-01 ENCOUNTER — Encounter (INDEPENDENT_AMBULATORY_CARE_PROVIDER_SITE_OTHER): Payer: Self-pay

## 2022-07-03 ENCOUNTER — Encounter: Payer: Self-pay | Admitting: Family Medicine

## 2022-07-03 NOTE — Telephone Encounter (Signed)
CT results requested

## 2022-07-07 ENCOUNTER — Encounter: Payer: Self-pay | Admitting: *Deleted

## 2022-07-10 ENCOUNTER — Ambulatory Visit: Payer: 59 | Admitting: Nurse Practitioner

## 2022-07-14 ENCOUNTER — Telehealth: Payer: 59 | Admitting: Family Medicine

## 2022-07-15 ENCOUNTER — Encounter: Payer: Self-pay | Admitting: Family Medicine

## 2022-07-15 ENCOUNTER — Ambulatory Visit (INDEPENDENT_AMBULATORY_CARE_PROVIDER_SITE_OTHER): Payer: Commercial Managed Care - HMO | Admitting: Family Medicine

## 2022-07-15 VITALS — BP 131/86 | HR 73 | Temp 98.1°F | Resp 16 | Ht 66.0 in | Wt 305.0 lb

## 2022-07-15 DIAGNOSIS — Z1329 Encounter for screening for other suspected endocrine disorder: Secondary | ICD-10-CM | POA: Diagnosis not present

## 2022-07-15 DIAGNOSIS — Z6841 Body Mass Index (BMI) 40.0 and over, adult: Secondary | ICD-10-CM

## 2022-07-15 DIAGNOSIS — Z1231 Encounter for screening mammogram for malignant neoplasm of breast: Secondary | ICD-10-CM

## 2022-07-15 DIAGNOSIS — Z13220 Encounter for screening for lipoid disorders: Secondary | ICD-10-CM

## 2022-07-15 DIAGNOSIS — E669 Obesity, unspecified: Secondary | ICD-10-CM

## 2022-07-15 DIAGNOSIS — Z13228 Encounter for screening for other metabolic disorders: Secondary | ICD-10-CM

## 2022-07-15 DIAGNOSIS — Z130 Encounter for screening for diseases of the blood and blood-forming organs and certain disorders involving the immune mechanism: Secondary | ICD-10-CM

## 2022-07-15 DIAGNOSIS — Z0000 Encounter for general adult medical examination without abnormal findings: Secondary | ICD-10-CM

## 2022-07-15 DIAGNOSIS — Z114 Encounter for screening for human immunodeficiency virus [HIV]: Secondary | ICD-10-CM

## 2022-07-15 DIAGNOSIS — Z1159 Encounter for screening for other viral diseases: Secondary | ICD-10-CM

## 2022-07-15 NOTE — Progress Notes (Unsigned)
Patient is herr for complete physical examination . Patient would like some FMLA papers done

## 2022-07-15 NOTE — Progress Notes (Unsigned)
New Patient Office Visit  Subjective    Patient ID: Kristen Woods, female    DOB: 08-Jan-1966  Age: 57 y.o. MRN: [id number]  CC:  Chief Complaint  Patient presents with   Annual Exam    HPI Kristen Woods presents to establish care ***  Outpatient Encounter Medications as of 07/15/2022  Medication Sig   bumetanide (BUMEX) 1 MG tablet Take 1 tablet by mouth daily.   fluticasone (FLOVENT HFA) 110 MCG/ACT inhaler Inhale into the lungs.   losartan-hydrochlorothiazide (HYZAAR) 50-12.5 MG tablet Take 1 tablet by mouth daily.   meloxicam (MOBIC) 15 MG tablet Take by mouth.   potassium chloride (KLOR-CON) 10 MEQ tablet Take 1 tablet by mouth daily.   tolterodine (DETROL LA) 4 MG 24 hr capsule Take by mouth.   albuterol (VENTOLIN HFA) 108 (90 Base) MCG/ACT inhaler Inhale 2 puffs into the lungs every 6 (six) hours as needed for wheezing or shortness of breath.   azithromycin (ZITHROMAX Z-PAK) 250 MG tablet 2 tablets initially and then 1 tablet daily until completed.   dextromethorphan-guaiFENesin (MUCINEX DM) 30-600 MG 12hr tablet Take 1 tablet by mouth 2 (two) times daily.   diclofenac Sodium (VOLTAREN) 1 % GEL Apply 2 g topically 4 (four) times daily as needed.   gabapentin (NEURONTIN) 300 MG capsule Take 1 capsule (300 mg total) by mouth 3 (three) times daily.   ibuprofen (ADVIL) 800 MG tablet Take 800 mg by mouth 3 (three) times daily.   lisinopril-hydrochlorothiazide (ZESTORETIC) 20-25 MG tablet Take 1 tablet by mouth daily.   methocarbamol (ROBAXIN) 500 MG tablet Take 1 tablet (500 mg total) by mouth every 8 (eight) hours as needed for muscle spasms.   montelukast (SINGULAIR) 10 MG tablet Take 1 tablet (10 mg total) by mouth at bedtime.   omeprazole (PRILOSEC) 40 MG capsule Take 1 capsule (40 mg total) by mouth daily.   promethazine-dextromethorphan (PROMETHAZINE-DM) 6.25-15 MG/5ML syrup Take 5 mLs by mouth 4 (four) times daily as needed for cough.   Spacer/Aero-Holding Chambers  DEVI 1 Units by Does not apply route in the morning, at noon, and at bedtime.   No facility-administered encounter medications on file as of 07/15/2022.    Past Medical History:  Diagnosis Date   Back pain    Chronic fatigue syndrome    Depression    High cholesterol    Hypertension    Joint pain    Joint pain    Leg pain    Liver disease    Nerves    Sleep apnea    Swelling of lower extremity    Vision abnormalities     Past Surgical History:  Procedure Laterality Date   BREAST SURGERY     INCONTINENCE SURGERY     KNEE SURGERY      Family History  Problem Relation Age of Onset   Hypertension Mother    Heart disease Mother    Diabetes Father    Hyperlipidemia Other    Heart attack Neg Hx    Sudden death Neg Hx     Social History   Socioeconomic History   Marital status: Single    Spouse name: Not on file   Number of children: Not on file   Years of education: Not on file   Highest education level: Not on file  Occupational History   Not on file  Tobacco Use   Smoking status: Never   Smokeless tobacco: Not on file  Substance and Sexual Activity   Alcohol use:  No   Drug use: No   Sexual activity: Not on file  Other Topics Concern   Not on file  Social History Narrative   Not on file   Social Determinants of Health   Financial Resource Strain: Not on file  Food Insecurity: Not on file  Transportation Needs: Not on file  Physical Activity: Not on file  Stress: Not on file  Social Connections: Not on file  Intimate Partner Violence: Not on file    ROS      Objective    BP 131/86   Pulse 73   Temp 98.1 F (36.7 C) (Oral)   Resp 16   Ht 5\' 6"  (1.676 m)   Wt (!) 305 lb (138.3 kg)   LMP 01/04/2015   SpO2 94%   BMI 49.23 kg/m   Physical Exam  {Labs (Optional):23779}    Assessment & Plan:   Problem List Items Addressed This Visit   None Visit Diagnoses     Annual physical exam    -  Primary   Relevant Orders   CMP14+EGFR    Screening for deficiency anemia       Relevant Orders   CBC with Differential   Screening for lipid disorders       Relevant Orders   Lipid Panel   Screening for endocrine/metabolic/immunity disorders       Relevant Orders   TSH   Vitamin D, 25-hydroxy   Encounter for screening mammogram for malignant neoplasm of breast       Relevant Orders   MM Digital Screening   Screening for HIV (human immunodeficiency virus)       Relevant Orders   HIV antibody (with reflex)   Need for hepatitis C screening test       Relevant Orders   Hepatitis C Antibody       No follow-ups on file.   Kristen Sax, MD

## 2022-07-16 ENCOUNTER — Encounter: Payer: Self-pay | Admitting: Family Medicine

## 2022-07-16 LAB — CBC WITH DIFFERENTIAL/PLATELET
Basophils Absolute: 0 10*3/uL (ref 0.0–0.2)
Basos: 0 %
EOS (ABSOLUTE): 0.3 10*3/uL (ref 0.0–0.4)
Eos: 5 %
Hematocrit: 38.5 % (ref 34.0–46.6)
Hemoglobin: 12.6 g/dL (ref 11.1–15.9)
Immature Grans (Abs): 0 10*3/uL (ref 0.0–0.1)
Immature Granulocytes: 0 %
Lymphocytes Absolute: 1.8 10*3/uL (ref 0.7–3.1)
Lymphs: 34 %
MCH: 28.8 pg (ref 26.6–33.0)
MCHC: 32.7 g/dL (ref 31.5–35.7)
MCV: 88 fL (ref 79–97)
Monocytes Absolute: 0.4 10*3/uL (ref 0.1–0.9)
Monocytes: 7 %
Neutrophils Absolute: 2.8 10*3/uL (ref 1.4–7.0)
Neutrophils: 54 %
Platelets: 384 10*3/uL (ref 150–450)
RBC: 4.38 x10E6/uL (ref 3.77–5.28)
RDW: 12.8 % (ref 11.7–15.4)
WBC: 5.3 10*3/uL (ref 3.4–10.8)

## 2022-07-16 LAB — HIV ANTIBODY (ROUTINE TESTING W REFLEX): HIV Screen 4th Generation wRfx: NONREACTIVE

## 2022-07-16 LAB — CMP14+EGFR
ALT: 11 IU/L (ref 0–32)
AST: 12 IU/L (ref 0–40)
Albumin/Globulin Ratio: 1.5 (ref 1.2–2.2)
Albumin: 4 g/dL (ref 3.8–4.9)
Alkaline Phosphatase: 127 IU/L — ABNORMAL HIGH (ref 44–121)
BUN/Creatinine Ratio: 14 (ref 9–23)
BUN: 14 mg/dL (ref 6–24)
Bilirubin Total: 0.3 mg/dL (ref 0.0–1.2)
CO2: 23 mmol/L (ref 20–29)
Calcium: 9.2 mg/dL (ref 8.7–10.2)
Chloride: 103 mmol/L (ref 96–106)
Creatinine, Ser: 0.98 mg/dL (ref 0.57–1.00)
Globulin, Total: 2.6 g/dL (ref 1.5–4.5)
Glucose: 92 mg/dL (ref 70–99)
Potassium: 4 mmol/L (ref 3.5–5.2)
Sodium: 141 mmol/L (ref 134–144)
Total Protein: 6.6 g/dL (ref 6.0–8.5)
eGFR: 67 mL/min/{1.73_m2} (ref >59)

## 2022-07-16 LAB — TSH: TSH: 1.3 u[IU]/mL (ref 0.450–4.500)

## 2022-07-16 LAB — VITAMIN D 25 HYDROXY (VIT D DEFICIENCY, FRACTURES): Vit D, 25-Hydroxy: 39.1 ng/mL (ref 30.0–100.0)

## 2022-07-16 LAB — LIPID PANEL
Chol/HDL Ratio: 3.2 ratio (ref 0.0–4.4)
Cholesterol, Total: 218 mg/dL — ABNORMAL HIGH (ref 100–199)
HDL: 68 mg/dL (ref >39)
LDL Chol Calc (NIH): 139 mg/dL — ABNORMAL HIGH (ref 0–99)
Triglycerides: 60 mg/dL (ref 0–149)
VLDL Cholesterol Cal: 11 mg/dL (ref 5–40)

## 2022-07-16 LAB — HEPATITIS C ANTIBODY: Hep C Virus Ab: NONREACTIVE

## 2022-07-17 ENCOUNTER — Encounter: Payer: Self-pay | Admitting: Family Medicine

## 2022-07-18 ENCOUNTER — Other Ambulatory Visit: Payer: Self-pay | Admitting: Family Medicine

## 2022-07-18 MED ORDER — DILTIAZEM HCL ER 180 MG PO TB24: 180.0000 mg | ORAL_TABLET | Freq: Every day | ORAL | 0 refills | Status: DC

## 2022-07-22 ENCOUNTER — Ambulatory Visit (INDEPENDENT_AMBULATORY_CARE_PROVIDER_SITE_OTHER): Payer: Medicaid Other | Admitting: Psychology

## 2022-07-22 DIAGNOSIS — F418 Other specified anxiety disorders: Secondary | ICD-10-CM

## 2022-07-22 DIAGNOSIS — F3289 Other specified depressive episodes: Secondary | ICD-10-CM
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
## 2022-07-23 ENCOUNTER — Ambulatory Visit: Payer: Self-pay | Admitting: Nurse Practitioner

## 2022-07-25 ENCOUNTER — Ambulatory Visit: Payer: Self-pay

## 2022-07-25 NOTE — Telephone Encounter (Signed)
Patient called said her throat is still sore and she is having sharp pain on the left side. She went to the ENT and he did not do anything to help her except check her tonsils which were fine. Patient is needing something done as she is going on 6 weeks with this pain     Chief Complaint: Pt. Having continued sore throat, neck pain, laryngitis x 6 weeks. Has seen ENT and had CT scan. Asking PCP what else can be done. Symptoms: Above Frequency: 6 weeks Pertinent Negatives: Patient denies  Disposition: [] ED /[] Urgent Care (no appt availability in office) / [] Appointment(In office/virtual)/ []  [facility]/ [] Home Care/ [] Refused Recommended Disposition /[] [facility]/ [x]  Follow-up with PCP Additional Notes: Please advise pt.   Answer Assessment - Initial Assessment Questions 1. ONSET: "When did the throat start hurting?" (Hours or days ago)      6 weeks 2. SEVERITY: "How bad is the sore throat?" (Scale 1-10; mild, moderate or severe)   - MILD (1-3):  Doesn't interfere with eating or normal activities.   - MODERATE (4-7): Interferes with eating some solids and normal activities.   - SEVERE (8-10):  Excruciating pain, interferes with most normal activities.   - SEVERE WITH DYSPHAGIA (10): Can't swallow liquids, drooling.     Now - neck is sore 3. STREP EXPOSURE: "Has there been any exposure to strep within the past week?" If Yes, ask: "What type of contact occurred?"      No 4.  VIRAL SYMPTOMS: "Are there any symptoms of a cold, such as a runny nose, cough, hoarse voice or red eyes?"      Hoarse voice 5. FEVER: "Do you have a fever?" If Yes, ask: "What is your temperature, how was it measured, and when did it start?"     No 6. PUS ON THE TONSILS: "Is there pus on the tonsils in the back of your throat?"     No 7. OTHER SYMPTOMS: "Do you have any other symptoms?" (e.g., difficulty breathing, headache, rash)     No 8. PREGNANCY: "Is there any chance you are pregnant?"  "When was your last menstrual period?"     No  Protocols used: Sore Throat-A-AH

## 2022-07-25 NOTE — Telephone Encounter (Signed)
Please advise patient.  

## 2022-07-28 ENCOUNTER — Encounter: Payer: Self-pay | Admitting: Family Medicine

## 2022-07-29 ENCOUNTER — Ambulatory Visit (INDEPENDENT_AMBULATORY_CARE_PROVIDER_SITE_OTHER): Payer: Commercial Managed Care - HMO | Admitting: Psychology

## 2022-07-29 DIAGNOSIS — F418 Other specified anxiety disorders: Secondary | ICD-10-CM | POA: Diagnosis not present

## 2022-07-29 DIAGNOSIS — F3289 Other specified depressive episodes: Secondary | ICD-10-CM | POA: Diagnosis not present

## 2022-07-29 NOTE — Progress Notes (Signed)
Patient deferred video visit 

## 2022-07-29 NOTE — Progress Notes (Signed)
[facility] Counselor/Therapist Progress Note  Patient ID: Kristen Woods, MRN: [id number]    Date: 07/29/22  Time Spent: 1:01  pm - 2:02 pm : 61 Minutes  Treatment Type: Individual Therapy.  Reported Symptoms: depression and anxiety  Mental Status Exam: Appearance:  Casual     Behavior: Appropriate  Motor: Normal  Speech/Language:  Clear and Coherent  Affect: Flat  Mood: dysthymic  Thought process: normal  Thought content:   WNL  Sensory/Perceptual disturbances:   WNL  Orientation: oriented to person, place, time/date, and situation  Attention: Good  Concentration: Good  Memory: WNL  Fund of knowledge:  Good  Insight:   Good  Judgment:  Good  Impulse Control: Good   Risk Assessment: Danger to Self:  No Self-injurious Behavior: No Danger to Others: No Duty to Warn:no Physical Aggression / Violence:No  Access to Firearms a concern: No  Gang Involvement:No   Subjective:   Kristen Woods participated in the session, in person in the office with the therapist, and consented to treatment Kristen Woods reviewed the events of the past week. We reviewed numerous treatment approaches including CBT, BA, Problem Solving, and Solution focused therapy. Psych-education regarding the Kristen Woods's diagnosis of Other depression  Other specified anxiety disorders was provided during the session. We discussed Kristen Woods goals treatment goals which include reduce castrophizing, improve self-talk, build and maintain positive habits, process past events and life stressors, processing the loss of mother and family dynamics after passing, processing the care-taker role of mom and lack of family support, process abandonment by family. Additional goals include bolstering coping skills, creating a healthful daily routine, engaging in consistent self-care, improve mindfulness, reduce rumination, and build a sense of self. Kristen Woods provided verbal approval of the treatment  plan. She noted that she will consider a psychiatric consult in the future.   Interventions: Psycho-education & Goal Setting.   Diagnosis:   Other depression  Other specified anxiety disorders  Psychiatric Treatment: No , but will be referred to psychiatric provider.   Treatment Plan:  Client Abilities/Strengths Kristen Woods is intelligent, well spoken, and expressive.   Support System: Family and friends.   Client Treatment Preferences Outpatient therapy.   Client Statement of Needs Kristen Woods would like to reduce castrophizing, improve self-talk, build and maintain positive habits, process past events and life stressors, processing the loss of mother and family dynamics after passing, processing the care-taker role of mom and lack of family support, process abandonment by family. Additional goals include bolstering coping skills, creating a healthful daily routine, engaging in consistent self-care, improve mindfulness, reduce rumination, and build a sense of self.   Treatment Level Weekly  Symptoms  Anxiety: feeling nervous, difficulty managing worry, worrying about different things, trouble relaxing.    (Status: maintained) Depression:  loss of interest, feeling down, poor sleep, lethargy, fluctuating appetite, feeling bad about self, difficulty concentrating, emotional eating.   (Status: maintained)  Goals:   Kristen Woods experiences symptoms of depression and anxiety.    Target Date: 07/29/23 Frequency: Weekly  Progress: 0 Modality: individual    Therapist will provide referrals for additional resources as appropriate.  Therapist will provide psycho-education regarding Kristen Woods's diagnosis and corresponding treatment approaches and interventions. Licensed Clinical Social Worker, [place], LCSW will support the patient's ability to achieve the goals identified. will employ CBT, BA, Problem-solving, Solution Focused, Mindfulness,  coping skills, & other evidenced-based  practices will be used to promote progress towards healthy functioning to help manage decrease symptoms associated with her diagnosis.  Reduce overall level, frequency, and intensity of the feelings of depression, anxiety and panic evidenced by decreased overall symptoms from 6 to 7 days/week to 0 to 1 days/week per client report for at least 3 consecutive months. Verbally express understanding of the relationship between feelings of depression, anxiety and their impact on thinking patterns and behaviors. Verbalize an understanding of the role that distorted thinking plays in creating fears, excessive worry, and ruminations.  Kristen Woods participated in the creation of the treatment plan)    Delight Ovens, LCSW

## 2022-08-01 ENCOUNTER — Emergency Department (HOSPITAL_COMMUNITY)
Admission: EM | Admit: 2022-08-01 | Discharge: 2022-08-01 | Disposition: A | Discharge status: Home / Self Care | Payer: Commercial Managed Care - HMO | Attending: Emergency Medicine | Admitting: Emergency Medicine

## 2022-08-01 ENCOUNTER — Other Ambulatory Visit: Payer: Self-pay

## 2022-08-01 ENCOUNTER — Emergency Department (HOSPITAL_COMMUNITY): Payer: Commercial Managed Care - HMO

## 2022-08-01 DIAGNOSIS — R490 Dysphonia: Secondary | ICD-10-CM | POA: Diagnosis not present

## 2022-08-01 DIAGNOSIS — R0789 Other chest pain: Secondary | ICD-10-CM | POA: Diagnosis not present

## 2022-08-01 DIAGNOSIS — M549 Dorsalgia, unspecified: Secondary | ICD-10-CM | POA: Diagnosis not present

## 2022-08-01 DIAGNOSIS — M25512 Pain in left shoulder: Secondary | ICD-10-CM | POA: Insufficient documentation

## 2022-08-01 DIAGNOSIS — M542 Cervicalgia: Secondary | ICD-10-CM | POA: Insufficient documentation

## 2022-08-01 DIAGNOSIS — M791 Myalgia, unspecified site: Secondary | ICD-10-CM | POA: Diagnosis not present

## 2022-08-01 LAB — CBC
HCT: 39 % (ref 36.0–46.0)
Hemoglobin: 12.7 g/dL (ref 12.0–15.0)
MCH: 29.1 pg (ref 26.0–34.0)
MCHC: 32.6 g/dL (ref 30.0–36.0)
MCV: 89.2 fL (ref 80.0–100.0)
Platelets: 401 10*3/uL — ABNORMAL HIGH (ref 150–400)
RBC: 4.37 MIL/uL (ref 3.87–5.11)
RDW: 13.5 % (ref 11.5–15.5)
WBC: 5.4 10*3/uL (ref 4.0–10.5)
nRBC: 0 % (ref 0.0–0.2)

## 2022-08-01 LAB — BASIC METABOLIC PANEL
Anion gap: 7 (ref 5–15)
BUN: 12 mg/dL (ref 6–20)
CO2: 26 mmol/L (ref 22–32)
Calcium: 8.9 mg/dL (ref 8.9–10.3)
Chloride: 106 mmol/L (ref 98–111)
Creatinine, Ser: 1.07 mg/dL — ABNORMAL HIGH (ref 0.44–1.00)
GFR, Estimated: >60 mL/min (ref >60)
Glucose, Bld: 97 mg/dL (ref 70–99)
Potassium: 3.9 mmol/L (ref 3.5–5.1)
Sodium: 139 mmol/L (ref 135–145)

## 2022-08-01 MED ORDER — KETOROLAC TROMETHAMINE 30 MG/ML IJ SOLN
30.0000 mg | Freq: Once | INTRAMUSCULAR | Status: AC
  Administered 2022-08-01: 30 mg via INTRAVENOUS
  Filled 2022-08-01: qty 1

## 2022-08-01 MED ORDER — CYCLOBENZAPRINE HCL 10 MG PO TABS: 10.0000 mg | ORAL_TABLET | Freq: Two times a day (BID) | ORAL | 0 refills | Status: DC | PRN

## 2022-08-01 MED ORDER — IOHEXOL 350 MG/ML SOLN
50.0000 mL | Freq: Once | INTRAVENOUS | Status: AC | PRN
  Administered 2022-08-01: 50 mL via INTRAVENOUS

## 2022-08-01 MED ORDER — DEXAMETHASONE SODIUM PHOSPHATE 10 MG/ML IJ SOLN
6.0000 mg | Freq: Once | INTRAMUSCULAR | Status: AC
  Administered 2022-08-01: 6 mg via INTRAVENOUS
  Filled 2022-08-01: qty 1

## 2022-08-01 MED ORDER — PREDNISONE 10 MG PO TABS: ORAL_TABLET | ORAL | 0 refills | Status: AC

## 2022-08-01 NOTE — ED Provider Notes (Signed)
[place] EMERGENCY DEPARTMENT AT [facility] Provider Note   CSN: [id number] Arrival date & time: 08/01/22  1001     History  Chief Complaint  Patient presents with   Lost Voice    Lump to chest & back of neck    Kristen Woods is a 57 y.o. female who presents to the ED with voice hoarseness that began approximately 3 months ago.  Patient reports has been seen at the ED several times, as well as by ENT, who she reports that her endoscopy, and was told that "there is nothing wrong with my vocal cords," but noted on CT imaging in the past of prominent tonsils and likely reactive left-sided cervical lymphadenopathy.  She returns to the ED today complaining of worsening pain that she is noted on the left side of her neck into her left upper chest as well as over her left shoulder and to her back.   She denies any pain or difficulty with swallowing.  She denies any headache, blurred vision, loss of sensation in the face or extremities  HPI     Home Medications Prior to Admission medications   Medication Sig Start Date End Date Taking? Authorizing Provider  cyclobenzaprine (FLEXERIL) 10 MG tablet Take 1 tablet (10 mg total) by mouth 2 (two) times daily as needed for up to 14 doses for muscle spasms. 08/01/22  Yes Otis Portal, Kermit Balo, MD  predniSONE (DELTASONE) 10 MG tablet Take 4 tablets (40 mg total) by mouth daily with breakfast for 4 days, THEN 2 tablets (20 mg total) daily with breakfast for 3 days. 08/02/22 08/09/22 Yes Angelos Wasco, Kermit Balo, MD  albuterol (VENTOLIN HFA) 108 (90 Base) MCG/ACT inhaler Inhale 2 puffs into the lungs every 6 (six) hours as needed for wheezing or shortness of breath. 05/21/22   Ellsworth Lennox, PA-C  azithromycin (ZITHROMAX Z-PAK) 250 MG tablet 2 tablets initially and then 1 tablet daily until completed. 05/21/22   Ellsworth Lennox, PA-C  bumetanide (BUMEX) 1 MG tablet Take 1 tablet by mouth daily. 01/23/20   [provider]   dextromethorphan-guaiFENesin (MUCINEX DM) 30-600 MG 12hr tablet Take 1 tablet by mouth 2 (two) times daily. 05/21/22   Ellsworth Lennox, PA-C  diclofenac Sodium (VOLTAREN) 1 % GEL Apply 2 g topically 4 (four) times daily as needed. 03/20/22   Long, Arlyss Repress, MD  diltiazem (CARDIZEM LA) 180 MG 24 hr tablet Take 1 tablet (180 mg total) by mouth daily. 07/18/22   Georganna Skeans, MD  fluticasone (FLOVENT HFA) 110 MCG/ACT inhaler Inhale into the lungs. 09/01/19   [provider]  gabapentin (NEURONTIN) 300 MG capsule Take 1 capsule (300 mg total) by mouth 3 (three) times daily. 03/19/22   Georganna Skeans, MD  ibuprofen (ADVIL) 800 MG tablet Take 800 mg by mouth 3 (three) times daily. 04/03/22   [provider]  lisinopril-hydrochlorothiazide (ZESTORETIC) 20-25 MG tablet Take 1 tablet by mouth daily. 03/19/22   Georganna Skeans, MD  losartan-hydrochlorothiazide [facility]) 50-12.5 MG tablet Take 1 tablet by mouth daily. 09/01/19   [provider]  meloxicam (MOBIC) 15 MG tablet Take by mouth. 10/22/19   [provider]  methocarbamol (ROBAXIN) 500 MG tablet Take 1 tablet (500 mg total) by mouth every 8 (eight) hours as needed for muscle spasms. 03/20/22   Long, Arlyss Repress, MD  montelukast (SINGULAIR) 10 MG tablet Take 1 tablet (10 mg total) by mouth at bedtime. 03/19/22   Georganna Skeans, MD  omeprazole (PRILOSEC) 40 MG capsule Take  1 capsule (40 mg total) by mouth daily. 06/30/22   Georganna Skeans, MD  potassium chloride (KLOR-CON) 10 MEQ tablet Take 1 tablet by mouth daily. 07/19/19   [provider]  promethazine-dextromethorphan (PROMETHAZINE-DM) 6.25-15 MG/5ML syrup Take 5 mLs by mouth 4 (four) times daily as needed for cough. 05/23/22   Cecil Cobbs, PA-C  Spacer/Aero-Holding Deretha Emory DEVI 1 Units by Does not apply route in the morning, at noon, and at bedtime. 05/21/22   Ellsworth Lennox, PA-C  tolterodine (DETROL LA) 4 MG 24 hr capsule Take by mouth. 09/01/19   [provider]      Allergies    Patient has no known allergies.    Review of Systems   Review of Systems  Physical Exam Updated Vital Signs BP (!) 158/111 (BP Location: Left Wrist)   Pulse 83   Temp 98 F (36.7 C) (Oral)   Resp 20   LMP 01/04/2015   SpO2 100%  Physical Exam Constitutional:      General: She is not in acute distress.    Appearance: She is obese.     Comments: Voice is hoarse, whispers  HENT:     Head: Normocephalic and atraumatic.     Comments: Some peritonsillar prominence without significant swelling or uvular deviation. Eyes:     Conjunctiva/sclera: Conjunctivae normal.     Pupils: Pupils are equal, round, and reactive to light.  Cardiovascular:     Rate and Rhythm: Normal rate and regular rhythm.  Pulmonary:     Effort: Pulmonary effort is normal. No respiratory distress.  Abdominal:     General: There is no distension.     Tenderness: There is no abdominal tenderness.  Musculoskeletal:     Comments: Diffuse tenderness of the left upper chest wall as well as left suprascapular region and left paracervical region of her back, trigger point tenderness  Skin:    General: Skin is warm and dry.  Neurological:     General: No focal deficit present.     Mental Status: She is alert. Mental status is at baseline.  Psychiatric:        Mood and Affect: Mood normal.        Behavior: Behavior normal.     ED Results / Procedures / Treatments   Labs (all labs ordered are listed, but only abnormal results are displayed) Labs Reviewed  BASIC METABOLIC PANEL - Abnormal; Notable for the following components:      Result Value   Creatinine, Ser 1.07 (*)    All other components within normal limits  CBC - Abnormal; Notable for the following components:   Platelets 401 (*)    All other components within normal limits    EKG None  Radiology CT Chest W Contrast  Result Date: 08/01/2022 CLINICAL DATA:  Recent CT neck with palatine and lingual tonsil  prominence, now with dyspnea associated with palpable lump in the chest and back of neck EXAM: CT CHEST WITH CONTRAST TECHNIQUE: Multidetector CT imaging of the chest was performed during intravenous contrast administration. RADIATION DOSE REDUCTION: This exam was performed according to the departmental dose-optimization program which includes automated exposure control, adjustment of the mA and/or kV according to patient size and/or use of iterative reconstruction technique. CONTRAST:  50mL OMNIPAQUE IOHEXOL 350 MG/ML SOLN COMPARISON:  Chest radiograph dated 05/23/2022 FINDINGS: Cardiovascular: Normal heart size. No significant pericardial fluid/thickening. Great vessels are normal in course and caliber. Anatomic variant common origin of the brachiocephalic and common carotid arteries.  No central pulmonary emboli. Aortic atherosclerosis. Mediastinum/Nodes: Imaged thyroid gland without nodules meeting criteria for imaging follow-up by size. Normal esophagus. No pathologically enlarged axillary, supraclavicular, mediastinal, or hilar lymph nodes. Lungs/Pleura: The central airways are patent. No focal consolidation. Calcified right lower lobe granuloma (5:121). No pneumothorax. No pleural effusion. Upper abdomen: Simple cyst in the anterior left interpolar kidney. No specific follow-up imaging recommended. Musculoskeletal: No acute or abnormal lytic or blastic osseous lesions. IMPRESSION: 1. No acute cardiopulmonary process. 2. No CT correlate for the patient's reported palpable lump in the chest. 3.  Aortic Atherosclerosis (ICD10-I70.0). Electronically Signed   By: Agustin Cree M.D.   On: 08/01/2022 14:13    Procedures Procedures    Medications Ordered in ED Medications  dexamethasone (DECADRON) injection 6 mg (6 mg Intravenous Given 08/01/22 1129)  ketorolac (TORADOL) 30 MG/ML injection 30 mg (30 mg Intravenous Given 08/01/22 1129)  iohexol (OMNIPAQUE) 350 MG/ML injection 50 mL (50 mLs Intravenous Contrast  Given 08/01/22 1349)    ED Course/ Medical Decision Making/ A&P                             Medical Decision Making Amount and/or Complexity of Data Reviewed Labs: ordered. Radiology: ordered.  Risk Prescription drug management.   This patient presents to the ED with concern for left sided neck pain, chest wall pain, left upper back pain. This involves an extensive number of treatment options, and is a complaint that carries with it a high risk of complications and morbidity.  The differential diagnosis includes lymphadenopathy vs muscular strain vs fibromyalgia/other autoimmune presentation vs other  I ordered and personally interpreted labs.  The pertinent results include:  no emergent findings  I ordered imaging studies including CT chest I independently visualized and interpreted imaging which showed no acute emergent findings I agree with the radiologist interpretation   I ordered medication including steroids and toradol for suspected inflammatory muscle or nerve pain  I have reviewed the patients home medicines and have made adjustments as needed  Test Considered: no indication for emergent repeat CT imaging or vascular imaging of the neck or head - low suspicion for vascular dissection or abscess/infection  This presentation is also not consistent with brainstem stroke or lesion. The patient has no other localizing stroke lesions, including dysphagia, to suggest posterior infarct, and there was no report of vocal cord paralysis visualized on ENT larygnoscopy, per care everywhere records reviewed from [facility] 06/06/22 with Dr Aleene Davidson, "normal vocal cord mobility without nodule, mass, polyp or tumor."   After the interventions noted above, I reevaluated the patient and found that they have: improved   Dispostion:  After consideration of the diagnostic results and the patients response to treatment, I feel that the patent would benefit from outpatient ongoing ENT follow  up.         Final Clinical Impression(s) / ED Diagnoses Final diagnoses:  Hoarseness  Myalgia    Rx / DC Orders ED Discharge Orders          Ordered    predniSONE (DELTASONE) 10 MG tablet  Q breakfast        08/01/22 1448    cyclobenzaprine (FLEXERIL) 10 MG tablet  2 times daily PRN        08/01/22 1449              Terald Sleeper, MD 08/01/22 [phone]

## 2022-08-01 NOTE — ED Triage Notes (Signed)
Pt came in via POV d/t loss of her voice since 05/21/22 (per pt). States she was seen back then & a CT scan was done showing swelling to the Lt side of her throat & swollen tonsils. Pt endorses SOB & difficulty breathing if she talks too much & since she initially lost her voice she also has found a lump on her chest & back of neck. A/Ox4, 8/10 generalized pain while in triage. Denies fevers.

## 2022-08-12 ENCOUNTER — Telehealth: Payer: Self-pay

## 2022-08-12 NOTE — Telephone Encounter (Signed)
Lvm for pt to cb to see if willing to see megan tomorrow morning for her LBP

## 2022-08-13 ENCOUNTER — Encounter: Payer: Self-pay | Admitting: Physician Assistant

## 2022-08-13 ENCOUNTER — Ambulatory Visit (INDEPENDENT_AMBULATORY_CARE_PROVIDER_SITE_OTHER): Payer: Commercial Managed Care - HMO | Admitting: Physician Assistant

## 2022-08-13 ENCOUNTER — Other Ambulatory Visit (INDEPENDENT_AMBULATORY_CARE_PROVIDER_SITE_OTHER): Payer: Commercial Managed Care - HMO

## 2022-08-13 DIAGNOSIS — M4316 Spondylolisthesis, lumbar region: Secondary | ICD-10-CM

## 2022-08-13 MED ORDER — CYCLOBENZAPRINE HCL 10 MG PO TABS: 10.0000 mg | ORAL_TABLET | Freq: Two times a day (BID) | ORAL | 0 refills | Status: AC | PRN

## 2022-08-13 MED ORDER — ACETAMINOPHEN-CODEINE 300-30 MG PO TABS: 1.0000 | ORAL_TABLET | Freq: Four times a day (QID) | ORAL | 0 refills | Status: AC | PRN

## 2022-08-13 NOTE — Progress Notes (Signed)
Office Visit Note   Patient: Kristen Woods           Date of Birth: [birth year]           MRN: [id number] Visit Date: 08/13/2022              Requested by: Kristen Skeans, MD [id number] [place],  [place] [address] PCP: Kristen Skeans, MD   Assessment & Plan: Visit Diagnoses:  1. Spondylolisthesis, lumbar region     Plan: Will have her undergo an MRI of her lumbar spine to rule out HNP as a source of her radicular symptoms on the left.  Have her follow-up with Dr. Christell Woods after this to go over the study and discuss further treatment.  In regards to her hoarseness and lymph adenopathy recommend she follow-up with ENT as their last note stated that she would need follow-up consult with one of the ENT surgeons.  Questions were encouraged and answered  Follow-Up Instructions: No follow-ups on file.   Orders:  Orders Placed This Encounter  Procedures   XR Lumbar Spine 2-3 Views   Meds ordered this encounter  Medications   acetaminophen-codeine (TYLENOL #3) 300-30 MG tablet    Sig: Take 1 tablet by mouth every 6 (six) hours as needed for moderate pain.    Dispense:  20 tablet    Refill:  0   cyclobenzaprine (FLEXERIL) 10 MG tablet    Sig: Take 1 tablet (10 mg total) by mouth 2 (two) times daily as needed for muscle spasms.    Dispense:  40 tablet    Refill:  0      Procedures: No procedures performed   Clinical Data: No additional findings.   Subjective: Chief Complaint  Patient presents with   Lower Back - Pain    HPI Kristen Woods comes in today due to flareup of her low back.  She states it started last Friday after she was washing her hair and stood up straight.  She states her back locked up.  She denies any waking pain, saddle anesthesia like symptoms, unintentional weight loss, fevers, or chills.  However she does note that she has had's hoarseness for 3-1/2 months she has been seen in the ER and ENT.  She also has been told that she had enlarged  lymph nodes about the left side of her cervical spine.  She has had prior back pain and actually saw Dr. Christell Woods here in our office for the back pain.  She states her back was doing overall pretty well until this past Friday.  She is having radicular symptoms down the left side to the knee and sciatic distribution.  She was recently placed on a prednisone Dosepak from the ER and states that she has 1 more day left to take but this is not helping with her back pain.  She has been taking ibuprofen's not sure that this is helping.  She has tried gabapentin in the past for back and meloxicam with no relief.  She ranks her pain to be 10 out of 10 pain left-sided low back pain with radicular symptoms down the left leg to the knee. Review of Systems See HPI otherwise negative  Objective: Vital Signs: LMP 01/04/2015   Physical Exam Constitutional:      Appearance: She is not ill-appearing or diaphoretic.  Pulmonary:     Effort: Pulmonary effort is normal.  Neurological:     Mental Status: She is alert and oriented to person, place, and  time.  Psychiatric:        Mood and Affect: Mood normal.     Ortho Exam Lower extremities 5 out of 5 strength throughout the lower extremities against resistance.  Positive straight leg raise on the left negative on the right.  Walks with a antalgic gait.  She is able to get up and down from seated position. Specialty Comments:  No specialty comments available.  Imaging: XR Lumbar Spine 2-3 Views  Result Date: 08/13/2022 Lumbar spine AP lateral flexion-extension: No acute fractures.  There is a new grade 1 L4 on L5 anterior spondylolisthesis.  With flexion /extension there is no translation.  Slight scoliosis present.  Lower lumbar facet joint degenerative changes noted.    PMFS History: Patient Active Problem List   Diagnosis Date Noted   Dysphonia 06/06/2022   Hoarseness 06/06/2022   Eating disorder 05/22/2022   Pre-diabetes 02/06/2022   Other fatigue  02/04/2022   SOB (shortness of breath) on exertion 02/04/2022   Essential hypertension 02/04/2022   Health care maintenance 02/04/2022   Other hyperlipidemia 02/04/2022   Elevated glucose 02/04/2022   Vitamin D deficiency 02/04/2022   Morbid obesity 02/04/2022   Unilateral primary osteoarthritis, right knee 12/16/2021   History of total left knee replacement 12/16/2021   Obstructive sleep apnea syndrome 03/05/2021   Stress incontinence of urine 03/05/2021   Mixed hyperlipidemia 09/01/2019   Obesity, Class III, BMI 40-49.9 (morbid obesity) 09/01/2019   Chronic anemia 06/13/2019   DDD (degenerative disc disease), cervical 06/13/2019   DDD (degenerative disc disease), lumbosacral 06/13/2019   Peripheral edema 06/13/2019   Gastroesophageal reflux disease 06/13/2019   History of colon polyps 06/13/2019   Mild intermittent asthma 06/13/2019   Lower back pain 01/12/2015   Neck pain 10/05/2014   Left arm pain 10/05/2014   Dysesthesia 10/05/2014   Shoulder pain, left 10/05/2014   Left knee pain 08/02/2012   Right wrist pain 02/09/2012   Left leg pain 11/17/2011   Past Medical History:  Diagnosis Date   Back pain    Chronic fatigue syndrome    Depression    High cholesterol    Hypertension    Joint pain    Joint pain    Leg pain    Liver disease    Nerves    Sleep apnea    Swelling of lower extremity    Vision abnormalities     Family History  Problem Relation Age of Onset   Hypertension Mother    Heart disease Mother    Diabetes Father    Hyperlipidemia Other    Heart attack Neg Hx    Sudden death Neg Hx     Past Surgical History:  Procedure Laterality Date   BREAST SURGERY     INCONTINENCE SURGERY     KNEE SURGERY     Social History   Occupational History   Not on file  Tobacco Use   Smoking status: Never   Smokeless tobacco: Not on file  Substance and Sexual Activity   Alcohol use: No   Drug use: No   Sexual activity: Not on file

## 2022-08-14 ENCOUNTER — Other Ambulatory Visit: Payer: Self-pay

## 2022-08-14 ENCOUNTER — Emergency Department (HOSPITAL_COMMUNITY): Payer: Commercial Managed Care - HMO

## 2022-08-14 ENCOUNTER — Encounter (HOSPITAL_COMMUNITY): Payer: Self-pay

## 2022-08-14 ENCOUNTER — Telehealth: Payer: Self-pay | Admitting: Family Medicine

## 2022-08-14 ENCOUNTER — Emergency Department (HOSPITAL_COMMUNITY)
Admission: EM | Admit: 2022-08-14 | Discharge: 2022-08-14 | Disposition: A | Discharge status: Home / Self Care | Payer: Commercial Managed Care - HMO | Attending: Emergency Medicine | Admitting: Emergency Medicine

## 2022-08-14 DIAGNOSIS — M4316 Spondylolisthesis, lumbar region: Secondary | ICD-10-CM

## 2022-08-14 DIAGNOSIS — M542 Cervicalgia: Secondary | ICD-10-CM | POA: Insufficient documentation

## 2022-08-14 DIAGNOSIS — R079 Chest pain, unspecified: Secondary | ICD-10-CM | POA: Diagnosis not present

## 2022-08-14 DIAGNOSIS — M25512 Pain in left shoulder: Secondary | ICD-10-CM | POA: Diagnosis not present

## 2022-08-14 LAB — CBC WITH DIFFERENTIAL/PLATELET
Abs Immature Granulocytes: 0.02 10*3/uL (ref 0.00–0.07)
Basophils Absolute: 0 10*3/uL (ref 0.0–0.1)
Basophils Relative: 0 %
Eosinophils Absolute: 0.3 10*3/uL (ref 0.0–0.5)
Eosinophils Relative: 5 %
HCT: 41 % (ref 36.0–46.0)
Hemoglobin: 13 g/dL (ref 12.0–15.0)
Immature Granulocytes: 0 %
Lymphocytes Relative: 24 %
Lymphs Abs: 1.5 10*3/uL (ref 0.7–4.0)
MCH: 28.5 pg (ref 26.0–34.0)
MCHC: 31.7 g/dL (ref 30.0–36.0)
MCV: 89.9 fL (ref 80.0–100.0)
Monocytes Absolute: 0.6 10*3/uL (ref 0.1–1.0)
Monocytes Relative: 9 %
Neutro Abs: 3.8 10*3/uL (ref 1.7–7.7)
Neutrophils Relative %: 62 %
Platelets: 408 10*3/uL — ABNORMAL HIGH (ref 150–400)
RBC: 4.56 MIL/uL (ref 3.87–5.11)
RDW: 13.8 % (ref 11.5–15.5)
WBC: 6.3 10*3/uL (ref 4.0–10.5)
nRBC: 0 % (ref 0.0–0.2)

## 2022-08-14 LAB — COMPREHENSIVE METABOLIC PANEL
ALT: 16 U/L (ref 0–44)
AST: 17 U/L (ref 15–41)
Albumin: 3.5 g/dL (ref 3.5–5.0)
Alkaline Phosphatase: 90 U/L (ref 38–126)
Anion gap: 10 (ref 5–15)
BUN: 12 mg/dL (ref 6–20)
CO2: 26 mmol/L (ref 22–32)
Calcium: 9.1 mg/dL (ref 8.9–10.3)
Chloride: 101 mmol/L (ref 98–111)
Creatinine, Ser: 1.22 mg/dL — ABNORMAL HIGH (ref 0.44–1.00)
GFR, Estimated: 52 mL/min — ABNORMAL LOW (ref >60)
Glucose, Bld: 116 mg/dL — ABNORMAL HIGH (ref 70–99)
Potassium: 3.6 mmol/L (ref 3.5–5.1)
Sodium: 137 mmol/L (ref 135–145)
Total Bilirubin: 0.8 mg/dL (ref 0.3–1.2)
Total Protein: 7.1 g/dL (ref 6.5–8.1)

## 2022-08-14 LAB — SEDIMENTATION RATE: Sed Rate: 41 mm/hr — ABNORMAL HIGH (ref 0–22)

## 2022-08-14 NOTE — ED Provider Notes (Signed)
[place] EMERGENCY DEPARTMENT AT [facility] Provider Note   CSN: [id number] Arrival date & time: 08/14/22  0725     History  Chief Complaint  Patient presents with   Hoarse   Neck Pain    Kristen Woods is a 57 y.o. female.  HPI Patient with multiple medical issues including degenerative disc disease, presents with concern for ongoing hoarseness, lymphadenopathy, back pain, and left shoulder pain.  Patient has been seen, evaluated, here, Ortho, primary care, ENT multiple times since January of this year.  She recently completed a course of antibiotics, notes that that did not change her symptoms appreciably.  She presents today due to ongoing hoarseness, concern of losing her job tomorrow as well as with concern for left shoulder pain radiating to her neck that occurred earlier in the day.  No new fever, syncope, new pain.    Home Medications Prior to Admission medications   Medication Sig Start Date End Date Taking? Authorizing Provider  cyclobenzaprine (FLEXERIL) 10 MG tablet Take 1 tablet (10 mg total) by mouth 2 (two) times daily as needed for muscle spasms. 08/13/22  Yes Kirtland Bouchard, PA-C  diltiazem (CARDIZEM LA) 180 MG 24 hr tablet Take 1 tablet (180 mg total) by mouth daily. 07/18/22  Yes Georganna Skeans, MD  ibuprofen (ADVIL) 800 MG tablet Take 800 mg by mouth 3 (three) times daily. 04/03/22  Yes [provider]  lisinopril-hydrochlorothiazide (ZESTORETIC) 20-25 MG tablet Take 1 tablet by mouth daily. 03/19/22  Yes Georganna Skeans, MD  omeprazole (PRILOSEC) 40 MG capsule Take 1 capsule (40 mg total) by mouth daily. 06/30/22  Yes Georganna Skeans, MD  acetaminophen-codeine (TYLENOL #3) 300-30 MG tablet Take 1 tablet by mouth every 6 (six) hours as needed for moderate pain. Patient not taking: Reported on 08/14/2022 08/13/22   Kirtland Bouchard, PA-C  albuterol (VENTOLIN HFA) 108 (90 Base) MCG/ACT inhaler Inhale 2 puffs into the lungs every 6 (six) hours as  needed for wheezing or shortness of breath. Patient not taking: Reported on 08/14/2022 05/21/22   Ellsworth Lennox, PA-C  azithromycin (ZITHROMAX Z-PAK) 250 MG tablet 2 tablets initially and then 1 tablet daily until completed. Patient not taking: Reported on 08/14/2022 05/21/22   Ellsworth Lennox, PA-C  dextromethorphan-guaiFENesin [facility] DM) 30-600 MG 12hr tablet Take 1 tablet by mouth 2 (two) times daily. Patient not taking: Reported on 08/14/2022 05/21/22   Ellsworth Lennox, PA-C  diclofenac Sodium (VOLTAREN) 1 % GEL Apply 2 g topically 4 (four) times daily as needed. Patient not taking: Reported on 08/14/2022 03/20/22   Maia Plan, MD  fluticasone [facility] HFA) 110 MCG/ACT inhaler Inhale into the lungs. Patient not taking: Reported on 08/14/2022 09/01/19   [provider]  montelukast (SINGULAIR) 10 MG tablet Take 1 tablet (10 mg total) by mouth at bedtime. Patient not taking: Reported on 08/14/2022 03/19/22   Georganna Skeans, MD  promethazine-dextromethorphan (PROMETHAZINE-DM) 6.25-15 MG/5ML syrup Take 5 mLs by mouth 4 (four) times daily as needed for cough. Patient not taking: Reported on 08/14/2022 05/23/22   Cecil Cobbs, PA-C  Spacer/Aero-Holding Chambers DEVI 1 Units by Does not apply route in the morning, at noon, and at bedtime. Patient not taking: Reported on 08/14/2022 05/21/22   Ellsworth Lennox, PA-C      Allergies    Patient has no known allergies.    Review of Systems   Review of Systems  All other systems reviewed and are negative.   Physical Exam Updated Vital Signs BP Marland Kitchen)  138/109 (BP Location: Right Arm)   Pulse 97   Temp 99.2 F (37.3 C) (Oral)   Resp 20   Ht  (1.676 m)   Wt 131.5 kg   LMP 01/04/2015   SpO2 97%   BMI 46.81 kg/m  Physical Exam Vitals and nursing note reviewed.  Constitutional:      General: She is not in acute distress.    Appearance: She is well-developed. She is obese. She is not ill-appearing or diaphoretic.  HENT:     Head:  Normocephalic and atraumatic.     Mouth/Throat:   Eyes:     Conjunctiva/sclera: Conjunctivae normal.  Neck:   Cardiovascular:     Rate and Rhythm: Normal rate and regular rhythm.  Pulmonary:     Effort: Pulmonary effort is normal. No respiratory distress.     Breath sounds: Normal breath sounds. No stridor.  Abdominal:     General: There is no distension.  Skin:    General: Skin is warm and dry.  Neurological:     General: No focal deficit present.     Mental Status: She is alert and oriented to person, place, and time.     Cranial Nerves: No cranial nerve deficit.  Psychiatric:        Mood and Affect: Mood normal.     ED Results / Procedures / Treatments   Labs (all labs ordered are listed, but only abnormal results are displayed) Labs Reviewed  COMPREHENSIVE METABOLIC PANEL - Abnormal; Notable for the following components:      Result Value   Glucose, Bld 116 (*)    Creatinine, Ser 1.22 (*)    GFR, Estimated 52 (*)    All other components within normal limits  CBC WITH DIFFERENTIAL/PLATELET - Abnormal; Notable for the following components:   Platelets 408 (*)    All other components within normal limits  SEDIMENTATION RATE - Abnormal; Notable for the following components:   Sed Rate 41 (*)    All other components within normal limits    EKG None  Radiology DG Chest 2 View  Result Date: 08/14/2022 CLINICAL DATA:  Hoarseness with left shoulder pain. EXAM: CHEST - 2 VIEW COMPARISON:  05/23/2022 FINDINGS: Low lung volumes are stable from the previous examination. No evidence for airspace disease or pulmonary edema. Heart size is normal and stable. Trachea is midline. Negative for a pneumothorax. No pleural effusions. Bony thorax is intact. IMPRESSION: No active cardiopulmonary disease. Electronically Signed   By: Richarda Overlie M.D.   On: 08/14/2022 09:26   XR Lumbar Spine 2-3 Views  Result Date: 08/13/2022 Lumbar spine AP lateral flexion-extension: No acute fractures.   There is a new grade 1 L4 on L5 anterior spondylolisthesis.  With flexion /extension there is no translation.  Slight scoliosis present.  Lower lumbar facet joint degenerative changes noted.   Procedures Procedures    Medications Ordered in ED Medications - No data to display  ED Course/ Medical Decision Making/ A&P                             Medical Decision Making Obese adult female presents with concern for ongoing hoarseness, adenopathy and an episode of left shoulder and neck pain earlier today.  Patient's description of symptoms have been present since the beginning of the year is somewhat reassuring in terms of suspicion for acute new pathology.  I reviewed her chart including ENT visits, Ortho visits over  the past 2 months, imaging as well, with notation of adenopathy.  Here she is awake, alert, mildly hypertensive but otherwise in no distress, speaking clearly with a patent airway. Evaluation including labs, x-ray for consideration of mass, inflammatory condition considered. Patient declined initial interventions otherwise.   Amount and/or Complexity of Data Reviewed External Data Reviewed: notes.    Details: Included below Labs: ordered. Decision-making details documented in ED Course. Radiology: ordered and independent interpretation performed. Decision-making details documented in ED Course.  Risk OTC drugs. Prescription drug management. Decision regarding hospitalization.  there was no report of vocal cord paralysis visualized on ENT larygnoscopy, per care everywhere records reviewed from [facility] 06/06/22 with Dr Aleene Davidson, "normal vocal cord mobility without nodule, mass, polyp or tumor." 12:42 PM Patient in no distress, awake, alert, sitting upright, using his cellular telephone. No evidence for stress, respiratory compromise, I reviewed her labs, x-ray, and prior imaging as well as outpatient follow-up notes. No evidence for acute new phenomenon, bacteremia, sepsis,  airway compromise, patient will follow-up with her physician later today.        Final Clinical Impression(s) / ED Diagnoses Final diagnoses:  Neck pain    Rx / DC Orders ED Discharge Orders     None         Gerhard Munch, MD 08/14/22 1242

## 2022-08-14 NOTE — Discharge Instructions (Signed)
As discussed, today's evaluation has been generally reassuring.  Your x-ray, and blood tests did not demonstrate evidence for new phenomena.  Given your ongoing symptoms, please be sure to follow-up with your primary care physician and consider referral to a rheumatologist as well as continue following up with ENT. Return here for concerning changes in your condition.

## 2022-08-14 NOTE — Telephone Encounter (Signed)
Pt came in stated she went to ER three times since last OV ,they let her know that Primary care needs to send in referral to rheumatologist as soon as possible.

## 2022-08-14 NOTE — ED Notes (Signed)
Pt stated that the last time she was seen here she was suspose to receive paper work for an referral that she never received , wantyed to know if she can get that same referral

## 2022-08-14 NOTE — ED Triage Notes (Signed)
Pt. Stated, Kristen Woods been hoarse since jan. 31 and I've been here 3 times. Nobody has told me anything. Ive had my back to lock for a few seconds went to my orth and he said it did slip. Its in my lymph nodes all around. This is effecting me everywhere.

## 2022-08-14 NOTE — ED Notes (Signed)
Patient transported to X-ray 

## 2022-08-15 ENCOUNTER — Ambulatory Visit (INDEPENDENT_AMBULATORY_CARE_PROVIDER_SITE_OTHER): Payer: Commercial Managed Care - HMO | Admitting: Psychology

## 2022-08-15 DIAGNOSIS — F3289 Other specified depressive episodes: Secondary | ICD-10-CM | POA: Diagnosis not present

## 2022-08-15 DIAGNOSIS — F418 Other specified anxiety disorders: Secondary | ICD-10-CM

## 2022-08-15 NOTE — Progress Notes (Signed)
[facility] Counselor/Therapist Progress Note  Patient ID: Kristen Woods, MRN: [id number]    Date: 08/15/22  Time Spent: 10:33  am - 11:19 am : 46 Minutes  Treatment Type: Individual Therapy.  Reported Symptoms: depression and anxiety  Mental Status Exam: Appearance:  Casual     Behavior: Appropriate  Motor: Normal  Speech/Language:  Clear and Coherent  Affect: Flat  Mood: dysthymic  Thought process: normal  Thought content:   WNL  Sensory/Perceptual disturbances:   WNL  Orientation: oriented to person, place, time/date, and situation  Attention: Good  Concentration: Good  Memory: WNL  Fund of knowledge:  Good  Insight:   Good  Judgment:  Good  Impulse Control: Good   Risk Assessment: Danger to Self:  No Self-injurious Behavior: No Danger to Others: No Duty to Warn:no Physical Aggression / Violence:No  Access to Firearms a concern: No  Gang Involvement:No   Subjective:   Kristen Woods participated in the session, in person in the office with the therapist, and consented to treatment Lore reviewed the events of the past week. She noted continued frustration regarding her health and noted difficulty with work due to receiving accommodations that are counter to her health issues. We explored this during the session and td the feeling she experiences as a result. She noted her frustration regarding her health and a lack of diagnoses regarding her various health issues. She noted this uncertainty regarding her health being upsetting. She noted tried to make sense of her experience. She noted her history of moving into her sister's home, during high school, to aid in caring for her sister and her children. She noted having to juggle the household and her own highschool education, as well. She noted her numerous roles that focused on others and a loss of sense of self, as a result. She noted wishing she would live in a different place "without worrying" about  finances or housing. She noted a need to feel safe. She defined safe as things being "concrete", having autonomy, , having stability, and defining a sense of self. Therapist encouraged Kristen Woods to identify previous, current, and possible activities she has or would spend time on to begin work on defining her interest and, in turn, sense of self. Therapist provided a balance wheel form, via email, to complete as she identifies what she would like her life to look like. Therapist praised Kristen Woods for her effort and energy during the session and provided supportive therapy.   Interventions: CBT  Diagnosis:   Other depression  Other specified anxiety disorders  Psychiatric Treatment: No , but will be referred to psychiatric provider.   Treatment Plan:  Client Abilities/Strengths Kristen Woods is intelligent, well spoken, and expressive.   Support System: Family and friends.   Client Treatment Preferences Outpatient therapy.   Client Statement of Needs Kristen Woods would like to reduce castrophizing, improve self-talk, build and maintain positive habits, process past events and life stressors, processing the loss of mother and family dynamics after passing, processing the care-taker role of mom and lack of family support, process abandonment by family. Additional goals include bolstering coping skills, creating a healthful daily routine, engaging in consistent self-care, improve mindfulness, reduce rumination, and build a sense of self.   Treatment Level Weekly  Symptoms  Anxiety: feeling nervous, difficulty managing worry, worrying about different things, trouble relaxing.    (Status: maintained) Depression:  loss of interest, feeling down, poor sleep, lethargy, fluctuating appetite, feeling bad about self, difficulty concentrating, emotional eating.   (  Status: maintained)  Goals:   Kristen Woods experiences symptoms of depression and anxiety.    Target Date: 07/29/23 Frequency: Weekly  Progress: 0  Modality: individual    Therapist will provide referrals for additional resources as appropriate.  Therapist will provide psycho-education regarding Kristen Woods's diagnosis and corresponding treatment approaches and interventions. Licensed Clinical Social Worker, [place], LCSW will support the patient's ability to achieve the goals identified. will employ CBT, BA, Problem-solving, Solution Focused, Mindfulness,  coping skills, & other evidenced-based practices will be used to promote progress towards healthy functioning to help manage decrease symptoms associated with her diagnosis.   Reduce overall level, frequency, and intensity of the feelings of depression, anxiety and panic evidenced by decreased overall symptoms from 6 to 7 days/week to 0 to 1 days/week per client report for at least 3 consecutive months. Verbally express understanding of the relationship between feelings of depression, anxiety and their impact on thinking patterns and behaviors. Verbalize an understanding of the role that distorted thinking plays in creating fears, excessive worry, and ruminations.  Kristen Woods participated in the creation of the treatment plan)    Delight Ovens, LCSW

## 2022-08-18 ENCOUNTER — Ambulatory Visit (INDEPENDENT_AMBULATORY_CARE_PROVIDER_SITE_OTHER): Payer: Commercial Managed Care - HMO | Admitting: Family Medicine

## 2022-08-18 VITALS — BP 137/93 | HR 102 | Temp 99.6°F | Resp 18 | Wt 304.8 lb

## 2022-08-18 DIAGNOSIS — I10 Essential (primary) hypertension: Secondary | ICD-10-CM

## 2022-08-18 DIAGNOSIS — R490 Dysphonia: Secondary | ICD-10-CM

## 2022-08-18 DIAGNOSIS — J209 Acute bronchitis, unspecified: Secondary | ICD-10-CM | POA: Diagnosis not present

## 2022-08-18 DIAGNOSIS — M5137 Other intervertebral disc degeneration, lumbosacral region: Secondary | ICD-10-CM | POA: Diagnosis not present

## 2022-08-18 DIAGNOSIS — Z6841 Body Mass Index (BMI) 40.0 and over, adult: Secondary | ICD-10-CM

## 2022-08-18 MED ORDER — AZITHROMYCIN 250 MG PO TABS: ORAL_TABLET | ORAL | 0 refills | Status: DC

## 2022-08-18 NOTE — Progress Notes (Signed)
Established Patient Office Visit  Subjective    Patient ID: Kristen Woods, female    DOB: Aug 20, 1965  Age: 57 y.o. MRN: [id number]  CC:  Chief Complaint  Patient presents with   Follow-up    HPI Kristen Woods presents for follow up of chronic med issues. Patient reports that she had to resign form her job 2/2 persistent hoarseness. Patient denies fever/chills or viral sx.    Outpatient Encounter Medications as of 08/18/2022  Medication Sig   lisinopril-hydrochlorothiazide (ZESTORETIC) 20-25 MG tablet Take 1 tablet by mouth daily.   omeprazole (PRILOSEC) 40 MG capsule Take 1 capsule (40 mg total) by mouth daily.   acetaminophen-codeine (TYLENOL #3) 300-30 MG tablet Take 1 tablet by mouth every 6 (six) hours as needed for moderate pain. (Patient not taking: Reported on 08/14/2022)   albuterol (VENTOLIN HFA) 108 (90 Base) MCG/ACT inhaler Inhale 2 puffs into the lungs every 6 (six) hours as needed for wheezing or shortness of breath. (Patient not taking: Reported on 08/14/2022)   azithromycin (ZITHROMAX Z-PAK) 250 MG tablet 2 tablets initially and then 1 tablet daily until completed. (Patient not taking: Reported on 08/14/2022)   cyclobenzaprine (FLEXERIL) 10 MG tablet Take 1 tablet (10 mg total) by mouth 2 (two) times daily as needed for muscle spasms. (Patient not taking: Reported on 08/18/2022)   diclofenac Sodium (VOLTAREN) 1 % GEL Apply 2 g topically 4 (four) times daily as needed. (Patient not taking: Reported on 08/14/2022)   diltiazem (CARDIZEM LA) 180 MG 24 hr tablet Take 1 tablet (180 mg total) by mouth daily. (Patient not taking: Reported on 08/18/2022)   ibuprofen (ADVIL) 800 MG tablet Take 800 mg by mouth 3 (three) times daily. (Patient not taking: Reported on 08/18/2022)   promethazine-dextromethorphan (PROMETHAZINE-DM) 6.25-15 MG/5ML syrup Take 5 mLs by mouth 4 (four) times daily as needed for cough. (Patient not taking: Reported on 08/14/2022)   Spacer/Aero-Holding Chambers  DEVI 1 Units by Does not apply route in the morning, at noon, and at bedtime. (Patient not taking: Reported on 08/14/2022)   [DISCONTINUED] dextromethorphan-guaiFENesin (MUCINEX DM) 30-600 MG 12hr tablet Take 1 tablet by mouth 2 (two) times daily. (Patient not taking: Reported on 08/14/2022)   [DISCONTINUED] fluticasone (FLOVENT HFA) 110 MCG/ACT inhaler Inhale into the lungs. (Patient not taking: Reported on 08/14/2022)   [DISCONTINUED] montelukast (SINGULAIR) 10 MG tablet Take 1 tablet (10 mg total) by mouth at bedtime. (Patient not taking: Reported on 08/14/2022)   No facility-administered encounter medications on file as of 08/18/2022.    Past Medical History:  Diagnosis Date   Back pain    Chronic fatigue syndrome    Depression    High cholesterol    Hypertension    Joint pain    Joint pain    Leg pain    Liver disease    Nerves    Sleep apnea    Swelling of lower extremity    Vision abnormalities     Past Surgical History:  Procedure Laterality Date   BREAST SURGERY     INCONTINENCE SURGERY     KNEE SURGERY      Family History  Problem Relation Age of Onset   Hypertension Mother    Heart disease Mother    Diabetes Father    Hyperlipidemia Other    Heart attack Neg Hx    Sudden death Neg Hx     Social History   Socioeconomic History   Marital status: Single    Spouse name: Not on  file   Number of children: Not on file   Years of education: Not on file   Highest education level: Associate degree: occupational, Scientist, product/process development, or vocational program  Occupational History   Not on file  Tobacco Use   Smoking status: Never   Smokeless tobacco: Not on file  Substance and Sexual Activity   Alcohol use: No   Drug use: No   Sexual activity: Not on file  Other Topics Concern   Not on file  Social History Narrative   Not on file   Social Determinants of Health   Financial Resource Strain: Medium Risk (08/18/2022)   Overall Financial Resource Strain (CARDIA)     Difficulty of Paying Living Expenses: Somewhat hard  Food Insecurity: Food Insecurity Present (08/18/2022)   Hunger Vital Sign    Worried About Running Out of Food in the Last Year: Sometimes true    Ran Out of Food in the Last Year: Not on file  Transportation Needs: No Transportation Needs (08/18/2022)   PRAPARE - Administrator, Civil Service (Medical): No    Lack of Transportation (Non-Medical): No  Physical Activity: Insufficiently Active (08/18/2022)   Exercise Vital Sign    Days of Exercise per Week: 2 days    Minutes of Exercise per Session: 10 min  Stress: Stress Concern Present (08/18/2022)   [employer] of Occupational Health - Occupational Stress Questionnaire    Feeling of Stress : To some extent  Social Connections: Socially Isolated (08/18/2022)   Social Connection and Isolation Panel [NHANES]    Frequency of Communication with Friends and Family: Once a week    Frequency of Social Gatherings with Friends and Family: Never    Attends Religious Services: 1 to 4 times per year    Active Member of [employer] or Organizations: No    Attends Engineer, structural: Not on file    Marital Status: Never married  Intimate Partner Violence: Not on file    Review of Systems  Constitutional:  Negative for chills and fever.  HENT:  Negative for congestion and sore throat.   Respiratory:  Positive for cough, sputum production and wheezing.   Musculoskeletal:  Positive for back pain.  Psychiatric/Behavioral:  Negative for suicidal ideas. The patient is nervous/anxious.   All other systems reviewed and are negative.       Objective    BP (!) 137/93   Pulse (!) 102   Temp 99.6 F (37.6 C) (Oral)   Resp 18   Wt (!) 304 lb 12.8 oz (138.3 kg)   LMP 01/04/2015   SpO2 93%   BMI 49.20 kg/m   Physical Exam Vitals and nursing note reviewed.  Constitutional:      General: She is not in acute distress.    Appearance: She is obese.  HENT:     Mouth/Throat:      Mouth: Mucous membranes are moist.     Pharynx: No oropharyngeal exudate or posterior oropharyngeal erythema.  Cardiovascular:     Rate and Rhythm: Normal rate and regular rhythm.  Pulmonary:     Effort: Pulmonary effort is normal.     Breath sounds: Normal breath sounds.  Musculoskeletal:     Cervical back: Normal range of motion and neck supple.  Neurological:     General: No focal deficit present.     Mental Status: She is alert and oriented to person, place, and time.         Assessment & Plan:  1. Hoarseness, persistent Keep scheduled appt with ENT for further eval/mgt - patient  has resigned from her job (forced) 2/2 sx  2. Class 3 severe obesity due to excess calories with body mass index (BMI) of 45.0 to 49.9 in adult, unspecified whether serious comorbidity present [facility]) Patient to return to weight loss for further eval/mgt  3. Essential hypertension Slightly elevated readings. continue  4. DDD (degenerative disc disease), lumbosacral Patient working with consultants considering disability.  5. Acute bronchitis, unspecified organism Zithromax prescribed.    Return in about 3 months (around 11/17/2022).   Tommie Raymond, MD

## 2022-08-25 ENCOUNTER — Ambulatory Visit: Payer: Commercial Managed Care - HMO | Admitting: Nurse Practitioner

## 2022-08-25 ENCOUNTER — Encounter: Payer: Self-pay | Admitting: Nurse Practitioner

## 2022-08-25 VITALS — BP 138/95 | HR 85 | Temp 98.0°F | Ht 66.0 in | Wt 306.0 lb

## 2022-08-25 DIAGNOSIS — I10 Essential (primary) hypertension: Secondary | ICD-10-CM | POA: Diagnosis not present

## 2022-08-25 DIAGNOSIS — Z6841 Body Mass Index (BMI) 40.0 and over, adult: Secondary | ICD-10-CM | POA: Diagnosis not present

## 2022-08-25 NOTE — Progress Notes (Signed)
Office: [phone]  /  Fax: [phone]  WEIGHT SUMMARY AND BIOMETRICS  No data recorded Weight Gained Since Last Visit: 12lb   Vitals Temp: 98 F (36.7 C) BP: (!) 138/95 Pulse Rate: 85 SpO2: 95 %   Anthropometric Measurements Height: 5\' 6"  (1.676 m) Weight: (!) 306 lb (138.8 kg) BMI (Calculated): 49.41 Weight at Last Visit: 294lb Weight Gained Since Last Visit: 12lb Starting Weight: 300lb Total Weight Loss (lbs): 0 lb (0 kg)   Body Composition  Body Fat %: 56.5 % Fat Mass (lbs): 173 lbs Muscle Mass (lbs): 126.4 lbs Total Body Water (lbs): 99.8 lbs Visceral Fat Rating : 21   Other Clinical Data Fasting: No Labs: No Today's Visit #: 8 Starting Date: 02/04/22     HPI  Chief Complaint: OBESITY  Kristen Woods is here to discuss her progress with her obesity treatment plan. She is on the the Category 3 Plan and states she is following her eating plan approximately 50 % of the time. She states she is exercising 30 minutes 3 days per week.   Interval History:  Since last office visit she has gained 12 pounds.  She has been struggling with stress eating.  She is struggling with back pain and MRI back has been ordered.  She is seeing a therapist on a regular basis.  She has been working on stopping "all the bad stuff I have been eating".  She is eating high sugar/carb foods.  She is trying to limit and change snacking.  She is trying to eat less fried foods.  She is trying to be more active. She is still having problems with hoarseness.  Is going to see a speech therapist.  Has been to ED and seen Dr. Aleene Davidson and has a follow up appt scheduled.  Saw PCP last on 08/18/22.  Here today because she wants to get back on a meal plan.      Pharmacotherapy for weight loss: She is not currently taking medications  for medical weight loss.    Previous pharmacotherapy for medical weight loss:  [facility]    Bariatric surgery:  Patient has not had bariatric surgery.    Hypertension Hypertension needs improvement.  Medication(s): Lisinopril HCST 20-25mg .  Denies side effects.   Denies chest pain, palpitations and SOB.  BP Readings from Last 3 Encounters:  08/25/22 (!) 138/95  08/18/22 (!) 137/93  08/14/22 120/87   Lab Results  Component Value Date   CREATININE 1.22 (H) 08/14/2022   CREATININE 1.07 (H) 08/01/2022   CREATININE 0.98 07/15/2022     PHYSICAL EXAM:  Blood pressure (!) 138/95, pulse 85, temperature 98 F (36.7 C), height 5\' 6"  (1.676 m), weight (!) 306 lb (138.8 kg), last menstrual period 01/04/2015, SpO2 95 %. Body mass index is 49.39 kg/m.  General: She is overweight, cooperative, alert, well developed, and in no acute distress. PSYCH: Has normal mood, affect and thought process.   Extremities: No edema.  Neurologic: No gross sensory or motor deficits. No tremors or fasciculations noted.    DIAGNOSTIC DATA REVIEWED:  BMET    Component Value Date/Time   NA 137 08/14/2022 0840   NA 141 07/15/2022 1023   K 3.6 08/14/2022 0840   CL 101 08/14/2022 0840   CO2 26 08/14/2022 0840   GLUCOSE 116 (H) 08/14/2022 0840   BUN 12 08/14/2022 0840   BUN 14 07/15/2022 1023   CREATININE 1.22 (H) 08/14/2022 0840   CREATININE 1.04 (H) 09/25/2021 1315   CALCIUM 9.1 08/14/2022 0840  GFRNONAA 52 (L) 08/14/2022 0840   GFRAA 50 (L) 09/13/2015 1734   Lab Results  Component Value Date   HGBA1C 6.0 (H) 06/12/2022   HGBA1C 6.0 (H) 02/04/2022   Lab Results  Component Value Date   INSULIN 13.4 02/04/2022   Lab Results  Component Value Date   TSH 1.300 07/15/2022   CBC    Component Value Date/Time   WBC 6.3 08/14/2022 0840   RBC 4.56 08/14/2022 0840   HGB 13.0 08/14/2022 0840   HGB 12.6 07/15/2022 1023   HCT 41.0 08/14/2022 0840   HCT 38.5 07/15/2022 1023   PLT 408 (H) 08/14/2022 0840   PLT 384 07/15/2022 1023   MCV 89.9 08/14/2022 0840   MCV 88 07/15/2022 1023   MCH 28.5 08/14/2022 0840   MCHC 31.7 08/14/2022 0840   RDW  13.8 08/14/2022 0840   RDW 12.8 07/15/2022 1023   Iron Studies No results found for: "IRON", "TIBC", "FERRITIN", "IRONPCTSAT" Lipid Panel     Component Value Date/Time   CHOL 218 (H) 07/15/2022 1023   TRIG 60 07/15/2022 1023   HDL 68 07/15/2022 1023   CHOLHDL 3.2 07/15/2022 1023   LDLCALC 139 (H) 07/15/2022 1023   Hepatic Function Panel     Component Value Date/Time   PROT 7.1 08/14/2022 0840   PROT 6.6 07/15/2022 1023   ALBUMIN 3.5 08/14/2022 0840   ALBUMIN 4.0 07/15/2022 1023   AST 17 08/14/2022 0840   ALT 16 08/14/2022 0840   ALKPHOS 90 08/14/2022 0840   BILITOT 0.8 08/14/2022 0840   BILITOT 0.3 07/15/2022 1023      Component Value Date/Time   TSH 1.300 07/15/2022 1023   Nutritional Lab Results  Component Value Date   VD25OH 39.1 07/15/2022   VD25OH 32.8 02/04/2022     ASSESSMENT AND PLAN  TREATMENT PLAN FOR OBESITY:  Recommended Dietary Goals  Kristen Woods is currently in the action stage of change. As such, her goal is to continue weight management plan. She has agreed to the Category 3 Plan.  Behavioral Intervention  We discussed the following Behavioral Modification Strategies today: increasing lean protein intake, decreasing simple carbohydrates , increasing vegetables, increasing lower glycemic fruits, increasing fiber rich foods, avoiding skipping meals, increasing water intake, continue to practice mindfulness when eating, and planning for success.  Additional resources provided today: Multiple handouts given today:  cat 3 meal plan, additional breakfast options, smart fruit choices, etc.    Recommended Physical Activity Goals  Kristen Woods has been advised to work up to 150 minutes of moderate intensity aerobic activity a week and strengthening exercises 2-3 times per week for cardiovascular health, weight loss maintenance and preservation of muscle mass.   She has agreed to Continue current level of physical activity , Think about ways to increase  physical activity, and Work on scheduling and tracking physical activity.    ASSOCIATED CONDITIONS ADDRESSED TODAY  Action/Plan  Essential hypertension Continue to follow up with PCP. Continue meds as directed.   Morbid obesity (HCC)  BMI 45.0-49.9, adult (HCC)         Return in about 2 weeks (around 09/08/2022).Marland Kitchen She was informed of the importance of frequent follow up visits to maximize her success with intensive lifestyle modifications for her multiple health conditions.   ATTESTASTION STATEMENTS:  Reviewed by clinician on day of visit: allergies, medications, problem list, medical history, surgical history, family history, social history, and previous encounter notes.   Time spent on visit including pre-visit chart review and post-visit care  and charting was 30 minutes.    Theodis Sato. Lille Karim FNP-C

## 2022-08-26 ENCOUNTER — Ambulatory Visit (INDEPENDENT_AMBULATORY_CARE_PROVIDER_SITE_OTHER): Payer: Commercial Managed Care - HMO | Admitting: Psychology

## 2022-08-26 DIAGNOSIS — F418 Other specified anxiety disorders: Secondary | ICD-10-CM

## 2022-08-26 DIAGNOSIS — F3289 Other specified depressive episodes: Secondary | ICD-10-CM

## 2022-08-26 NOTE — Progress Notes (Signed)
[facility] Counselor/Therapist Progress Note  Patient ID: Kristen Woods, MRN: [id number]    Date: 08/26/22  Time Spent: 10:18  am - 11:03 am : 45 Minutes  Treatment Type: Individual Therapy.  Reported Symptoms: depression and anxiety  Mental Status Exam: Appearance:  Casual     Behavior: Appropriate  Motor: Normal  Speech/Language:  Clear and Coherent  Affect: Flat  Mood: dysthymic  Thought process: normal  Thought content:   WNL  Sensory/Perceptual disturbances:   WNL  Orientation: oriented to person, place, time/date, and situation  Attention: Good  Concentration: Good  Memory: WNL  Fund of knowledge:  Good  Insight:   Good  Judgment:  Good  Impulse Control: Good   Risk Assessment: Danger to Self:  No Self-injurious Behavior: No Danger to Others: No Duty to Warn:no Physical Aggression / Violence:No  Access to Firearms a concern: No  Gang Involvement:No   Subjective:   Kristen Woods participated in the session, in person in the office with the therapist, and consented to treatment Letisia reviewed the events of the past week. Kristen Woods noted frustration regarding interaction by her specific family members and noted having an estranged sister contact her and curse at her. She provided history regarding family dynamics and why she has disconnected from numerous family members. Kristen Woods resigned from the job. She noted being told to keep her work related belongings and to return to work when she is medically better. She noted continued frustration regarding her health and her doctors "not knowing what is wrong with me". She noted her sleep being poor and noted this possibly due to stress. She noted her daughter inviting her to move to [place], permanently, and Kristen Woods noted her consideration of this. We explored this and the pros and cons of her move. She noted her daughter's encouragement to make this move. She noted frustration regarding the possibility and having to "start  over". We explored this during the session and Kristen Woods noted her intent to create a pros and cons list. She noted her hope that she can live more carefree and noted being "responsible for someone or something" from a young age. We worked on processing this during the session. Therapist praised Kristen Woods for her intent to create a pros and cons list regarding the possible move. Therapist validated Kristen Woods's feelings and experience and provided supportive therapy. Kristen Woods was engaged and motivated and expressed commitment towards our goals. We scheduled a follow-up for continued treatment.    Interventions: CBT & interpersonal.   Diagnosis:   Other specified anxiety disorders  Other depression  Psychiatric Treatment: No , but will be referred to psychiatric provider.   Treatment Plan:  Client Abilities/Strengths Kristen Woods is intelligent, well spoken, and expressive.   Support System: Family and friends.   Client Treatment Preferences Outpatient therapy.   Client Statement of Needs Mita would like to reduce castrophizing, improve self-talk, build and maintain positive habits, process past events and life stressors, processing the loss of mother and family dynamics after passing, processing the care-taker role of mom and lack of family support, process abandonment by family. Additional goals include bolstering coping skills, creating a healthful daily routine, engaging in consistent self-care, improve mindfulness, reduce rumination, and build a sense of self.   Treatment Level Weekly  Symptoms  Anxiety: feeling nervous, difficulty managing worry, worrying about different things, trouble relaxing.    (Status: maintained) Depression:  loss of interest, feeling down, poor sleep, lethargy, fluctuating appetite, feeling bad about self, difficulty concentrating, emotional eating.   (  Status: maintained)  Goals:   [name] experiences symptoms of depression and anxiety.    Target Date: 07/29/23 Frequency:  Weekly  Progress: 0 Modality: individual    Therapist will provide referrals for additional resources as appropriate.  Therapist will provide psycho-education regarding Kristen Woods's diagnosis and corresponding treatment approaches and interventions. Licensed Clinical Social Worker, [place], LCSW will support the patient's ability to achieve the goals identified. will employ CBT, BA, Problem-solving, Solution Focused, Mindfulness,  coping skills, & other evidenced-based practices will be used to promote progress towards healthy functioning to help manage decrease symptoms associated with her diagnosis.   Reduce overall level, frequency, and intensity of the feelings of depression, anxiety and panic evidenced by decreased overall symptoms from 6 to 7 days/week to 0 to 1 days/week per client report for at least 3 consecutive months. Verbally express understanding of the relationship between feelings of depression, anxiety and their impact on thinking patterns and behaviors. Verbalize an understanding of the role that distorted thinking plays in creating fears, excessive worry, and ruminations.  [name] participated in the creation of the treatment plan)    Delight Ovens, LCSW

## 2022-08-27 ENCOUNTER — Telehealth: Payer: Self-pay | Admitting: Family Medicine

## 2022-08-27 NOTE — Telephone Encounter (Signed)
Copied from CRM [id number]. Topic: Referral - Request for Referral >> Aug 27, 2022  8:42 AM Dondra Prader A wrote: Has patient seen PCP for this complaint? Yes.   *If NO, is insurance requiring patient see PCP for this issue before PCP can refer them? Referral for which specialty: rheumatologist Preferred provider/office: Pt would like PCP to refer  Reason for referral: Pt states that she has been back and forth to the hospital and was advised since nothing has been found she need to be referred to a Rheumatologist.

## 2022-08-29 NOTE — Telephone Encounter (Signed)
Patient given appt 

## 2022-09-02 ENCOUNTER — Encounter: Payer: Self-pay | Admitting: Family Medicine

## 2022-09-02 ENCOUNTER — Telehealth (INDEPENDENT_AMBULATORY_CARE_PROVIDER_SITE_OTHER): Payer: Commercial Managed Care - HMO | Admitting: Family Medicine

## 2022-09-02 DIAGNOSIS — R490 Dysphonia: Secondary | ICD-10-CM | POA: Diagnosis not present

## 2022-09-02 DIAGNOSIS — M79605 Pain in left leg: Secondary | ICD-10-CM | POA: Diagnosis not present

## 2022-09-02 NOTE — Progress Notes (Signed)
Virtual Visit via Video Note  I connected with Kristen Woods on 09/02/22 at  3:40 PM EDT by a video enabled telemedicine application and verified that I am speaking with the correct person using two identifiers.  Location: Patient: Kristen Woods Provider: [place]   I discussed the limitations of evaluation and management by telemedicine and the availability of in person appointments. The patient expressed understanding and agreed to proceed.  History of Present Illness: Patient reports persistent hoarseness but still unable to keep long conversations. Patient also reports that her left leg has been difficult to move. Patient denies known trauma or injury   Observations/Objective:   Assessment and Plan:  1. Hoarseness, persistent Referral to neuro for further eval/mgt - Ambulatory referral to Neurology  2. Left leg pain As above - Ambulatory referral to Neurology  Follow Up Instructions:    I discussed the assessment and treatment plan with the patient. The patient was provided an opportunity to ask questions and all were answered. The patient agreed with the plan and demonstrated an understanding of the instructions.   The patient was advised to call back or seek an in-person evaluation if the symptoms worsen or if the condition fails to improve as anticipated.  I provided 9 minutes of non-face-to-face time during this encounter.   Tommie Raymond, MD

## 2022-09-03 ENCOUNTER — Ambulatory Visit
Admission: RE | Admit: 2022-09-03 | Discharge: 2022-09-03 | Disposition: A | Discharge status: Home / Self Care | Payer: Commercial Managed Care - HMO | Source: Ambulatory Visit | Attending: Family Medicine | Admitting: Family Medicine

## 2022-09-03 DIAGNOSIS — Z1231 Encounter for screening mammogram for malignant neoplasm of breast: Secondary | ICD-10-CM

## 2022-09-08 ENCOUNTER — Ambulatory Visit: Payer: Commercial Managed Care - HMO | Admitting: Nurse Practitioner

## 2022-09-09 ENCOUNTER — Ambulatory Visit (INDEPENDENT_AMBULATORY_CARE_PROVIDER_SITE_OTHER): Payer: Commercial Managed Care - HMO | Admitting: Psychology

## 2022-09-09 DIAGNOSIS — F3289 Other specified depressive episodes: Secondary | ICD-10-CM | POA: Diagnosis not present

## 2022-09-09 DIAGNOSIS — F418 Other specified anxiety disorders: Secondary | ICD-10-CM

## 2022-09-09 NOTE — Progress Notes (Signed)
[facility] Counselor/Therapist Progress Note  Patient ID: Shelly Bearfield, MRN: [id number]    Date: 09/09/22  Time Spent: 12:47 pm - 1:47 pm : 60 Minutes  Treatment Type: Individual Therapy.  Reported Symptoms: depression and anxiety  Mental Status Exam: Appearance:  Casual     Behavior: Appropriate  Motor: Normal  Speech/Language:  Clear and Coherent  Affect: Flat  Mood: dysthymic  Thought process: normal  Thought content:   WNL  Sensory/Perceptual disturbances:   WNL  Orientation: oriented to person, place, time/date, and situation  Attention: Good  Concentration: Good  Memory: WNL  Fund of knowledge:  Good  Insight:   Good  Judgment:  Good  Impulse Control: Good   Risk Assessment: Danger to Self:  No Self-injurious Behavior: No Danger to Others: No Duty to Warn:no Physical Aggression / Violence:No  Access to Firearms a concern: No  Gang Involvement:No   Subjective:   [name] participated in the session, in person in the office with the therapist, and consented to treatment Orquidea reviewed the events of the past week. [name] noted "not handling these emotions correctly. [name] noted the pressures of life and noted her daughter's continued pressure to have [name] to move in with her, in [place]. She noted eating unhealthfully, which affects her weight, and is worsening her back pain. She noted feeling a ball of emotion but not knowing what the emotions are. She noted stress of the unknown in relation to her health issues that have yet to be diagnosed. She noted feeling stuck. Therapist highlighted [name]'s emotional reasoning & shoulds and shouldn'ts, during the session. Therapist provided psycho-education regarding cognitive distortions and the effect of these on mood. We identified a possible core belief "I am responsible". She noted a need to rethink what she needs to be responsible in a more balanced. Therapist encouraged [name] to create a list of her perceived  responsibilities to be processed during the follow-up. We worked on processing feelings her experience as a child being parentified and being told to take responsibility for other. Therapist validated [name]'s feelings and experience and provided supportive therapy. [name] was engaged and motivated and expressed commitment towards our goals. We scheduled a follow-up for continued treatment.    Interventions: CBT & interpersonal.   Diagnosis:   Other specified anxiety disorders  Other depression  Psychiatric Treatment: No , but will be referred to psychiatric provider.   Treatment Plan:  Client Abilities/Strengths Kymira is intelligent, well spoken, and expressive.   Support System: Family and friends.   Client Treatment Preferences Outpatient therapy.   Client Statement of Needs Marylou would like to reduce castrophizing, improve self-talk, build and maintain positive habits, process past events and life stressors, processing the loss of mother and family dynamics after passing, processing the care-taker role of mom and lack of family support, process abandonment by family. Additional goals include bolstering coping skills, creating a healthful daily routine, engaging in consistent self-care, improve mindfulness, reduce rumination, and build a sense of self.   Treatment Level Weekly  Symptoms  Anxiety: feeling nervous, difficulty managing worry, worrying about different things, trouble relaxing.    (Status: maintained) Depression:  loss of interest, feeling down, poor sleep, lethargy, fluctuating appetite, feeling bad about self, difficulty concentrating, emotional eating.   (Status: maintained)  Goals:   Buna experiences symptoms of depression and anxiety.    Target Date: 07/29/23 Frequency: Weekly  Progress: 0 Modality: individual    Therapist will provide referrals for additional resources as appropriate.  Therapist will provide psycho-education regarding Erynne's  diagnosis and corresponding treatment approaches and interventions. Licensed Clinical Social Worker, [place], LCSW will support the patient's ability to achieve the goals identified. will employ CBT, BA, Problem-solving, Solution Focused, Mindfulness,  coping skills, & other evidenced-based practices will be used to promote progress towards healthy functioning to help manage decrease symptoms associated with her diagnosis.   Reduce overall level, frequency, and intensity of the feelings of depression, anxiety and panic evidenced by decreased overall symptoms from 6 to 7 days/week to 0 to 1 days/week per client report for at least 3 consecutive months. Verbally express understanding of the relationship between feelings of depression, anxiety and their impact on thinking patterns and behaviors. Verbalize an understanding of the role that distorted thinking plays in creating fears, excessive worry, and ruminations.  [name] participated in the creation of the treatment plan)    Delight Ovens, LCSW

## 2022-09-11 ENCOUNTER — Telehealth: Payer: Self-pay | Admitting: *Deleted

## 2022-09-11 NOTE — Telephone Encounter (Signed)
Copied from CRM [id number]. Topic: General - Other >> Sep 10, 2022  9:45 AM Kristen Woods wrote: Reason for CRM:  please call regarding May 15th results from [facility].  Due to swollen left breast, she was informed that she would need a different kind of exam  Please advis

## 2022-09-14 ENCOUNTER — Ambulatory Visit
Admission: RE | Admit: 2022-09-14 | Discharge: 2022-09-14 | Disposition: A | Discharge status: Home / Self Care | Payer: Commercial Managed Care - HMO | Source: Ambulatory Visit | Attending: Physician Assistant | Admitting: Physician Assistant

## 2022-09-14 DIAGNOSIS — M4316 Spondylolisthesis, lumbar region: Secondary | ICD-10-CM

## 2022-09-16 ENCOUNTER — Other Ambulatory Visit: Payer: Self-pay | Admitting: Family Medicine

## 2022-09-16 DIAGNOSIS — N63 Unspecified lump in unspecified breast: Secondary | ICD-10-CM

## 2022-09-23 ENCOUNTER — Ambulatory Visit (INDEPENDENT_AMBULATORY_CARE_PROVIDER_SITE_OTHER): Payer: Medicaid Other | Admitting: Psychology

## 2022-09-23 ENCOUNTER — Ambulatory Visit (INDEPENDENT_AMBULATORY_CARE_PROVIDER_SITE_OTHER): Payer: Medicaid Other | Admitting: Orthopedic Surgery

## 2022-09-23 DIAGNOSIS — F3289 Other specified depressive episodes: Secondary | ICD-10-CM

## 2022-09-23 DIAGNOSIS — F418 Other specified anxiety disorders: Secondary | ICD-10-CM

## 2022-09-23 DIAGNOSIS — M4316 Spondylolisthesis, lumbar region: Secondary | ICD-10-CM

## 2022-09-23 NOTE — Progress Notes (Signed)
[facility] Counselor/Therapist Progress Note  Patient ID: Kristen Woods, MRN: [id number]    Date: 09/23/22  Time Spent: 1:02 pm - 1:49 pm : 47 Minutes  Treatment Type: Individual Therapy.  Reported Symptoms: depression and anxiety  Mental Status Exam: Appearance:  Casual     Behavior: Appropriate  Motor: Normal  Speech/Language:  Clear and Coherent  Affect: Appropriate  Mood: normal  Thought process: normal  Thought content:   WNL  Sensory/Perceptual disturbances:   WNL  Orientation: oriented to person, place, time/date, and situation  Attention: Good  Concentration: Good  Memory: WNL  Fund of knowledge:  Good  Insight:   Good  Judgment:  Good  Impulse Control: Good   Risk Assessment: Danger to Self:  No Self-injurious Behavior: No Danger to Others: No Duty to Warn:no Physical Aggression / Violence:No  Access to Firearms a concern: No  Gang Involvement:No   Subjective:   Kristen Woods participated in the session, in person in the office with the therapist, and consented to treatment. Kristen Woods reviewed the events of the past week. She noted reflecting on her past and identifying some of the positives despite the stress. She noted finding more balance in her perspective. Therapist encouraged Kristen Woods to continue to work on identifying and acknowledging various perspectives and identify positives in stressful situations. She noted her efforts to continue adopting a positive attitude. She noted her [place] live with one of her daughters and noted excitement regarding the transition. She noted a need to continue to set boundaries with family. Therapist praised Kristen Woods for her effort during the session and encouraged her to continue maintaining her balance, self-care, and boundaries. Kristen Woods was engaged and motivated and expressed commitment towards our goals. A follow-up was scheduled for continued treatment.   Interventions: CBT & interpersonal.    Diagnosis:   Other specified anxiety disorders  Other depression  Psychiatric Treatment: No , but will be referred to psychiatric provider.   Treatment Plan:  Client Abilities/Strengths Kristen Woods is intelligent, well spoken, and expressive.   Support System: Family and friends.   Client Treatment Preferences Outpatient therapy.   Client Statement of Needs Kristen Woods would like to reduce castrophizing, improve self-talk, build and maintain positive habits, process past events and life stressors, processing the loss of mother and family dynamics after passing, processing the care-taker role of mom and lack of family support, process abandonment by family. Additional goals include bolstering coping skills, creating a healthful daily routine, engaging in consistent self-care, improve mindfulness, reduce rumination, and build a sense of self.   Treatment Level Weekly  Symptoms  Anxiety: feeling nervous, difficulty managing worry, worrying about different things, trouble relaxing.    (Status: maintained) Depression:  loss of interest, feeling down, poor sleep, lethargy, fluctuating appetite, feeling bad about self, difficulty concentrating, emotional eating.   (Status: maintained)  Goals:   Kristen Woods experiences symptoms of depression and anxiety.    Target Date: 07/29/23 Frequency: Weekly  Progress: 0 Modality: individual    Therapist will provide referrals for additional resources as appropriate.  Therapist will provide psycho-education regarding Kristen Woods's diagnosis and corresponding treatment approaches and interventions. Licensed Clinical Social Worker, [place], LCSW will support the patient's ability to achieve the goals identified. will employ CBT, BA, Problem-solving, Solution Focused, Mindfulness,  coping skills, & other evidenced-based practices will be used to promote progress towards healthy functioning to help manage decrease symptoms associated with her diagnosis.    Reduce overall level, frequency, and intensity of the  feelings of depression, anxiety and panic evidenced by decreased overall symptoms from 6 to 7 days/week to 0 to 1 days/week per client report for at least 3 consecutive months. Verbally express understanding of the relationship between feelings of depression, anxiety and their impact on thinking patterns and behaviors. Verbalize an understanding of the role that distorted thinking plays in creating fears, excessive worry, and ruminations.  Kristen Woods participated in the creation of the treatment plan)    Kristen Ovens, LCSW

## 2022-10-01 ENCOUNTER — Ambulatory Visit
Admission: RE | Admit: 2022-10-01 | Discharge: 2022-10-01 | Disposition: A | Discharge status: Home / Self Care | Payer: Commercial Managed Care - HMO | Source: Ambulatory Visit | Attending: Family Medicine | Admitting: Family Medicine

## 2022-10-01 ENCOUNTER — Ambulatory Visit
Admission: RE | Admit: 2022-10-01 | Discharge: 2022-10-01 | Disposition: A | Discharge status: Home / Self Care | Payer: Medicaid Other | Source: Ambulatory Visit | Attending: Family Medicine | Admitting: Family Medicine

## 2022-10-01 ENCOUNTER — Other Ambulatory Visit: Payer: Self-pay | Admitting: Family Medicine

## 2022-10-01 ENCOUNTER — Other Ambulatory Visit: Payer: Self-pay

## 2022-10-01 DIAGNOSIS — N6310 Unspecified lump in the right breast, unspecified quadrant: Secondary | ICD-10-CM

## 2022-10-01 DIAGNOSIS — N63 Unspecified lump in unspecified breast: Secondary | ICD-10-CM

## 2022-10-01 DIAGNOSIS — J387 Other diseases of larynx: Secondary | ICD-10-CM | POA: Diagnosis not present

## 2022-10-01 DIAGNOSIS — R490 Dysphonia: Secondary | ICD-10-CM | POA: Diagnosis not present

## 2022-10-01 DIAGNOSIS — J383 Other diseases of vocal cords: Secondary | ICD-10-CM | POA: Diagnosis not present

## 2022-10-01 NOTE — Progress Notes (Addendum)
   Kristen Woods May 23, 1965 [id number]  Patient outreached by Alesia Banda , PharmD Candidate on 10/01/2022.  Blood Pressure Readings: Last documented ambulatory systolic blood pressure: 138 Last documented ambulatory diastolic blood pressure: 95 Does the patient have a validated home blood pressure machine?: Yes They report home readings systolic ranging from 110-140s. Patient may be experiencing relative hypotension due to lowest systolic BP being in the 110s but patient is feeling fatigue to the point that she does not want to get out of bed. When systolic BP is 140s, she feels fine and is able to do daily activities.  Medication review was performed. Is the patient taking their medications as prescribed?: Yes Differences from their prescribed list include: No  The following barriers to adherence were noted: Does the patient have cost concerns?: Yes Does the patient have transportation concerns?: No Does the patient need assistance obtaining refills?: No Does the patient occassionally forget to take some of their prescribed medications?: No Does the patient feel like one/some of their medications make them feel poorly?: Yes Does the patient have questions or concerns about their medications?: Yes Does the patient have a follow up scheduled with their primary care provider/cardiologist?: Yes   Interventions: Interventions Completed: Medications were reviewed, Patient was educated on goal blood pressures and long term health implications of elevated blood pressure, Patient was educated on proper technique to check home blood pressure and reminded to bring home machine and readings to next provider appointment, Patient was educated on medications, including indication and administration, Patient was counseled on lifestyle modifications to improve blood pressure, including  The patient has follow up scheduled:  PCP: Georganna Skeans, MD   Alesia Banda, Student-PharmD

## 2022-10-01 NOTE — Patient Instructions (Signed)
Hi Ms. Kristen, Woods let me know that you felt like you were having symptoms of low blood pressure, even at "normal" readings. We call this "symptomatic hypotension".   I've put some information about symptoms of low blood pressure below. Also pay attention to WHEN you feel these symptoms. If this happens when moving (from laying down to sitting, sitting to standing), talk to Dr. Andrey Campanile about this, as it could be something called "postural" or "orthostatic" hypotension.   Thanks!  Catie Clearance Coots, PharmD  Hypotension As the heart beats, it forces blood through the body. Hypotension, commonly called low blood pressure, is when the force of blood pumping through the arteries is too weak. Arteries are blood vessels that carry blood from the heart throughout the body. Depending on the cause and severity, hypotension may be harmless (benign) or may cause serious problems (be critical). When your blood pressure is too low, you may not get enough blood to your brain or to the rest of your organs. This can cause weakness, light-headedness, a rapid heartbeat, and fainting. What are the causes? This condition may be caused by: Blood loss. Loss of body fluids (dehydration). Heart problems. Hormone (endocrine) problems. Pregnancy. Severe infection. Lack of certain nutrients. Severe allergic reactions (anaphylaxis). Certain medicines, such as blood pressure medicine or medicines that make the body lose excess fluids (diuretics). Sometimes, hypotension may be caused by not taking medicine as directed, such as taking too much of a certain medicine. What increases the risk? The following factors may make you more likely to develop this condition: Age. Risk increases as you get older. Having a condition that affects the heart or the central nervous system. What are the signs or symptoms? Common symptoms of this condition include: Weakness. Light-headedness. Dizziness. Blurred vision. Tiredness  (fatigue). Rapid heartbeat. Fainting, in severe cases. How is this diagnosed? This condition is diagnosed based on: Your medical history. Your symptoms. Your blood pressure measurement. Your health care provider will check your blood pressure when you are: Lying down. Sitting. Standing. A blood pressure reading is recorded as two numbers, such as "120 over 80" (or 120/80). The first ("top") number is called the systolic pressure. It is a measure of the pressure in your arteries as your heart beats. The second ("bottom") number is called the diastolic pressure. It is a measure of the pressure in your arteries when your heart relaxes between beats. Blood pressure is measured in a unit called mm Hg. Healthy blood pressure for most adults is 120/80. If your blood pressure is below 90/60, you may be diagnosed with hypotension. Other information or tests that may be used to diagnose hypotension include: Your other vital signs, such as your heart rate and temperature. Blood tests. Tilt table test. For this test, you will be safely secured to a table that moves you from a lying position to an upright position. Your heart rhythm and blood pressure will be monitored during the test. How is this treated? Treatment for this condition may include: Changing your diet. This may involve drinking more water or increasing your salt (sodium) intake with high-sodium foods. Taking medicines to raise your blood pressure. Changing the dosage of certain medicines you are taking that might be lowering your blood pressure. Wearing compression stockings. These stockings help to prevent blood clots and reduce swelling in your legs. In some cases, you may need to go to the hospital for: Fluid replacement. This means you will receive fluids through an IV. Blood replacement. This means  you will receive donated blood through an IV (transfusion). Treating an infection or heart problems, if this applies. Monitoring. You may  need to be monitored while medicines that you are taking wear off. Follow these instructions at home: Eating and drinking  Drink enough fluid to keep your urine pale yellow. Eat a healthy diet, and follow instructions from your health care provider about eating or drinking restrictions. A healthy diet includes: Fresh fruits and vegetables. Whole grains. Lean meats. Low-fat dairy products. Increase your salt intake if told to do so. Do not add extra salt to your diet unless your health care provider tells you to do that. Eat frequent, small meals. Avoid standing up suddenly after eating. Medicines Take over-the-counter and prescription medicines only as told by your health care provider. Follow instructions from your health care provider about changing the dosage of your current medicines, if this applies. Do not stop or adjust any of your medicines on your own. General instructions  Wear compression stockings as told by your health care provider. Get up slowly from lying down or sitting positions. This gives your blood pressure a chance to adjust. Avoid hot showers and excessive heat as directed by your health care provider. Return to your normal activities as told by your health care provider. Ask your health care provider what activities are safe for you. Do not use any products that contain nicotine or tobacco. These products include cigarettes, chewing tobacco, and vaping devices, such as e-cigarettes. If you need help quitting, ask your health care provider. Keep all follow-up visits. This is important. Contact a health care provider if: You vomit. You have diarrhea. You have a fever for more than 2-3 days. You feel more thirsty than usual. You feel weak and tired. Get help right away if: You have chest pain. You have a fast or irregular heartbeat. You develop numbness in any part of your body. You cannot move your arms or your legs. You have trouble speaking. You become  sweaty or feel light-headed. You faint. You feel short of breath. You have trouble staying awake. You feel confused. These symptoms may be an emergency. Get help right away. Call 911. Do not wait to see if the symptoms will go away. Do not drive yourself to the hospital. Summary Hypotension is when the force of blood pumping through the arteries is too weak. Hypotension may be harmless (benign) or may cause serious problems (be critical). Treatment for this condition may include changing your diet, changing your medicines, and wearing compression stockings. In some cases, you may need to go to the hospital for fluid or blood replacement. This information is not intended to replace advice given to you by your health care provider. Make sure you discuss any questions you have with your health care provider. Document Revised: 11/26/2020 Document Reviewed: 11/26/2020 Elsevier Patient Education  2024 [employer].

## 2022-10-02 ENCOUNTER — Encounter: Payer: Self-pay | Admitting: Family Medicine

## 2022-10-02 ENCOUNTER — Ambulatory Visit (INDEPENDENT_AMBULATORY_CARE_PROVIDER_SITE_OTHER): Payer: Medicaid Other | Admitting: Family Medicine

## 2022-10-02 VITALS — BP 125/91 | HR 74 | Temp 98.1°F | Resp 16 | Wt 313.0 lb

## 2022-10-02 DIAGNOSIS — I10 Essential (primary) hypertension: Secondary | ICD-10-CM | POA: Diagnosis not present

## 2022-10-02 DIAGNOSIS — R490 Dysphonia: Secondary | ICD-10-CM

## 2022-10-02 DIAGNOSIS — Z6841 Body Mass Index (BMI) 40.0 and over, adult: Secondary | ICD-10-CM | POA: Diagnosis not present

## 2022-10-02 NOTE — Progress Notes (Signed)
-  Patient concern about weight and not being able to cook correctly. -Patient said her left breast and leg is swollen due to everything going on in her life. - Patient want something for weight lost

## 2022-10-02 NOTE — Progress Notes (Signed)
Established Patient Office Visit  Subjective    Patient ID: Kristen Woods, female    DOB: July 20, 1965  Age: 57 y.o. MRN: [id number]  CC: No chief complaint on file.   HPI Kristen Woods presents for follow up of hoarseness and chronic med issues.    Outpatient Encounter Medications as of 10/02/2022  Medication Sig   montelukast (SINGULAIR) 10 MG tablet Take 10 mg by mouth at bedtime.   acetaminophen-codeine (TYLENOL #3) 300-30 MG tablet Take 1 tablet by mouth every 6 (six) hours as needed for moderate pain. (Patient not taking: Reported on 08/14/2022)   albuterol (VENTOLIN HFA) 108 (90 Base) MCG/ACT inhaler Inhale 2 puffs into the lungs every 6 (six) hours as needed for wheezing or shortness of breath. (Patient not taking: Reported on 08/14/2022)   cyclobenzaprine (FLEXERIL) 10 MG tablet Take 1 tablet (10 mg total) by mouth 2 (two) times daily as needed for muscle spasms. (Patient not taking: Reported on 08/18/2022)   diclofenac Sodium (VOLTAREN) 1 % GEL Apply 2 g topically 4 (four) times daily as needed. (Patient not taking: Reported on 08/14/2022)   diltiazem (CARDIZEM LA) 180 MG 24 hr tablet Take 1 tablet (180 mg total) by mouth daily. (Patient not taking: Reported on 08/18/2022)   ibuprofen (ADVIL) 800 MG tablet Take 800 mg by mouth 3 (three) times daily. (Patient not taking: Reported on 08/18/2022)   lisinopril-hydrochlorothiazide (ZESTORETIC) 20-25 MG tablet Take 1 tablet by mouth daily.   omeprazole (PRILOSEC) 40 MG capsule Take 1 capsule (40 mg total) by mouth daily.   promethazine-dextromethorphan (PROMETHAZINE-DM) 6.25-15 MG/5ML syrup Take 5 mLs by mouth 4 (four) times daily as needed for cough. (Patient not taking: Reported on 08/14/2022)   Spacer/Aero-Holding Chambers DEVI 1 Units by Does not apply route in the morning, at noon, and at bedtime. (Patient not taking: Reported on 08/14/2022)   No facility-administered encounter medications on file as of 10/02/2022.    Past Medical  History:  Diagnosis Date   Back pain    Chronic fatigue syndrome    Depression    High cholesterol    Hypertension    Joint pain    Joint pain    Leg pain    Liver disease    Nerves    Sleep apnea    Swelling of lower extremity    Vision abnormalities     Past Surgical History:  Procedure Laterality Date   BREAST SURGERY     INCONTINENCE SURGERY     KNEE SURGERY      Family History  Problem Relation Age of Onset   Hypertension Mother    Heart disease Mother    Diabetes Father    Hyperlipidemia Other    Heart attack Neg Hx    Sudden death Neg Hx     Social History   Socioeconomic History   Marital status: Single    Spouse name: Not on file   Number of children: Not on file   Years of education: Not on file   Highest education level: Associate degree: occupational, Scientist, product/process development, or vocational program  Occupational History   Not on file  Tobacco Use   Smoking status: Never   Smokeless tobacco: Not on file  Substance and Sexual Activity   Alcohol use: No   Drug use: No   Sexual activity: Not on file  Other Topics Concern   Not on file  Social History Narrative   Not on file   Social Determinants of Health   Financial  Resource Strain: Medium Risk (08/18/2022)   Overall Financial Resource Strain (CARDIA)    Difficulty of Paying Living Expenses: Somewhat hard  Food Insecurity: Food Insecurity Present (08/18/2022)   Hunger Vital Sign    Worried About Running Out of Food in the Last Year: Sometimes true    Ran Out of Food in the Last Year: Not on file  Transportation Needs: No Transportation Needs (08/18/2022)   PRAPARE - Administrator, Civil Service (Medical): No    Lack of Transportation (Non-Medical): No  Physical Activity: Insufficiently Active (08/18/2022)   Exercise Vital Sign    Days of Exercise per Week: 2 days    Minutes of Exercise per Session: 10 min  Stress: Stress Concern Present (08/18/2022)   [employer] of Occupational Health  - Occupational Stress Questionnaire    Feeling of Stress : To some extent  Social Connections: Socially Isolated (08/18/2022)   Social Connection and Isolation Panel [NHANES]    Frequency of Communication with Friends and Family: Once a week    Frequency of Social Gatherings with Friends and Family: Never    Attends Religious Services: 1 to 4 times per year    Active Member of [employer] or Organizations: No    Attends Engineer, structural: Not on file    Marital Status: Never married  Intimate Partner Violence: Not on file    Review of Systems  All other systems reviewed and are negative.       Objective    BP (!) 125/91   Pulse 74   Temp 98.1 F (36.7 C) (Oral)   Resp 16   Wt (!) 313 lb (142 kg)   LMP 01/04/2015   SpO2 93%   BMI 50.52 kg/m   Physical Exam Vitals and nursing note reviewed.  Constitutional:      General: She is not in acute distress.    Appearance: She is obese.  HENT:     Head:     Comments: Hoarseness persists    Mouth/Throat:     Mouth: Mucous membranes are moist.     Pharynx: No oropharyngeal exudate or posterior oropharyngeal erythema.  Cardiovascular:     Rate and Rhythm: Normal rate and regular rhythm.  Pulmonary:     Effort: Pulmonary effort is normal.     Breath sounds: Normal breath sounds.  Musculoskeletal:     Cervical back: Normal range of motion and neck supple.  Neurological:     General: No focal deficit present.     Mental Status: She is alert and oriented to person, place, and time.         Assessment & Plan:   1. Hoarseness, persistent Keep follow up with specialist scheduled for August 2024  2. Class 3 severe obesity due to excess calories with serious comorbidity and body mass index (BMI) of 50.0 to 59.9 in adult [facility]) Discussed dietary and activity options. Patient to follow up with weight specialist.   3. Essential hypertension Appears stable. Continue     Return in about 3 months (around 01/02/2023)  for follow up.   Tommie Raymond, MD

## 2022-10-05 ENCOUNTER — Encounter: Payer: Self-pay | Admitting: Orthopedic Surgery

## 2022-10-05 NOTE — Progress Notes (Signed)
Office Visit Note   Patient: Kristen Woods           Date of Birth: Feb 10, 1966           MRN: [id number] Visit Date: 09/23/2022              Requested by: Nadara Mustard, MD [address] [place],  [place] [address] PCP: Georganna Skeans, MD  Chief Complaint  Patient presents with   Left Leg - Pain      HPI: Patient is a 57 year old woman who presents with left-sided radicular symptoms.  Patient also complains of increased swelling.  She has had physical therapy and has had 1 epidural steroid injection in the past.  Patient has also seen Dr. Magnus Ivan and has had an MRI scan.  She complains of swelling in her left neck and left chest for which she was evaluated with ENT.  Patient denies any arm pain or weakness.  Assessment & Plan: Visit Diagnoses:  1. Spondylolisthesis, lumbar region     Plan: Will refer patient to see Dr. Alvester Morin for evaluation for epidural steroid injection.  Follow-Up Instructions: No follow-ups on file.   Ortho Exam  Patient is alert, oriented, no adenopathy, well-dressed, normal affect, normal respiratory effort. Examination patient has lower back pain with left-sided radicular symptoms.  She has a negative straight leg raise.  She does have weakness with hip flexion on the left.  She has good plantarflexion and dorsiflexion of the ankle.  Review of the MRI scan obtained May 26 shows facet arthropathy at L4-5 with a 3 mm anterior listhesis.  Imaging: No results found. No images are attached to the encounter.  Labs: Lab Results  Component Value Date   HGBA1C 6.0 (H) 06/12/2022   HGBA1C 6.0 (H) 02/04/2022   ESRSEDRATE 41 (H) 08/14/2022   ESRSEDRATE 38 (H) 09/25/2021   CRP 16.4 (H) 09/25/2021   LABURIC 7.4 (H) 09/25/2021     Lab Results  Component Value Date   ALBUMIN 3.5 08/14/2022   ALBUMIN 4.0 07/15/2022   ALBUMIN 4.1 06/12/2022    No results found for: "MG" Lab Results  Component Value Date   VD25OH 39.1 07/15/2022   VD25OH 32.8  02/04/2022    No results found for: "PREALBUMIN"    Latest Ref Rng & Units 08/14/2022    8:40 AM 08/01/2022   11:10 AM 07/15/2022   10:23 AM  CBC EXTENDED  WBC 4.0 - 10.5 K/uL 6.3  5.4  5.3   RBC 3.87 - 5.11 MIL/uL 4.56  4.37  4.38   Hemoglobin 12.0 - 15.0 g/dL 84.6  96.2  95.2   HCT 36.0 - 46.0 % 41.0  39.0  38.5   Platelets 150 - 400 K/uL 408  401  384   NEUT# 1.7 - 7.7 K/uL 3.8   2.8   Lymph# 0.7 - 4.0 K/uL 1.5   1.8      There is no height or weight on file to calculate BMI.  Orders:  Orders Placed This Encounter  Procedures   Ambulatory referral to Physical Medicine Rehab   No orders of the defined types were placed in this encounter.    Procedures: No procedures performed  Clinical Data: No additional findings.  ROS:  All other systems negative, except as noted in the HPI. Review of Systems  Objective: Vital Signs: LMP 01/04/2015   Specialty Comments:  CLINICAL DATA:  CLINICAL DATA Spinal stenosis, left lumbar radicular pain.  New L4-5 slippage.   EXAM: MRI  LUMBAR SPINE WITHOUT CONTRAST   TECHNIQUE: Multiplanar, multisequence MR imaging of the lumbar spine was performed. No intravenous contrast was administered.   COMPARISON:  Radiography 08/13/2022.  MRI 05/31/2014.   FINDINGS: Segmentation:  5 lumbar type vertebral bodies.   Alignment: Degenerative anterolisthesis at L4-5 of 3 mm in this position. This is known to worsen with standing and flexion.   Vertebrae:  Benign appearing scattered hemangiomas/lipomas.   Conus medullaris and cauda equina: Conus extends to the L1 level. Conus and cauda equina appear normal.   Paraspinal and other soft tissues: Negative   Disc levels:   No abnormality at L2-3 or above.   L3-4: Normal appearance of the disc. Mild facet and ligamentous hypertrophy. No compressive stenosis.   L4-5: Bilateral facet arthropathy with joint effusions, left more than right. 3 mm of anterolisthesis known to worsen with  standing and flexion. Mild bulging of the disc. No compressive stenosis of the canal or foramina. Facet arthropathy could be a cause of back pain or referred facet syndrome pain.   L5-S1: Normal appearance of the disc. Minimal facet arthritis. No stenosis.   IMPRESSION: 1. L4-5: Bilateral facet arthropathy with joint effusions, left more than right. 3 mm of anterolisthesis known to worsen with standing and flexion. Mild bulging of the disc. No compressive stenosis of the canal or foramina in this position. The facet arthropathy could be a cause of back pain or referred facet syndrome pain. 2. L3-4: Mild facet osteoarthritis. No stenosis. 3. L5-S1: Mild facet osteoarthritis. No stenosis.     Electronically Signed   By: Paulina Fusi M.D.   On: 09/22/2022 15:15  PMFS History: Patient Active Problem List   Diagnosis Date Noted   Dysphonia 06/06/2022   Hoarseness 06/06/2022   Eating disorder 05/22/2022   Pre-diabetes 02/06/2022   Other fatigue 02/04/2022   SOB (shortness of breath) on exertion 02/04/2022   Essential hypertension 02/04/2022   Health care maintenance 02/04/2022   Other hyperlipidemia 02/04/2022   Elevated glucose 02/04/2022   Vitamin D deficiency 02/04/2022   Morbid obesity (HCC) 02/04/2022   Unilateral primary osteoarthritis, right knee 12/16/2021   History of total left knee replacement 12/16/2021   Obstructive sleep apnea syndrome 03/05/2021   Stress incontinence of urine 03/05/2021   Mixed hyperlipidemia 09/01/2019   Obesity, Class III, BMI 40-49.9 (morbid obesity) (HCC) 09/01/2019   Chronic anemia 06/13/2019   DDD (degenerative disc disease), cervical 06/13/2019   DDD (degenerative disc disease), lumbosacral 06/13/2019   Peripheral edema 06/13/2019   Gastroesophageal reflux disease 06/13/2019   History of colon polyps 06/13/2019   Mild intermittent asthma 06/13/2019   Lower back pain 01/12/2015   Neck pain 10/05/2014   Left arm pain 10/05/2014    Dysesthesia 10/05/2014   Shoulder pain, left 10/05/2014   Left knee pain 08/02/2012   Right wrist pain 02/09/2012   Left leg pain 11/17/2011   Past Medical History:  Diagnosis Date   Back pain    Chronic fatigue syndrome    Depression    High cholesterol    Hypertension    Joint pain    Joint pain    Leg pain    Liver disease    Nerves    Sleep apnea    Swelling of lower extremity    Vision abnormalities     Family History  Problem Relation Age of Onset   Hypertension Mother    Heart disease Mother    Diabetes Father    Hyperlipidemia Other  Heart attack Neg Hx    Sudden death Neg Hx     Past Surgical History:  Procedure Laterality Date   BREAST SURGERY     INCONTINENCE SURGERY     KNEE SURGERY     Social History   Occupational History   Not on file  Tobacco Use   Smoking status: Never   Smokeless tobacco: Not on file  Substance and Sexual Activity   Alcohol use: No   Drug use: No   Sexual activity: Not on file

## 2022-10-07 ENCOUNTER — Ambulatory Visit (INDEPENDENT_AMBULATORY_CARE_PROVIDER_SITE_OTHER): Payer: Medicaid Other | Admitting: Psychology

## 2022-10-07 DIAGNOSIS — F3289 Other specified depressive episodes: Secondary | ICD-10-CM | POA: Diagnosis not present

## 2022-10-07 DIAGNOSIS — F418 Other specified anxiety disorders: Secondary | ICD-10-CM | POA: Diagnosis not present

## 2022-10-07 NOTE — Progress Notes (Signed)
[facility] Counselor/Therapist Progress Note  Patient ID: Kristen Woods, MRN: [id number]    Date: 10/07/22  Time Spent: 1:04 pm - 1:45 pm : 41 Minutes  Treatment Type: Individual Therapy.  Reported Symptoms: depression and anxiety  Mental Status Exam: Appearance:  Casual     Behavior: Appropriate  Motor: Normal  Speech/Language:  Clear and Coherent  Affect: Appropriate  Mood: dysthymic  Thought process: normal  Thought content:   WNL  Sensory/Perceptual disturbances:   WNL  Orientation: oriented to person, place, time/date, and situation  Attention: Good  Concentration: Good  Memory: WNL  Fund of knowledge:  Good  Insight:   Good  Judgment:  Good  Impulse Control: Good   Risk Assessment: Danger to Self:  No Self-injurious Behavior: No Danger to Others: No Duty to Warn:no Physical Aggression / Violence:No  Access to Firearms a concern: No  Gang Involvement:No   Subjective:   Kristen Woods participated in the session,from the home via video and is aware of the limitations of teletherapy. Therapist participated from office. Kristen Woods reviewed the events of the past week. Kristen Woods noted continued frustration related to her poor health, lack of diagnosis, and lack of relief. She noted experiencing an emotional roller coaster. She noted having an upcoming appointment, in August, for a follow-up. We explored her coping during the session. Therapsit encouraged self-care and we worked on identifying areas of control and lack of control. Kristen Woods was engaged and motivated and expressed her commitment towards positive coping. Therapist validated Kristen Woods's feelings and experience. A follow-up was scheduled for continued treatment.   Interventions: CBT & interpersonal.   Diagnosis:   No diagnosis found.  Psychiatric Treatment: No , but will be referred to psychiatric provider.   Treatment Plan:  Client Abilities/Strengths Kristen Woods is intelligent, well spoken, and  expressive.   Support System: Family and friends.   Client Treatment Preferences Outpatient therapy.   Client Statement of Needs Kristen Woods would like to reduce castrophizing, improve self-talk, build and maintain positive habits, process past events and life stressors, processing the loss of mother and family dynamics after passing, processing the care-taker role of mom and lack of family support, process abandonment by family. Additional goals include bolstering coping skills, creating a healthful daily routine, engaging in consistent self-care, improve mindfulness, reduce rumination, and build a sense of self.   Treatment Level Weekly  Symptoms  Anxiety: feeling nervous, difficulty managing worry, worrying about different things, trouble relaxing.    (Status: maintained) Depression:  loss of interest, feeling down, poor sleep, lethargy, fluctuating appetite, feeling bad about self, difficulty concentrating, emotional eating.   (Status: maintained)  Goals:   Kristen Woods experiences symptoms of depression and anxiety.    Target Date: 07/29/23 Frequency: Weekly  Progress: 0 Modality: individual    Therapist will provide referrals for additional resources as appropriate.  Therapist will provide psycho-education regarding Kristen Woods's diagnosis and corresponding treatment approaches and interventions. Licensed Clinical Social Worker, [place], LCSW will support the patient's ability to achieve the goals identified. will employ CBT, BA, Problem-solving, Solution Focused, Mindfulness,  coping skills, & other evidenced-based practices will be used to promote progress towards healthy functioning to help manage decrease symptoms associated with her diagnosis.   Reduce overall level, frequency, and intensity of the feelings of depression, anxiety and panic evidenced by decreased overall symptoms from 6 to 7 days/week to 0 to 1 days/week per client report for at least 3 consecutive  months. Verbally express understanding of the relationship between feelings of depression,  anxiety and their impact on thinking patterns and behaviors. Verbalize an understanding of the role that distorted thinking plays in creating fears, excessive worry, and ruminations.  Kristen Woods participated in the creation of the treatment plan)    Delight Ovens, LCSW

## 2022-10-12 NOTE — Progress Notes (Unsigned)
[facility]  PATIENT: Kristen Woods DOB: 10-21-1965  REFERRING DOCTOR OR PCP: Georganna Skeans, MD SOURCE: Patient, notes from primary care, imaging and lab reports, MRI images personally reviewed.  _________________________________   HISTORICAL  CHIEF COMPLAINT:  No chief complaint on file.   HISTORY OF PRESENT ILLNESS:  I had the pleasure seeing your patient, Kristen Woods, at [facility] for neurologic consultation regarding her hoarseness.  She is a 57 year old obese woman (BMI 50.5) ***  Also has left leg pain   MRI of the lumbar spine 09/14/2022 showed minimal left facet hypertrophy at L3-L4 not causing spinal stenosis or nerve root compression.  At L4-L5, there is 1-2 mm anterolisthesis and moderate facet hypertrophy with joint effusions, left greater than right.  There was mild foraminal and lateral recess stenosis but no nerve root compression.  REVIEW OF SYSTEMS: Constitutional: No fevers, chills, sweats, or change in appetite Eyes: No visual changes, double vision, eye pain Ear, nose and throat: No hearing loss, ear pain, nasal congestion, sore throat Cardiovascular: No chest pain, palpitations Respiratory:  No shortness of breath at rest or with exertion.   No wheezes GastrointestinaI: No nausea, vomiting, diarrhea, abdominal pain, fecal incontinence Genitourinary:  No dysuria, urinary retention or frequency.  No nocturia. Musculoskeletal:  No neck pain, back pain Integumentary: No rash, pruritus, skin lesions Neurological: as above Psychiatric: No depression at this time.  No anxiety Endocrine: No palpitations, diaphoresis, change in appetite, change in weigh or increased thirst Hematologic/Lymphatic:  No anemia, purpura, petechiae. Allergic/Immunologic: No itchy/runny eyes, nasal congestion, recent allergic reactions, rashes  ALLERGIES: No Known Allergies  HOME MEDICATIONS:  Current Outpatient Medications:     acetaminophen-codeine (TYLENOL #3) 300-30 MG tablet, Take 1 tablet by mouth every 6 (six) hours as needed for moderate pain. (Patient not taking: Reported on 08/14/2022), Disp: 20 tablet, Rfl: 0   albuterol (VENTOLIN HFA) 108 (90 Base) MCG/ACT inhaler, Inhale 2 puffs into the lungs every 6 (six) hours as needed for wheezing or shortness of breath. (Patient not taking: Reported on 08/14/2022), Disp: 8 g, Rfl: 0   cyclobenzaprine (FLEXERIL) 10 MG tablet, Take 1 tablet (10 mg total) by mouth 2 (two) times daily as needed for muscle spasms. (Patient not taking: Reported on 08/18/2022), Disp: 40 tablet, Rfl: 0   diclofenac Sodium (VOLTAREN) 1 % GEL, Apply 2 g topically 4 (four) times daily as needed. (Patient not taking: Reported on 08/14/2022), Disp: 100 g, Rfl: 0   diltiazem (CARDIZEM LA) 180 MG 24 hr tablet, Take 1 tablet (180 mg total) by mouth daily. (Patient not taking: Reported on 08/18/2022), Disp: 90 tablet, Rfl: 0   ibuprofen (ADVIL) 800 MG tablet, Take 800 mg by mouth 3 (three) times daily. (Patient not taking: Reported on 08/18/2022), Disp: , Rfl:    lisinopril-hydrochlorothiazide (ZESTORETIC) 20-25 MG tablet, Take 1 tablet by mouth daily., Disp: 90 tablet, Rfl: 1   montelukast (SINGULAIR) 10 MG tablet, Take 10 mg by mouth at bedtime., Disp: , Rfl:    omeprazole (PRILOSEC) 40 MG capsule, Take 1 capsule (40 mg total) by mouth daily., Disp: 90 capsule, Rfl: 3   promethazine-dextromethorphan (PROMETHAZINE-DM) 6.25-15 MG/5ML syrup, Take 5 mLs by mouth 4 (four) times daily as needed for cough. (Patient not taking: Reported on 08/14/2022), Disp: 180 mL, Rfl: 0   Spacer/Aero-Holding Chambers DEVI, 1 Units by Does not apply route in the morning, at noon, and at bedtime. (Patient not taking: Reported on 08/14/2022), Disp: 1 Units, Rfl: 0  PAST MEDICAL  HISTORY: Past Medical History:  Diagnosis Date   Back pain    Chronic fatigue syndrome    Depression    High cholesterol    Hypertension    Joint pain     Joint pain    Leg pain    Liver disease    Nerves    Sleep apnea    Swelling of lower extremity    Vision abnormalities     PAST SURGICAL HISTORY: Past Surgical History:  Procedure Laterality Date   BREAST SURGERY     INCONTINENCE SURGERY     KNEE SURGERY      FAMILY HISTORY: Family History  Problem Relation Age of Onset   Hypertension Mother    Heart disease Mother    Diabetes Father    Hyperlipidemia Other    Heart attack Neg Hx    Sudden death Neg Hx     SOCIAL HISTORY: Social History   Socioeconomic History   Marital status: Single    Spouse name: Not on file   Number of children: Not on file   Years of education: Not on file   Highest education level: Associate degree: occupational, Scientist, product/process development, or vocational program  Occupational History   Not on file  Tobacco Use   Smoking status: Never   Smokeless tobacco: Not on file  Substance and Sexual Activity   Alcohol use: No   Drug use: No   Sexual activity: Not on file  Other Topics Concern   Not on file  Social History Narrative   Not on file   Social Determinants of Health   Financial Resource Strain: Medium Risk (08/18/2022)   Overall Financial Resource Strain (CARDIA)    Difficulty of Paying Living Expenses: Somewhat hard  Food Insecurity: Food Insecurity Present (08/18/2022)   Hunger Vital Sign    Worried About Running Out of Food in the Last Year: Sometimes true    Ran Out of Food in the Last Year: Not on file  Transportation Needs: No Transportation Needs (08/18/2022)   PRAPARE - Administrator, Civil Service (Medical): No    Lack of Transportation (Non-Medical): No  Physical Activity: Insufficiently Active (08/18/2022)   Exercise Vital Sign    Days of Exercise per Week: 2 days    Minutes of Exercise per Session: 10 min  Stress: Stress Concern Present (08/18/2022)   [employer] of Occupational Health - Occupational Stress Questionnaire    Feeling of Stress : To some extent   Social Connections: Socially Isolated (08/18/2022)   Social Connection and Isolation Panel [NHANES]    Frequency of Communication with Friends and Family: Once a week    Frequency of Social Gatherings with Friends and Family: Never    Attends Religious Services: 1 to 4 times per year    Active Member of [employer] or Organizations: No    Attends Engineer, structural: Not on file    Marital Status: Never married  Intimate Partner Violence: Not on file       PHYSICAL EXAM  There were no vitals filed for this visit.  There is no height or weight on file to calculate BMI.   General: The patient is well-developed and well-nourished and in no acute distress  HEENT:  Head is [place]/AT.  Sclera are anicteric.  Funduscopic exam shows normal optic discs and retinal vessels.  Neck: No carotid bruits are noted.  The neck is nontender.  Cardiovascular: The heart has a regular rate and rhythm with a normal  S1 and S2. There were no murmurs, gallops or rubs.    Skin: Extremities are without rash or  edema.  Musculoskeletal:  Back is nontender  Neurologic Exam  Mental status: The patient is alert and oriented x 3 at the time of the examination. The patient has apparent normal recent and remote memory, with an apparently normal attention span and concentration ability.   Speech is normal.  Cranial nerves: Extraocular movements are full. Pupils are equal, round, and reactive to light and accomodation.  Visual fields are full.  Facial symmetry is present. There is good facial sensation to soft touch bilaterally.Facial strength is normal.  Trapezius and sternocleidomastoid strength is normal. No dysarthria is noted.  The tongue is midline, and the patient has symmetric elevation of the soft palate. No obvious hearing deficits are noted.  Motor:  Muscle bulk is normal.   Tone is normal. Strength is  5 / 5 in all 4 extremities.   Sensory: Sensory testing is intact to pinprick, soft touch and  vibration sensation in all 4 extremities.  Coordination: Cerebellar testing reveals good finger-nose-finger and heel-to-shin bilaterally.  Gait and station: Station is normal.   Gait is normal. Tandem gait is normal. Romberg is negative.   Reflexes: Deep tendon reflexes are symmetric and normal bilaterally.   Plantar responses are flexor.    DIAGNOSTIC DATA (LABS, IMAGING, TESTING) - I reviewed patient records, labs, notes, testing and imaging myself where available.  Lab Results  Component Value Date   WBC 6.3 08/14/2022   HGB 13.0 08/14/2022   HCT 41.0 08/14/2022   MCV 89.9 08/14/2022   PLT 408 (H) 08/14/2022      Component Value Date/Time   NA 137 08/14/2022 0840   NA 141 07/15/2022 1023   K 3.6 08/14/2022 0840   CL 101 08/14/2022 0840   CO2 26 08/14/2022 0840   GLUCOSE 116 (H) 08/14/2022 0840   BUN 12 08/14/2022 0840   BUN 14 07/15/2022 1023   CREATININE 1.22 (H) 08/14/2022 0840   CREATININE 1.04 (H) 09/25/2021 1315   CALCIUM 9.1 08/14/2022 0840   PROT 7.1 08/14/2022 0840   PROT 6.6 07/15/2022 1023   ALBUMIN 3.5 08/14/2022 0840   ALBUMIN 4.0 07/15/2022 1023   AST 17 08/14/2022 0840   ALT 16 08/14/2022 0840   ALKPHOS 90 08/14/2022 0840   BILITOT 0.8 08/14/2022 0840   BILITOT 0.3 07/15/2022 1023   GFRNONAA 52 (L) 08/14/2022 0840   GFRAA 50 (L) 09/13/2015 1734   Lab Results  Component Value Date   CHOL 218 (H) 07/15/2022   HDL 68 07/15/2022   LDLCALC 139 (H) 07/15/2022   TRIG 60 07/15/2022   CHOLHDL 3.2 07/15/2022   Lab Results  Component Value Date   HGBA1C 6.0 (H) 06/12/2022   Lab Results  Component Value Date   VITAMINB12 784 06/12/2022   Lab Results  Component Value Date   TSH 1.300 07/15/2022       ASSESSMENT AND PLAN  ***   Riot Waterworth A. Epimenio Foot, MD, [facility] 10/12/2022, 6:04 PM Certified in Neurology, Clinical Neurophysiology, Sleep Medicine and Neuroimaging  [facility] [address] [place], [place]  [address] [phone]

## 2022-10-15 ENCOUNTER — Encounter: Payer: Self-pay | Admitting: Neurology

## 2022-10-15 ENCOUNTER — Ambulatory Visit (INDEPENDENT_AMBULATORY_CARE_PROVIDER_SITE_OTHER): Payer: Medicaid Other | Admitting: Neurology

## 2022-10-15 VITALS — BP 134/82 | HR 99 | Ht 66.0 in | Wt 313.0 lb

## 2022-10-15 DIAGNOSIS — G894 Chronic pain syndrome: Secondary | ICD-10-CM

## 2022-10-15 DIAGNOSIS — M797 Fibromyalgia: Secondary | ICD-10-CM

## 2022-10-15 DIAGNOSIS — M5432 Sciatica, left side: Secondary | ICD-10-CM

## 2022-10-15 DIAGNOSIS — R490 Dysphonia: Secondary | ICD-10-CM

## 2022-10-15 DIAGNOSIS — F32A Depression, unspecified: Secondary | ICD-10-CM

## 2022-10-15 MED ORDER — DULOXETINE HCL 60 MG PO CPEP: 60.0000 mg | ORAL_CAPSULE | Freq: Every day | ORAL | 3 refills | Status: DC

## 2022-10-22 ENCOUNTER — Encounter (INDEPENDENT_AMBULATORY_CARE_PROVIDER_SITE_OTHER): Payer: Self-pay | Admitting: Family Medicine

## 2022-10-22 ENCOUNTER — Telehealth (INDEPENDENT_AMBULATORY_CARE_PROVIDER_SITE_OTHER): Payer: Medicaid Other | Admitting: Family Medicine

## 2022-10-22 ENCOUNTER — Ambulatory Visit: Payer: Commercial Managed Care - HMO | Admitting: Psychology

## 2022-10-22 ENCOUNTER — Telehealth: Payer: Self-pay

## 2022-10-22 DIAGNOSIS — Z5919 Other inadequate housing: Secondary | ICD-10-CM

## 2022-10-22 DIAGNOSIS — R7303 Prediabetes: Secondary | ICD-10-CM

## 2022-10-22 DIAGNOSIS — Z6841 Body Mass Index (BMI) 40.0 and over, adult: Secondary | ICD-10-CM | POA: Diagnosis not present

## 2022-10-22 NOTE — Progress Notes (Signed)
TeleHealth Visit:  This visit was completed with telemedicine (audio/video) technology. Kristen Woods has verbally consented to this TeleHealth visit. The patient is located at home, the provider is located at home. The participants in this visit include the listed provider and patient. The visit was conducted today via phone call.  Unsuccessful attempt was made to connect to video. Length of call was 21 minutes.   OBESITY Kristen Woods is here to discuss her progress with her obesity treatment plan along with follow-up of her obesity related diagnoses.   Today's visit was # 9 Starting weight: 300 lbs Starting date: 02/04/22 Weight at last in office visit: 306 lbs on 08/25/22 Total weight loss: 0 lbs at last in office visit on 08/25/22. Today's reported weight (10/22/22):  309 lbs  Nutrition Plan: the Category 3 plan - 0% adherence.  Current exercise: none   Interim History:  Reports issues at her brother's home with roaches.  Any food that she cooks is getting contaminated before she can eat it.  Microwave has bugs in it and she is unable to use it. She reports binging on snack foods rather that eating healthy foods.  She is concerned because she has gained weight.  She is doing a good job with drinking water.  Has mobility issues due to sciatica and osteoarthritis. 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  She has been seeing specialists trying to find a cause and has an appointment scheduled with another specialist.  She says she is overwhelmed by her health issues and living situation.  There is a possibility she might be able to go live with her daughter in September but she is unsure yet.    Patient was tearful during phone call.  When asked she denied suicidal/homicidal ideation.  Assessment/Plan:  1. Housing Unsatisfactory Kristen Woods is living with her brother and says she would be homeless if not for that. The house is crawling with roaches  and food that she tries to cook gets contaminated before she can eat it. She called senior resources but they did not have any housing available.  Plan: Encouraged be to ask her church if any help is available. Called [facility] Care Management.  She is not eligible for their help.  They provided a number for Managed Medicaid-Alexis Shields-[phone].  Called and left message. Sent the patient a MyChart message letting her know that I have asked for return call from Managed Medicaid liaison.  2. Prediabetes Last A1c was 6.0.  Medication(s): None Patient was asking if she could possibly get Ozempic.  She said she has been on this in the past and lost 25 pounds.  Advised her that Medicaid will not cover this because she does not have diabetes. Lab Results  Component Value Date   HGBA1C 6.0 (H) 06/12/2022   HGBA1C 6.0 (H) 02/04/2022   Lab Results  Component Value Date   INSULIN 13.4 02/04/2022    Plan: Discussed adding metformin next visit due to A1c of 6.0. Discussed several high protein foods patient could eat that do not require much prep.   3. Morbid Obesity: Current BMI 49  Kristen Woods is currently in the action stage of change. As such, her goal is to continue with weight loss efforts.  She has agreed to practicing portion control and making smarter food choices, such as increasing vegetables and decreasing simple carbohydrates.  She will focus on high-protein foods that require little to no meal prep such as cottage cheese, yogurt, tuna,  canned chicken, cheese sticks, sandwiches, etc.  Exercise goals: No exercise has been prescribed at this time.  Behavioral modification strategies: increasing lean protein intake, decreasing simple carbohydrates , meal planning , and planning for success.  Kristen Woods has agreed to follow-up with our clinic in 2 weeks.  No orders of the defined types were placed in this encounter.   There are no discontinued medications.   No orders of the  defined types were placed in this encounter.     Objective:   VITALS: Per patient if applicable, see vitals. GENERAL: Alert and in no acute distress. CARDIOPULMONARY: No increased WOB. Speaking in clear sentences.  PSYCH: Pleasant and cooperative. Speech normal rate and rhythm. Affect is appropriate. Insight and judgement are appropriate. Attention is focused, linear, and appropriate.  NEURO: Oriented as arrived to appointment on time with no prompting.   Attestation Statements:   Reviewed by clinician on day of visit: allergies, medications, problem list, medical history, surgical history, family history, social history, and previous encounter notes.  This was prepared with the assistance of Engineer, civil (consulting).  Occasional wrong-word or sound-a-like substitutions may have occurred due to the inherent limitations of voice recognition software.

## 2022-10-22 NOTE — Patient Outreach (Signed)
BSW received a telephone call from [facility] with some concerns for patient. BSW made an attempt and left a voicemail.   Medicaid Managed Care   Unsuccessful Outreach Note  10/22/2022 Name: Preksha Tempel MRN: [id number] DOB: 1965-10-29  Referred by: Georganna Skeans, MD Reason for referral : High Risk Managed Medicaid ([facility] Social work unsuccessful telephone outreach )   An unsuccessful telephone outreach was attempted today. The patient was referred to the case management team for assistance with care management and care coordination.   Follow Up Plan: A HIPAA compliant phone message was left for the patient providing contact information and requesting a return call.   Abelino Derrick, MHA [facility]  Managed [facility] Social Worker [phone]

## 2022-10-28 ENCOUNTER — Ambulatory Visit: Payer: Medicaid Other | Admitting: Psychology

## 2022-10-28 DIAGNOSIS — F418 Other specified anxiety disorders: Secondary | ICD-10-CM | POA: Diagnosis not present

## 2022-10-28 DIAGNOSIS — F3289 Other specified depressive episodes: Secondary | ICD-10-CM | POA: Diagnosis not present

## 2022-10-28 NOTE — Progress Notes (Signed)
[facility] Counselor/Therapist Progress Note  Patient ID: Kristen Woods, MRN: [id number]    Date: 10/28/22  Time Spent: 1:04 pm - 1:42 pm : 38 Minutes  Treatment Type: Individual Therapy.  Reported Symptoms: depression and anxiety  Mental Status Exam: Appearance:  Casual     Behavior: Appropriate  Motor: Normal  Speech/Language:  Clear and Coherent  Affect: Appropriate  Mood: dysthymic  Thought process: normal  Thought content:   WNL  Sensory/Perceptual disturbances:   WNL  Orientation: oriented to person, place, time/date, and situation  Attention: Good  Concentration: Good  Memory: WNL  Fund of knowledge:  Good  Insight:   Good  Judgment:  Good  Impulse Control: Good   Risk Assessment: Danger to Self:  No Self-injurious Behavior: No Danger to Others: No Duty to Warn:no Physical Aggression / Violence:No  Access to Firearms a concern: No  Gang Involvement:No   Subjective:   Kristen Woods participated in the session,from the home via video and is aware of the limitations of teletherapy. Therapist participated from office. Kristen Woods reviewed the events of the past week. She noted enacting boundaries with her daughter who lives in [place] and noted that she will not "chase" her any longer and is hopeful that her daughter will "grow up". She noted "crying" over this decision but noted a lack of control over her daughter's actions. She noted working on eating more healthfully within the confines of her current living situation. She noted continued struggles with her ability to talk and a lack of diagnosis and treatment for this issue. She is slated to meet with a specialist at the end of August. She noted working on trying to be positive despite her health and pain issues. She noted the transitions that will accompany her move [place] later this year. She noted working on identifying areas of control and lack of control and to think positively. Therapist  encouraged Kristen Woods to continue with her efforts to think positively, identify areas of control, and to identify areas of gratitude. Therapist praised Kristen Woods for her effort and provided supportive therapy. A follow-up was scheduled for continued treatment.   Interventions: CBT & interpersonal.   Diagnosis:   Other specified anxiety disorders  Other depression  Psychiatric Treatment: No , but will be referred to psychiatric provider.   Treatment Plan:  Client Abilities/Strengths Kristen Woods is intelligent, well spoken, and expressive.   Support System: Family and friends.   Client Treatment Preferences Outpatient therapy.   Client Statement of Needs Kristen Woods would like to reduce castrophizing, improve self-talk, build and maintain positive habits, process past events and life stressors, processing the loss of mother and family dynamics after passing, processing the care-taker role of mom and lack of family support, process abandonment by family. Additional goals include bolstering coping skills, creating a healthful daily routine, engaging in consistent self-care, improve mindfulness, reduce rumination, and build a sense of self.   Treatment Level Weekly  Symptoms  Anxiety: feeling nervous, difficulty managing worry, worrying about different things, trouble relaxing.    (Status: maintained) Depression:  loss of interest, feeling down, poor sleep, lethargy, fluctuating appetite, feeling bad about self, difficulty concentrating, emotional eating.   (Status: maintained)  Goals:   Kristen Woods experiences symptoms of depression and anxiety.    Target Date: 07/29/23 Frequency: Weekly  Progress: 0 Modality: individual    Therapist will provide referrals for additional resources as appropriate.  Therapist will provide psycho-education regarding Kristen Woods's diagnosis and corresponding treatment approaches and interventions. Licensed Visual merchandiser,  Kristen Ovens, LCSW will support the  patient's ability to achieve the goals identified. will employ CBT, BA, Problem-solving, Solution Focused, Mindfulness,  coping skills, & other evidenced-based practices will be used to promote progress towards healthy functioning to help manage decrease symptoms associated with her diagnosis.   Reduce overall level, frequency, and intensity of the feelings of depression, anxiety and panic evidenced by decreased overall symptoms from 6 to 7 days/week to 0 to 1 days/week per client report for at least 3 consecutive months. Verbally express understanding of the relationship between feelings of depression, anxiety and their impact on thinking patterns and behaviors. Verbalize an understanding of the role that distorted thinking plays in creating fears, excessive worry, and ruminations.  Kristen Woods participated in the creation of the treatment plan)    Kristen Ovens, LCSW

## 2022-11-04 ENCOUNTER — Ambulatory Visit: Payer: Commercial Managed Care - HMO | Admitting: Podiatry

## 2022-11-06 ENCOUNTER — Ambulatory Visit: Payer: Medicaid Other | Admitting: Nurse Practitioner

## 2022-11-11 ENCOUNTER — Ambulatory Visit: Payer: Medicaid Other | Admitting: Psychology

## 2022-11-17 ENCOUNTER — Ambulatory Visit: Payer: Commercial Managed Care - HMO | Admitting: Family Medicine

## 2022-11-24 ENCOUNTER — Ambulatory Visit: Payer: Medicaid Other | Admitting: Psychology

## 2022-12-21 ENCOUNTER — Other Ambulatory Visit: Payer: Self-pay | Admitting: Family Medicine

## 2022-12-21 DIAGNOSIS — I10 Essential (primary) hypertension: Secondary | ICD-10-CM

## 2023-01-01 ENCOUNTER — Ambulatory Visit: Payer: Medicaid Other

## 2023-01-01 ENCOUNTER — Ambulatory Visit
Admission: EM | Admit: 2023-01-01 | Discharge: 2023-01-01 | Disposition: A | Discharge status: Home / Self Care | Payer: Medicaid Other | Attending: Internal Medicine | Admitting: Internal Medicine

## 2023-01-01 ENCOUNTER — Ambulatory Visit: Payer: Medicaid Other | Admitting: Orthopedic Surgery

## 2023-01-01 DIAGNOSIS — M79671 Pain in right foot: Secondary | ICD-10-CM | POA: Diagnosis not present

## 2023-01-01 DIAGNOSIS — M109 Gout, unspecified: Secondary | ICD-10-CM | POA: Diagnosis not present

## 2023-01-01 MED ORDER — PREDNISONE 20 MG PO TABS: 40.0000 mg | ORAL_TABLET | Freq: Every day | ORAL | 0 refills | Status: AC

## 2023-01-01 NOTE — ED Triage Notes (Signed)
Right great toe injury. X1 day

## 2023-01-01 NOTE — Discharge Instructions (Signed)
Will call with x-ray results.  Suspect gout.  Blood work pending.  I have prescribed prednisone to help symptoms.  Follow-up if symptoms persist or worsen.

## 2023-01-01 NOTE — ED Provider Notes (Signed)
[facility]    CSN: [id number] Arrival date & time: 01/01/23  1020      History   Chief Complaint Chief Complaint  Patient presents with   Toe Injury    HPI Mais Marban is a 57 y.o. female.   Patient presents with right great toe pain that started this morning upon awakening.  She denies any injury to the area.  Denies any history of chronic toe pain.  Denies eating any high purine foods recently.  Patient has not taken any medication for pain. Denies any fever.     Past Medical History:  Diagnosis Date   Back pain    Chronic fatigue syndrome    Depression    High cholesterol    Hypertension    Joint pain    Joint pain    Leg pain    Liver disease    Nerves    Sleep apnea    Swelling of lower extremity    Vision abnormalities     Patient Active Problem List   Diagnosis Date Noted   Dysphonia 06/06/2022   Hoarseness 06/06/2022   Eating disorder 05/22/2022   Pre-diabetes 02/06/2022   Other fatigue 02/04/2022   SOB (shortness of breath) on exertion 02/04/2022   Essential hypertension 02/04/2022   Health care maintenance 02/04/2022   Other hyperlipidemia 02/04/2022   Elevated glucose 02/04/2022   Vitamin D deficiency 02/04/2022   Morbid obesity (HCC) 02/04/2022   Unilateral primary osteoarthritis, right knee 12/16/2021   History of total left knee replacement 12/16/2021   Obstructive sleep apnea syndrome 03/05/2021   Stress incontinence of urine 03/05/2021   Mixed hyperlipidemia 09/01/2019   Obesity, Class III, BMI 40-49.9 (morbid obesity) (HCC) 09/01/2019   Chronic anemia 06/13/2019   DDD (degenerative disc disease), cervical 06/13/2019   DDD (degenerative disc disease), lumbosacral 06/13/2019   Peripheral edema 06/13/2019   Gastroesophageal reflux disease 06/13/2019   History of colon polyps 06/13/2019   Mild intermittent asthma 06/13/2019   Lower back pain 01/12/2015   Neck pain 10/05/2014   Left arm pain 10/05/2014   Dysesthesia  10/05/2014   Shoulder pain, left 10/05/2014   Left knee pain 08/02/2012   Right wrist pain 02/09/2012   Left leg pain 11/17/2011    Past Surgical History:  Procedure Laterality Date   BREAST SURGERY     INCONTINENCE SURGERY     KNEE SURGERY      OB History     Gravida  6   Para      Term      Preterm      AB      Living         SAB      IAB      Ectopic      Multiple      Live Births               Home Medications    Prior to Admission medications   Medication Sig Start Date End Date Taking? Authorizing Provider  lisinopril-hydrochlorothiazide (ZESTORETIC) 20-25 MG tablet Take 1 tablet by mouth once daily 12/23/22  Yes Georganna Skeans, MD  montelukast (SINGULAIR) 10 MG tablet Take 10 mg by mouth at bedtime. 08/29/22  Yes [provider]  omeprazole (PRILOSEC) 40 MG capsule Take 1 capsule (40 mg total) by mouth daily. 06/30/22  Yes Georganna Skeans, MD  predniSONE (DELTASONE) 20 MG tablet Take 2 tablets (40 mg total) by mouth daily for 5 days. 01/01/23 01/06/23  Yes Shawnetta Lein, [place], [place]  acetaminophen-codeine (TYLENOL #3) 300-30 MG tablet Take 1 tablet by mouth every 6 (six) hours as needed for moderate pain. Patient not taking: Reported on 08/14/2022 08/13/22   Kirtland Bouchard, PA-C  albuterol (VENTOLIN HFA) 108 (90 Base) MCG/ACT inhaler Inhale 2 puffs into the lungs every 6 (six) hours as needed for wheezing or shortness of breath. Patient not taking: Reported on 08/14/2022 05/21/22   Ellsworth Lennox, PA-C  cyclobenzaprine (FLEXERIL) 10 MG tablet Take 1 tablet (10 mg total) by mouth 2 (two) times daily as needed for muscle spasms. Patient not taking: Reported on 08/18/2022 08/13/22   Kirtland Bouchard, PA-C  diclofenac Sodium (VOLTAREN) 1 % GEL Apply 2 g topically 4 (four) times daily as needed. Patient not taking: Reported on 08/14/2022 03/20/22   Long, Arlyss Repress, MD  diltiazem (CARDIZEM LA) 180 MG 24 hr tablet Take 1 tablet (180 mg total) by mouth daily. Patient  not taking: Reported on 08/18/2022 07/18/22   Georganna Skeans, MD  DULoxetine (CYMBALTA) 60 MG capsule Take 1 capsule (60 mg total) by mouth daily. 10/15/22   Sater, Pearletha Furl, MD  ibuprofen (ADVIL) 800 MG tablet Take 800 mg by mouth 3 (three) times daily. Patient not taking: Reported on 08/18/2022 04/03/22   [provider]  promethazine-dextromethorphan (PROMETHAZINE-DM) 6.25-15 MG/5ML syrup Take 5 mLs by mouth 4 (four) times daily as needed for cough. Patient not taking: Reported on 08/14/2022 05/23/22   Cecil Cobbs, PA-C  Spacer/Aero-Holding Chambers DEVI 1 Units by Does not apply route in the morning, at noon, and at bedtime. Patient not taking: Reported on 08/14/2022 05/21/22   Ellsworth Lennox, PA-C    Family History Family History  Problem Relation Age of Onset   Hypertension Mother    Heart disease Mother    Diabetes Father    Hyperlipidemia Other    Heart attack Neg Hx    Sudden death Neg Hx     Social History Social History   Tobacco Use   Smoking status: Never  Vaping Use   Vaping status: Never Used  Substance Use Topics   Alcohol use: No   Drug use: No     Allergies   Patient has no known allergies.   Review of Systems Review of Systems Per HPI  Physical Exam Triage Vital Signs ED Triage Vitals  Encounter Vitals Group     BP 01/01/23 1215 (!) 163/99     Systolic BP Percentile --      Diastolic BP Percentile --      Pulse Rate 01/01/23 1213 93     Resp 01/01/23 1213 18     Temp 01/01/23 1213 98.2 F (36.8 C)     Temp Source 01/01/23 1213 Oral     SpO2 01/01/23 1213 95 %     Weight 01/01/23 1215 (!) 309 lb (140.2 kg)     Height 01/01/23 1215 5\' 6"  (1.676 m)     Head Circumference --      Peak Flow --      Pain Score 01/01/23 1213 10     Pain Loc --      Pain Education --      Exclude from Growth Chart --    No data found.  Updated Vital Signs BP (!) 163/99 (BP Location: Right Arm)   Pulse 93   Temp 98.2 F (36.8 C) (Oral)   Resp 18    Ht 5\' 6"  (1.676 m)   Wt Marland Kitchen)  309 lb (140.2 kg)   LMP 01/04/2015   SpO2 95%   BMI 49.87 kg/m   Visual Acuity Right Eye Distance:   Left Eye Distance:   Bilateral Distance:    Right Eye Near:   Left Eye Near:    Bilateral Near:     Physical Exam Constitutional:      General: She is not in acute distress.    Appearance: Normal appearance. She is not toxic-appearing or diaphoretic.  HENT:     Head: Normocephalic and atraumatic.  Eyes:     Extraocular Movements: Extraocular movements intact.     Conjunctiva/sclera: Conjunctivae normal.  Pulmonary:     Effort: Pulmonary effort is normal.  Feet:     Comments: Has tenderness to palpation with mild swelling and erythema present to MTP joint of right great toe.  Patient can wiggle toes.  No other tenderness to palpation to foot.  Capillary refill and pulses intact. Neurological:     General: No focal deficit present.     Mental Status: She is alert and oriented to person, place, and time. Mental status is at baseline.  Psychiatric:        Mood and Affect: Mood normal.        Behavior: Behavior normal.        Thought Content: Thought content normal.        Judgment: Judgment normal.      UC Treatments / Results  Labs (all labs ordered are listed, but only abnormal results are displayed) Labs Reviewed  URIC ACID    EKG   Radiology DG Foot Complete Right  Result Date: 01/01/2023 CLINICAL DATA:  Pain in great toe EXAM: RIGHT FOOT COMPLETE - 3+ VIEW COMPARISON:  None Available. FINDINGS: Suspected mild soft tissue swelling medial to the first metatarsal head. No underlying bony abnormality. No malalignment. Small plantar calcaneal spur. No foreign body or acute bony findings. IMPRESSION: 1. Mild soft tissue swelling medial to the first metatarsal head. 2. Small plantar calcaneal spur. Electronically Signed   By: Gaylyn Rong M.D.   On: 01/01/2023 13:32    Procedures Procedures (including critical care  time)  Medications Ordered in UC Medications - No data to display  Initial Impression / Assessment and Plan / UC Course  I have reviewed the triage vital signs and the nursing notes.  Pertinent labs & imaging results that were available during my care of the patient were reviewed by me and considered in my medical decision making (see chart for details).     X-ray negative for any acute bony abnormality.  I suspect gout given physical exam.  No concern for septic joint given x-ray findings.  Uric acid level pending.  Will treat with prednisone steroid as patient has taken this before and tolerated well.  Advised her to follow-up if symptoms persist or worsen.  Patient verbalized understanding and was agreeable with plan.  Attempted to call patient to notify of x-ray results but there was no answer.  Unable to leave voicemail. Final Clinical Impressions(s) / UC Diagnoses   Final diagnoses:  Acute gout involving toe of right foot, unspecified cause     Discharge Instructions      Will call with x-ray results.  Suspect gout.  Blood work pending.  I have prescribed prednisone to help symptoms.  Follow-up if symptoms persist or worsen.     ED Prescriptions     Medication Sig Dispense Auth. Provider   predniSONE (DELTASONE) 20 MG tablet Take 2  tablets (40 mg total) by mouth daily for 5 days. 10 tablet Gustavus Bryant, [place]      PDMP not reviewed this encounter.   Gustavus Bryant, [place] 01/01/23 1400

## 2023-01-02 ENCOUNTER — Ambulatory Visit (INDEPENDENT_AMBULATORY_CARE_PROVIDER_SITE_OTHER): Payer: Medicaid Other | Admitting: Family Medicine

## 2023-01-02 ENCOUNTER — Encounter: Payer: Self-pay | Admitting: Family Medicine

## 2023-01-02 VITALS — BP 134/95 | HR 76 | Temp 98.7°F | Resp 18 | Ht 66.0 in | Wt 309.4 lb

## 2023-01-02 DIAGNOSIS — M109 Gout, unspecified: Secondary | ICD-10-CM

## 2023-01-02 DIAGNOSIS — K219 Gastro-esophageal reflux disease without esophagitis: Secondary | ICD-10-CM

## 2023-01-02 DIAGNOSIS — F419 Anxiety disorder, unspecified: Secondary | ICD-10-CM

## 2023-01-02 DIAGNOSIS — R3915 Urgency of urination: Secondary | ICD-10-CM

## 2023-01-02 DIAGNOSIS — R490 Dysphonia: Secondary | ICD-10-CM | POA: Diagnosis not present

## 2023-01-02 DIAGNOSIS — F32A Depression, unspecified: Secondary | ICD-10-CM

## 2023-01-02 DIAGNOSIS — I10 Essential (primary) hypertension: Secondary | ICD-10-CM

## 2023-01-02 DIAGNOSIS — R339 Retention of urine, unspecified: Secondary | ICD-10-CM

## 2023-01-02 DIAGNOSIS — Z6841 Body Mass Index (BMI) 40.0 and over, adult: Secondary | ICD-10-CM

## 2023-01-02 LAB — URIC ACID: Uric Acid: 7.9 mg/dL — ABNORMAL HIGH (ref 3.0–7.2)

## 2023-01-02 MED ORDER — LISINOPRIL-HYDROCHLOROTHIAZIDE 20-25 MG PO TABS: 1.0000 | ORAL_TABLET | Freq: Every day | ORAL | 0 refills | Status: AC

## 2023-01-02 MED ORDER — DULOXETINE HCL 60 MG PO CPEP: 60.0000 mg | ORAL_CAPSULE | Freq: Every day | ORAL | 3 refills | Status: AC

## 2023-01-02 MED ORDER — IBUPROFEN 800 MG PO TABS: 800.0000 mg | ORAL_TABLET | Freq: Three times a day (TID) | ORAL | 1 refills | Status: AC

## 2023-01-02 MED ORDER — DILTIAZEM HCL ER 180 MG PO TB24: 180.0000 mg | ORAL_TABLET | Freq: Every day | ORAL | 0 refills | Status: AC

## 2023-01-02 MED ORDER — OMEPRAZOLE 40 MG PO CPDR: 40.0000 mg | DELAYED_RELEASE_CAPSULE | Freq: Every day | ORAL | 3 refills | Status: AC

## 2023-01-05 ENCOUNTER — Ambulatory Visit: Payer: Medicaid Other | Admitting: Family Medicine

## 2023-01-05 ENCOUNTER — Encounter: Payer: Self-pay | Admitting: Family Medicine

## 2023-01-05 VITALS — BP 135/88 | Ht 66.0 in | Wt 301.0 lb

## 2023-01-05 DIAGNOSIS — M1712 Unilateral primary osteoarthritis, left knee: Secondary | ICD-10-CM | POA: Diagnosis present

## 2023-01-05 NOTE — Patient Instructions (Signed)
Your pain is due to arthritis. These are the different medications you can take for this: Tylenol 500mg  1-2 tabs three times a day for pain. Capsaicin, aspercreme, or biofreeze topically up to four times a day may also help with pain. Some supplements that may help for arthritis: Boswellia extract, curcumin, pycnogenol Aleve 1-2 tabs twice a day with food - be careful because this medication will increase your blood pressure, can affect your kidneys. Cortisone injections are an option but haven't helped you in the past, are more risky when you have a replacement. It's important that you continue to stay active. Straight leg raises, knee extensions 3 sets of 10 once a day (add ankle weight if these become too easy). Consider physical therapy to strengthen muscles around the joint that hurts to take pressure off of the joint itself. Shoe inserts with good arch support may be helpful. Heat or ice 15 minutes at a time 3-4 times a day as needed to help with pain. Water aerobics and cycling with low resistance are the best two types of exercise for arthritis though any exercise is ok as long as it doesn't worsen the pain. Follow up with me as needed.

## 2023-01-05 NOTE — Progress Notes (Unsigned)
PCP: Georganna Skeans, MD  Subjective:   HPI: Patient is a 57 y.o. female here for left knee pain and swelling.  Chronic intermittent swelling and pain of left knee. Hx of partial knee replacement.  Reportedly had knee replaced in [place] over 10 years ago.  Per review of left knee plain films, patient had full knee replacement excluding patellar padding.  Reports she has previously had both gel and steroid injections with no relief.  Pain is worse with activity.  Has been seeing [facility], and has been told that she must lose weight before discussing surgical intervention.  Is seeing a nutritionist.  Denies any new trauma.  No numbness or tingling.  Pain does not radiate.  Went to urgent care 4 days ago for gout of right first MTP.  Was started on prednisone.  Feels that the prednisone has improved her right toe, however has not improved her knee.  Past Medical History:  Diagnosis Date   Back pain    Chronic fatigue syndrome    Depression    High cholesterol    Hypertension    Joint pain    Joint pain    Leg pain    Liver disease    Nerves    Sleep apnea    Swelling of lower extremity    Vision abnormalities     Current Outpatient Medications on File Prior to Visit  Medication Sig Dispense Refill   acetaminophen-codeine (TYLENOL #3) 300-30 MG tablet Take 1 tablet by mouth every 6 (six) hours as needed for moderate pain. 20 tablet 0   albuterol (VENTOLIN HFA) 108 (90 Base) MCG/ACT inhaler Inhale 2 puffs into the lungs every 6 (six) hours as needed for wheezing or shortness of breath. 8 g 0   cyclobenzaprine (FLEXERIL) 10 MG tablet Take 1 tablet (10 mg total) by mouth 2 (two) times daily as needed for muscle spasms. 40 tablet 0   diclofenac Sodium (VOLTAREN) 1 % GEL Apply 2 g topically 4 (four) times daily as needed. 100 g 0   diltiazem (CARDIZEM LA) 180 MG 24 hr tablet Take 1 tablet (180 mg total) by mouth daily. 90 tablet 0   DULoxetine (CYMBALTA) 60 MG capsule Take 1  capsule (60 mg total) by mouth daily. 90 capsule 3   ibuprofen (ADVIL) 800 MG tablet Take 1 tablet (800 mg total) by mouth 3 (three) times daily. 60 tablet 1   lisinopril-hydrochlorothiazide (ZESTORETIC) 20-25 MG tablet Take 1 tablet by mouth daily. 90 tablet 0   montelukast (SINGULAIR) 10 MG tablet Take 10 mg by mouth at bedtime.     omeprazole (PRILOSEC) 40 MG capsule Take 1 capsule (40 mg total) by mouth daily. 90 capsule 3   predniSONE (DELTASONE) 20 MG tablet Take 2 tablets (40 mg total) by mouth daily for 5 days. 10 tablet 0   promethazine-dextromethorphan (PROMETHAZINE-DM) 6.25-15 MG/5ML syrup Take 5 mLs by mouth 4 (four) times daily as needed for cough. 180 mL 0   Spacer/Aero-Holding Chambers DEVI 1 Units by Does not apply route in the morning, at noon, and at bedtime. 1 Units 0   No current facility-administered medications on file prior to visit.    Past Surgical History:  Procedure Laterality Date   BREAST SURGERY     INCONTINENCE SURGERY     KNEE SURGERY      No Known Allergies  BP 135/88   Ht 5\' 6"  (1.676 m)   Wt (!) 301 lb (136.5 kg)   LMP 01/04/2015  BMI 48.58 kg/m       No data to display              No data to display              Objective:  Physical Exam:  Gen: NAD, comfortable in exam room  Left knee:  Inspection: No deformity, obvious swelling, erythema Palpation: No tenderness to palpation at quadriceps tendon, patella tendon, lateral and medial joint lines, tibial tuberosity, fibular head, condyles Mild tenderness to palpation along border of patella Range of Motion: Full flexion and extension Special Tests Negative ballottement test Positive fluid wave Negative Lachman's Negative anterior and posterior drawer Negative valgus and varus stress test both in 0 degrees and 15 degrees of flexion Strength: 5 out of 5 knee flexion and extension    Assessment & Plan:   Assessment & Plan Primary osteoarthritis of left knee Exam  and history today consistent with likely significant osteoarthritis of posterior patella in the setting of partial knee replacement. Suspect that she likely has mild knee effusion today based on exam; however, I I have low suspicion that this is related to gout as she is currently on treatment and has not had an improvement in her symptoms with starting her steroid course for her right great toe.    Given that the patient has not had significant relief from previous steroid injections, I do not think there is benefit in injecting or aspirating her knee today especially as she has had prior surgery and is at increased risk of infection.  Patient has exhausted conservative therapy and is currently not a surgical candidate.  Discussed with the patient that her options are limited and she must continue to work on weight loss and continue physical therapy and as needed pain management.  Patient verbalized understanding and is agreeable to plan.  Consider referral to PMR for geniculate nerve blocks as a possible future intervention.  Celine Mans, MD, PGY-2 [facility] 1:04 PM 01/05/2023

## 2023-01-05 NOTE — Progress Notes (Signed)
Established Patient Office Visit  Subjective    Patient ID: Kristen Woods, female    DOB: 12-29-1965  Age: 57 y.o. MRN: [id number]  CC:  Chief Complaint  Patient presents with   Follow-up    Refills,     HPI Kristen Woods presents for follow up of chronic med issues. Patient denies acute complaints and concerns.   Outpatient Encounter Medications as of 01/02/2023  Medication Sig   acetaminophen-codeine (TYLENOL #3) 300-30 MG tablet Take 1 tablet by mouth every 6 (six) hours as needed for moderate pain.   albuterol (VENTOLIN HFA) 108 (90 Base) MCG/ACT inhaler Inhale 2 puffs into the lungs every 6 (six) hours as needed for wheezing or shortness of breath.   cyclobenzaprine (FLEXERIL) 10 MG tablet Take 1 tablet (10 mg total) by mouth 2 (two) times daily as needed for muscle spasms.   diclofenac Sodium (VOLTAREN) 1 % GEL Apply 2 g topically 4 (four) times daily as needed.   montelukast (SINGULAIR) 10 MG tablet Take 10 mg by mouth at bedtime.   predniSONE (DELTASONE) 20 MG tablet Take 2 tablets (40 mg total) by mouth daily for 5 days.   promethazine-dextromethorphan (PROMETHAZINE-DM) 6.25-15 MG/5ML syrup Take 5 mLs by mouth 4 (four) times daily as needed for cough.   Spacer/Aero-Holding Chambers DEVI 1 Units by Does not apply route in the morning, at noon, and at bedtime.   [DISCONTINUED] diltiazem (CARDIZEM LA) 180 MG 24 hr tablet Take 1 tablet (180 mg total) by mouth daily.   [DISCONTINUED] DULoxetine (CYMBALTA) 60 MG capsule Take 1 capsule (60 mg total) by mouth daily.   [DISCONTINUED] ibuprofen (ADVIL) 800 MG tablet Take 800 mg by mouth 3 (three) times daily.   [DISCONTINUED] lisinopril-hydrochlorothiazide (ZESTORETIC) 20-25 MG tablet Take 1 tablet by mouth once daily   [DISCONTINUED] omeprazole (PRILOSEC) 40 MG capsule Take 1 capsule (40 mg total) by mouth daily.   diltiazem (CARDIZEM LA) 180 MG 24 hr tablet Take 1 tablet (180 mg total) by mouth daily.   DULoxetine (CYMBALTA)  60 MG capsule Take 1 capsule (60 mg total) by mouth daily.   ibuprofen (ADVIL) 800 MG tablet Take 1 tablet (800 mg total) by mouth 3 (three) times daily.   lisinopril-hydrochlorothiazide (ZESTORETIC) 20-25 MG tablet Take 1 tablet by mouth daily.   omeprazole (PRILOSEC) 40 MG capsule Take 1 capsule (40 mg total) by mouth daily.   No facility-administered encounter medications on file as of 01/02/2023.    Past Medical History:  Diagnosis Date   Back pain    Chronic fatigue syndrome    Depression    High cholesterol    Hypertension    Joint pain    Joint pain    Leg pain    Liver disease    Nerves    Sleep apnea    Swelling of lower extremity    Vision abnormalities     Past Surgical History:  Procedure Laterality Date   BREAST SURGERY     INCONTINENCE SURGERY     KNEE SURGERY      Family History  Problem Relation Age of Onset   Hypertension Mother    Heart disease Mother    Diabetes Father    Hyperlipidemia Other    Heart attack Neg Hx    Sudden death Neg Hx     Social History   Socioeconomic History   Marital status: Single    Spouse name: Not on file   Number of children: Not on file  Years of education: Not on file   Highest education level: Associate degree: occupational, Scientist, product/process development, or vocational program  Occupational History   Not on file  Tobacco Use   Smoking status: Never   Smokeless tobacco: Not on file  Vaping Use   Vaping status: Never Used  Substance and Sexual Activity   Alcohol use: No   Drug use: No   Sexual activity: Not on file  Other Topics Concern   Not on file  Social History Narrative   Not on file   Social Determinants of Health   Financial Resource Strain: Medium Risk (08/18/2022)   Overall Financial Resource Strain (CARDIA)    Difficulty of Paying Living Expenses: Somewhat hard  Food Insecurity: Low Risk  (10/01/2022)   [facility]   Hunger Vital Sign    Worried About Running Out of Food in  the Last Year: Never true    Ran Out of Food in the Last Year: Never true  Recent Concern: Food Insecurity - Food Insecurity Present (08/18/2022)   Hunger Vital Sign    Worried About Running Out of Food in the Last Year: Sometimes true    Ran Out of Food in the Last Year: Not on file  Transportation Needs: No Transportation Needs (10/01/2022)   [facility]   Transportation    In the past 12 months, has lack of reliable transportation kept you from medical appointments, meetings, work or from getting things needed for daily living? : No  Physical Activity: Insufficiently Active (08/18/2022)   Exercise Vital Sign    Days of Exercise per Week: 2 days    Minutes of Exercise per Session: 10 min  Stress: No Stress Concern Present (01/02/2023)   [employer] of Occupational Health - Occupational Stress Questionnaire    Feeling of Stress : Not at all  Social Connections: Socially Isolated (08/18/2022)   Social Connection and Isolation Panel [NHANES]    Frequency of Communication with Friends and Family: Once a week    Frequency of Social Gatherings with Friends and Family: Never    Attends Religious Services: 1 to 4 times per year    Active Member of [employer] or Organizations: No    Attends Banker Meetings: Not on file    Marital Status: Never married  Intimate Partner Violence: Not At Risk (01/02/2023)   Humiliation, Afraid, Rape, and Kick questionnaire    Fear of Current or Ex-Partner: No    Emotionally Abused: No    Physically Abused: No    Sexually Abused: No    Review of Systems  All other systems reviewed and are negative.       Objective    BP (!) 134/95   Pulse 76   Temp 98.7 F (37.1 C) (Oral)   Resp 18   Ht 5\' 6"  (1.676 m)   Wt (!) 309 lb 6.4 oz (140.3 kg)   LMP 01/04/2015   SpO2 93%   BMI 49.94 kg/m   Physical Exam Vitals and nursing note reviewed.  Constitutional:      General: She is not in acute distress.     Appearance: She is obese.  HENT:     Head:     Comments: Hoarseness persists    Mouth/Throat:     Mouth: Mucous membranes are moist.     Pharynx: No oropharyngeal exudate or posterior oropharyngeal erythema.  Cardiovascular:     Rate and Rhythm: Normal rate and regular rhythm.  Pulmonary:     Effort: Pulmonary effort is normal.     Breath sounds: Normal breath sounds.  Musculoskeletal:     Cervical back: Normal range of motion and neck supple.  Neurological:     General: No focal deficit present.     Mental Status: She is alert and oriented to person, place, and time.         Assessment & Plan:   Hoarseness, persistent  Acute gout involving toe of right foot, unspecified cause  Class 3 severe obesity due to excess calories with serious comorbidity and body mass index (BMI) of 45.0 to 49.9 in adult [facility])  Urinary urgency -     Ambulatory referral to Urology  Incomplete emptying of bladder -     Ambulatory referral to Urology  Essential hypertension -     Lisinopril-hydroCHLOROthiazide; Take 1 tablet by mouth daily.  Dispense: 90 tablet; Refill: 0  Gastroesophageal reflux disease without esophagitis  Anxiety and depression  Other orders -     dilTIAZem HCl ER; Take 1 tablet (180 mg total) by mouth daily.  Dispense: 90 tablet; Refill: 0 -     DULoxetine HCl; Take 1 capsule (60 mg total) by mouth daily.  Dispense: 90 capsule; Refill: 3 -     Omeprazole; Take 1 capsule (40 mg total) by mouth daily.  Dispense: 90 capsule; Refill: 3 -     Ibuprofen; Take 1 tablet (800 mg total) by mouth 3 (three) times daily.  Dispense: 60 tablet; Refill: 1     No follow-ups on file.   Tommie Raymond, MD

## 2023-01-06 ENCOUNTER — Encounter: Payer: Self-pay | Admitting: Family Medicine

## 2023-01-06 ENCOUNTER — Ambulatory Visit: Payer: Self-pay | Admitting: *Deleted

## 2023-01-06 NOTE — Telephone Encounter (Signed)
Third attempt to reach pt, voice mail full, cannot leave message to call back. Routing to practice for PCPs resolution per protocol.

## 2023-01-06 NOTE — Telephone Encounter (Signed)
Patient called, no answer, mailbox is full.   Summary: medication request   Patient called stated she still has swelling in her right foot from the gout. She is out of prednisone and would like to see if provider would call in another prescription for her. Please f/u with patient.

## 2023-01-06 NOTE — Telephone Encounter (Signed)
Summary: medication request   Patient called stated she still has swelling in her right foot from the gout. She is out of prednisone and would like to see if provider would call in another prescription for her. Please f/u with patient.

## 2023-01-08 ENCOUNTER — Telehealth: Payer: Self-pay | Admitting: *Deleted

## 2023-01-08 NOTE — Telephone Encounter (Signed)
Patient is requesting medication for her gout and swollen ankle. Patient said the medication prednisone has not fully help may need another dose.

## 2023-01-09 ENCOUNTER — Telehealth: Payer: Self-pay | Admitting: Family Medicine

## 2023-01-09 ENCOUNTER — Encounter: Payer: Medicaid Other | Admitting: Family Medicine

## 2023-01-09 NOTE — Telephone Encounter (Signed)
Per Anjelica patient was added onto 8am slot because patient was scheduled on Friday and provider missed her schedule appointment at 11am due to admin day she was already gone for the rest of the day.

## 2023-01-09 NOTE — Telephone Encounter (Signed)
Patient was given video visit with provider on 01/09/2023

## 2023-01-10 ENCOUNTER — Ambulatory Visit
Admission: EM | Admit: 2023-01-10 | Discharge: 2023-01-10 | Disposition: A | Discharge status: Home / Self Care | Payer: Medicaid Other | Attending: Internal Medicine | Admitting: Internal Medicine

## 2023-01-10 DIAGNOSIS — M109 Gout, unspecified: Secondary | ICD-10-CM | POA: Diagnosis not present

## 2023-01-10 MED ORDER — COLCHICINE 0.6 MG PO TABS
1.2000 mg | ORAL_TABLET | Freq: Every day | ORAL | 0 refills | Status: AC
Start: 2023-01-10 — End: ?

## 2023-01-10 NOTE — ED Provider Notes (Signed)
[facility]    CSN: [id number] Arrival date & time: 01/10/23  0802      History   Chief Complaint No chief complaint on file.   HPI Kristen Woods is a 57 y.o. female.   Patient presents with persistent gout flareup in her right great toe.  Patient was seen on 01/01/2023 and prescribed prednisone steroid burst with only minimal improvement.  Patient states that it typically takes about 2 months for gout flareups to resolve that she has had in the past multiple years prior.  Denies any fever.     Past Medical History:  Diagnosis Date   Back pain    Chronic fatigue syndrome    Depression    High cholesterol    Hypertension    Joint pain    Joint pain    Leg pain    Liver disease    Nerves    Sleep apnea    Swelling of lower extremity    Vision abnormalities     Patient Active Problem List   Diagnosis Date Noted   Dysphonia 06/06/2022   Hoarseness 06/06/2022   Eating disorder 05/22/2022   Pre-diabetes 02/06/2022   Other fatigue 02/04/2022   SOB (shortness of breath) on exertion 02/04/2022   Essential hypertension 02/04/2022   Health care maintenance 02/04/2022   Other hyperlipidemia 02/04/2022   Elevated glucose 02/04/2022   Vitamin D deficiency 02/04/2022   Morbid obesity (HCC) 02/04/2022   Unilateral primary osteoarthritis, right knee 12/16/2021   History of total left knee replacement 12/16/2021   Obstructive sleep apnea syndrome 03/05/2021   Stress incontinence of urine 03/05/2021   Mixed hyperlipidemia 09/01/2019   Obesity, Class III, BMI 40-49.9 (morbid obesity) (HCC) 09/01/2019   Chronic anemia 06/13/2019   DDD (degenerative disc disease), cervical 06/13/2019   DDD (degenerative disc disease), lumbosacral 06/13/2019   Peripheral edema 06/13/2019   Gastroesophageal reflux disease 06/13/2019   History of colon polyps 06/13/2019   Mild intermittent asthma 06/13/2019   Lower back pain 01/12/2015   Neck pain 10/05/2014   Left arm pain  10/05/2014   Dysesthesia 10/05/2014   Shoulder pain, left 10/05/2014   Left knee pain 08/02/2012   Right wrist pain 02/09/2012   Left leg pain 11/17/2011    Past Surgical History:  Procedure Laterality Date   BREAST SURGERY     INCONTINENCE SURGERY     KNEE SURGERY      OB History     Gravida  6   Para      Term      Preterm      AB      Living         SAB      IAB      Ectopic      Multiple      Live Births               Home Medications    Prior to Admission medications   Medication Sig Start Date End Date Taking? Authorizing Provider  colchicine 0.6 MG tablet Take 2 tablets (1.2 mg total) by mouth daily. Take 1.2 mg (2 pills) now, then 0.6 mg (1 pill) in 1 hour. 01/10/23  Yes Cattie Tineo, Rolly Salter E, FNP  acetaminophen-codeine (TYLENOL #3) 300-30 MG tablet Take 1 tablet by mouth every 6 (six) hours as needed for moderate pain. 08/13/22   Kirtland Bouchard, PA-C  albuterol (VENTOLIN HFA) 108 (90 Base) MCG/ACT inhaler Inhale 2 puffs into the lungs every  6 (six) hours as needed for wheezing or shortness of breath. 05/21/22   Ellsworth Lennox, PA-C  cyclobenzaprine (FLEXERIL) 10 MG tablet Take 1 tablet (10 mg total) by mouth 2 (two) times daily as needed for muscle spasms. 08/13/22   Kirtland Bouchard, PA-C  diclofenac Sodium (VOLTAREN) 1 % GEL Apply 2 g topically 4 (four) times daily as needed. 03/20/22   Long, Arlyss Repress, MD  diltiazem (CARDIZEM LA) 180 MG 24 hr tablet Take 1 tablet (180 mg total) by mouth daily. 01/02/23   Georganna Skeans, MD  DULoxetine (CYMBALTA) 60 MG capsule Take 1 capsule (60 mg total) by mouth daily. 01/02/23   Georganna Skeans, MD  ibuprofen (ADVIL) 800 MG tablet Take 1 tablet (800 mg total) by mouth 3 (three) times daily. 01/02/23   Georganna Skeans, MD  lisinopril-hydrochlorothiazide (ZESTORETIC) 20-25 MG tablet Take 1 tablet by mouth daily. 01/02/23   Georganna Skeans, MD  montelukast (SINGULAIR) 10 MG tablet Take 10 mg by mouth at bedtime. 08/29/22   [provider]  omeprazole (PRILOSEC) 40 MG capsule Take 1 capsule (40 mg total) by mouth daily. 01/02/23   Georganna Skeans, MD  promethazine-dextromethorphan (PROMETHAZINE-DM) 6.25-15 MG/5ML syrup Take 5 mLs by mouth 4 (four) times daily as needed for cough. 05/23/22   Cecil Cobbs, PA-C  Spacer/Aero-Holding Deretha [place] DEVI 1 Units by Does not apply route in the morning, at noon, and at bedtime. 05/21/22   Ellsworth Lennox, PA-C    Family History Family History  Problem Relation Age of Onset   Hypertension Mother    Heart disease Mother    Diabetes Father    Hyperlipidemia Other    Heart attack Neg Hx    Sudden death Neg Hx     Social History Social History   Tobacco Use   Smoking status: Never  Vaping Use   Vaping status: Never Used  Substance Use Topics   Alcohol use: No   Drug use: No     Allergies   Patient has no allergy information on record.   Review of Systems Review of Systems Per HPI  Physical Exam Triage Vital Signs ED Triage Vitals  Encounter Vitals Group     BP 01/10/23 0817 (!) 132/91     Systolic BP Percentile --      Diastolic BP Percentile --      Pulse Rate 01/10/23 0813 (!) 109     Resp --      Temp 01/10/23 0813 98 F (36.7 C)     Temp Source 01/10/23 0813 Oral     SpO2 01/10/23 0813 94 %     Weight 01/10/23 0815 (!) 309 lb (140.2 kg)     Height 01/10/23 0815 5\' 6"  (1.676 m)     Head Circumference --      Peak Flow --      Pain Score 01/10/23 0815 10     Pain Loc --      Pain Education --      Exclude from Growth Chart --    No data found.  Updated Vital Signs BP (!) 132/91 (BP Location: Left Wrist)   Pulse (!) 109   Temp 98 F (36.7 C) (Oral)   Ht 5\' 6"  (1.676 m)   Wt (!) 309 lb (140.2 kg)   LMP 01/04/2015   SpO2 94%   BMI 49.87 kg/m   Visual Acuity Right Eye Distance:   Left Eye Distance:   Bilateral Distance:    Right Eye Near:  Left Eye Near:    Bilateral Near:     Physical Exam Constitutional:      General:  She is not in acute distress.    Appearance: Normal appearance. She is not toxic-appearing or diaphoretic.  HENT:     Head: Normocephalic and atraumatic.  Eyes:     Extraocular Movements: Extraocular movements intact.     Conjunctiva/sclera: Conjunctivae normal.  Pulmonary:     Effort: Pulmonary effort is normal.  Feet:     Comments: Erythema and mild swelling present to MTP joint of right great toe.  Patient can wiggle toes.  Capillary refill and pulses intact. Neurological:     General: No focal deficit present.     Mental Status: She is alert and oriented to person, place, and time. Mental status is at baseline.  Psychiatric:        Mood and Affect: Mood normal.        Behavior: Behavior normal.        Thought Content: Thought content normal.        Judgment: Judgment normal.      UC Treatments / Results  Labs (all labs ordered are listed, but only abnormal results are displayed) Labs Reviewed - No data to display  EKG   Radiology No results found.  Procedures Procedures (including critical care time)  Medications Ordered in UC Medications - No data to display  Initial Impression / Assessment and Plan / UC Course  I have reviewed the triage vital signs and the nursing notes.  Pertinent labs & imaging results that were available during my care of the patient were reviewed by me and considered in my medical decision making (see chart for details).     Patient here with persistent acute gout flareup with minimal improvement with prednisone steroid burst.  Called Dr. Leonides Grills, supervising physician, and discussed clinical management.  Patient does take Cardizem intermittently and this can interact with colchicine.  Although, patient's creatinine clearance is normal so this should be safe and Dr. Leonides Grills was agreeable with doing normal colchicine dosing 1 hour apart.  Will avoid another steroid dose per Dr. Leonides Grills recommendation. Patient reports history of hepatitis  multiple years prior but most recent liver enzymes are normal so colchicine should be safe with this as well.  Advised patient to follow-up with PCP, urgent care, or provided phone number for podiatry if symptoms persist or worsen.  Patient verbalized understanding and was agreeable with plan. Final Clinical Impressions(s) / UC Diagnoses   Final diagnoses:  Acute gout of right foot, unspecified cause     Discharge Instructions      I have prescribed you colchicine to help alleviate gout.  If symptoms persist or worsen, please follow-up with podiatrist or PCP.    ED Prescriptions     Medication Sig Dispense Auth. Provider   colchicine 0.6 MG tablet Take 2 tablets (1.2 mg total) by mouth daily. Take 1.2 mg (2 pills) now, then 0.6 mg (1 pill) in 1 hour. 3 tablet [place], Acie Fredrickson, [place]      PDMP not reviewed this encounter.   Gustavus Bryant, [place] 01/10/23 [phone]

## 2023-01-10 NOTE — ED Triage Notes (Signed)
Patient presents with pain in her right great toe, states her pain did not go away after treatment. Patient states she soaked her foot without relief.

## 2023-01-10 NOTE — Discharge Instructions (Signed)
I have prescribed you colchicine to help alleviate gout.  If symptoms persist or worsen, please follow-up with podiatrist or PCP.

## 2023-01-12 ENCOUNTER — Ambulatory Visit: Payer: Medicaid Other | Admitting: Family Medicine

## 2023-01-13 NOTE — Progress Notes (Signed)
Pt did not come to virtual visit.

## 2023-02-13 ENCOUNTER — Ambulatory Visit: Payer: Medicaid Other | Admitting: Family Medicine

## 2023-02-13 ENCOUNTER — Encounter: Payer: Self-pay | Admitting: Family Medicine

## 2023-02-13 VITALS — BP 111/80 | HR 83 | Temp 98.6°F | Resp 18 | Ht 66.0 in | Wt 308.0 lb

## 2023-02-13 DIAGNOSIS — B349 Viral infection, unspecified: Secondary | ICD-10-CM

## 2023-02-13 DIAGNOSIS — E66813 Obesity, class 3: Secondary | ICD-10-CM

## 2023-02-13 DIAGNOSIS — Z5986 Financial insecurity: Secondary | ICD-10-CM

## 2023-02-13 DIAGNOSIS — J4521 Mild intermittent asthma with (acute) exacerbation: Secondary | ICD-10-CM | POA: Diagnosis not present

## 2023-02-13 DIAGNOSIS — R490 Dysphonia: Secondary | ICD-10-CM | POA: Diagnosis not present

## 2023-02-13 DIAGNOSIS — E6609 Other obesity due to excess calories: Secondary | ICD-10-CM

## 2023-02-13 DIAGNOSIS — I10 Essential (primary) hypertension: Secondary | ICD-10-CM

## 2023-02-13 DIAGNOSIS — Z6841 Body Mass Index (BMI) 40.0 and over, adult: Secondary | ICD-10-CM

## 2023-02-13 MED ORDER — METHYLPREDNISOLONE 4 MG PO TBPK: ORAL_TABLET | ORAL | 0 refills | Status: AC

## 2023-02-13 NOTE — Progress Notes (Unsigned)
Established Patient Office Visit  Subjective    Patient ID: Kristen Woods, female    DOB: 28-Mar-1966  Age: 57 y.o. MRN: [id number]  CC:  Chief Complaint  Patient presents with   Follow-up    Medication refills, update on throat, cold     HPI Kristen Woods presents for complaint of viral sx that are exacerbating asthma symptoms. Patient denies fever/chills.   Outpatient Encounter Medications as of 02/13/2023  Medication Sig   acetaminophen-codeine (TYLENOL #3) 300-30 MG tablet Take 1 tablet by mouth every 6 (six) hours as needed for moderate pain.   albuterol (VENTOLIN HFA) 108 (90 Base) MCG/ACT inhaler Inhale 2 puffs into the lungs every 6 (six) hours as needed for wheezing or shortness of breath.   colchicine 0.6 MG tablet Take 2 tablets (1.2 mg total) by mouth daily. Take 1.2 mg (2 pills) now, then 0.6 mg (1 pill) in 1 hour.   cyclobenzaprine (FLEXERIL) 10 MG tablet Take 1 tablet (10 mg total) by mouth 2 (two) times daily as needed for muscle spasms.   diclofenac Sodium (VOLTAREN) 1 % GEL Apply 2 g topically 4 (four) times daily as needed.   diltiazem (CARDIZEM LA) 180 MG 24 hr tablet Take 1 tablet (180 mg total) by mouth daily.   DULoxetine (CYMBALTA) 60 MG capsule Take 1 capsule (60 mg total) by mouth daily.   ibuprofen (ADVIL) 800 MG tablet Take 1 tablet (800 mg total) by mouth 3 (three) times daily.   lisinopril-hydrochlorothiazide (ZESTORETIC) 20-25 MG tablet Take 1 tablet by mouth daily.   methylPREDNISolone (MEDROL DOSEPAK) 4 MG TBPK tablet Utilize daily as directed   montelukast (SINGULAIR) 10 MG tablet Take 10 mg by mouth at bedtime.   omeprazole (PRILOSEC) 40 MG capsule Take 1 capsule (40 mg total) by mouth daily.   promethazine-dextromethorphan (PROMETHAZINE-DM) 6.25-15 MG/5ML syrup Take 5 mLs by mouth 4 (four) times daily as needed for cough.   Spacer/Aero-Holding Chambers DEVI 1 Units by Does not apply route in the morning, at noon, and at bedtime.   No  facility-administered encounter medications on file as of 02/13/2023.    Past Medical History:  Diagnosis Date   Back pain    Chronic fatigue syndrome    Depression    High cholesterol    Hypertension    Joint pain    Joint pain    Leg pain    Liver disease    Nerves    Sleep apnea    Swelling of lower extremity    Vision abnormalities     Past Surgical History:  Procedure Laterality Date   BREAST SURGERY     INCONTINENCE SURGERY     KNEE SURGERY      Family History  Problem Relation Age of Onset   Hypertension Mother    Heart disease Mother    Diabetes Father    Hyperlipidemia Other    Heart attack Neg Hx    Sudden death Neg Hx     Social History   Socioeconomic History   Marital status: Single    Spouse name: Not on file   Number of children: Not on file   Years of education: Not on file   Highest education level: Associate degree: occupational, Scientist, product/process development, or vocational program  Occupational History   Not on file  Tobacco Use   Smoking status: Never   Smokeless tobacco: Not on file  Vaping Use   Vaping status: Never Used  Substance and Sexual Activity   Alcohol  use: No   Drug use: No   Sexual activity: Not on file  Other Topics Concern   Not on file  Social History Narrative   Not on file   Social Determinants of Health   Financial Resource Strain: Medium Risk (08/18/2022)   Overall Financial Resource Strain (CARDIA)    Difficulty of Paying Living Expenses: Somewhat hard  Food Insecurity: Low Risk  (10/01/2022)   [facility]   Hunger Vital Sign    Worried About Running Out of Food in the Last Year: Never true    Ran Out of Food in the Last Year: Never true  Recent Concern: Food Insecurity - Food Insecurity Present (08/18/2022)   Hunger Vital Sign    Worried About Running Out of Food in the Last Year: Sometimes true    Ran Out of Food in the Last Year: Not on file  Transportation Needs: No Transportation Needs  (10/01/2022)   [facility]   Transportation    In the past 12 months, has lack of reliable transportation kept you from medical appointments, meetings, work or from getting things needed for daily living? : No  Physical Activity: Insufficiently Active (08/18/2022)   Exercise Vital Sign    Days of Exercise per Week: 2 days    Minutes of Exercise per Session: 10 min  Stress: No Stress Concern Present (01/02/2023)   [employer] of Occupational Health - Occupational Stress Questionnaire    Feeling of Stress : Not at all  Social Connections: Socially Isolated (08/18/2022)   Social Connection and Isolation Panel [NHANES]    Frequency of Communication with Friends and Family: Once a week    Frequency of Social Gatherings with Friends and Family: Never    Attends Religious Services: 1 to 4 times per year    Active Member of [employer] or Organizations: No    Attends Engineer, structural: Not on file    Marital Status: Never married  Intimate Partner Violence: Not At Risk (01/02/2023)   Humiliation, Afraid, Rape, and Kick questionnaire    Fear of Current or Ex-Partner: No    Emotionally Abused: No    Physically Abused: No    Sexually Abused: No    Review of Systems  Constitutional:  Negative for fever.  Respiratory:  Positive for cough, shortness of breath and wheezing.   All other systems reviewed and are negative.       Objective    BP 111/80 (BP Location: Right Arm, Patient Position: Sitting) Comment (Cuff Size): ex large  Pulse 83   Temp 98.6 F (37 C) (Oral)   Resp 18   Ht 5\' 6"  (1.676 m)   Wt (!) 308 lb (139.7 kg)   LMP 01/04/2015   SpO2 93%   BMI 49.71 kg/m   Physical Exam Vitals and nursing note reviewed.  Constitutional:      General: She is not in acute distress.    Appearance: She is obese.  HENT:     Head:     Comments: Hoarseness persists    Mouth/Throat:     Mouth: Mucous membranes are moist.     Pharynx: No  oropharyngeal exudate or posterior oropharyngeal erythema.  Cardiovascular:     Rate and Rhythm: Normal rate and regular rhythm.  Pulmonary:     Effort: Pulmonary effort is normal.     Breath sounds: Normal breath sounds. No wheezing.  Musculoskeletal:     Cervical back: Normal range  of motion and neck supple.  Neurological:     General: No focal deficit present.     Mental Status: She is alert and oriented to person, place, and time.         Assessment & Plan:   Hoarseness, persistent  Essential hypertension  Mild intermittent asthma with acute exacerbation  Viral syndrome  Other orders -     methylPREDNISolone; Utilize daily as directed  Dispense: 21 tablet; Refill: 0     Return if symptoms worsen or fail to improve.   Tommie Raymond, MD

## 2023-02-18 ENCOUNTER — Encounter: Payer: Self-pay | Admitting: Family Medicine

## 2023-05-28 ENCOUNTER — Telehealth: Payer: Self-pay | Admitting: Family Medicine

## 2023-05-28 NOTE — Telephone Encounter (Signed)
 Kristen Woods

## 2023-06-01 DIAGNOSIS — Z0271 Encounter for disability determination: Secondary | ICD-10-CM

## 2024-06-09 ENCOUNTER — Ambulatory Visit: Admitting: Family Medicine

## 2024-06-16 ENCOUNTER — Ambulatory Visit: Payer: Self-pay | Admitting: Family Medicine
